# Patient Record
Sex: Male | Born: 1970 | Race: Black or African American | Hispanic: No | Marital: Married | Smoking: Former smoker
Health system: Southern US, Community
[De-identification: ages and names within clinical notes are randomized; demographics above are authoritative.]

## PROBLEM LIST (undated history)

## (undated) DIAGNOSIS — J383 Other diseases of vocal cords: Secondary | ICD-10-CM

## (undated) DIAGNOSIS — Z923 Personal history of irradiation: Secondary | ICD-10-CM

## (undated) DIAGNOSIS — R490 Dysphonia: Secondary | ICD-10-CM

## (undated) DIAGNOSIS — C61 Malignant neoplasm of prostate: Secondary | ICD-10-CM

## (undated) DIAGNOSIS — M1990 Unspecified osteoarthritis, unspecified site: Secondary | ICD-10-CM

## (undated) DIAGNOSIS — R7303 Prediabetes: Secondary | ICD-10-CM

## (undated) DIAGNOSIS — K219 Gastro-esophageal reflux disease without esophagitis: Secondary | ICD-10-CM

## (undated) HISTORY — DX: Gastro-esophageal reflux disease without esophagitis: K21.9

## (undated) HISTORY — PX: COLONOSCOPY: SHX174

## (undated) HISTORY — PX: PROSTATE BIOPSY: SHX241

## (undated) HISTORY — PX: ESOPHAGOGASTRODUODENOSCOPY ENDOSCOPY: SHX5814

## (undated) HISTORY — DX: Personal history of irradiation: Z92.3

---

## 2017-12-05 ENCOUNTER — Emergency Department (HOSPITAL_COMMUNITY): Payer: Self-pay

## 2017-12-05 ENCOUNTER — Emergency Department (HOSPITAL_COMMUNITY)
Admission: EM | Admit: 2017-12-05 | Discharge: 2017-12-05 | Disposition: A | Discharge status: Home / Self Care | Payer: Self-pay | Attending: Emergency Medicine | Admitting: Emergency Medicine

## 2017-12-05 ENCOUNTER — Encounter (HOSPITAL_COMMUNITY): Payer: Self-pay | Admitting: Emergency Medicine

## 2017-12-05 ENCOUNTER — Other Ambulatory Visit: Payer: Self-pay

## 2017-12-05 DIAGNOSIS — R0789 Other chest pain: Secondary | ICD-10-CM | POA: Insufficient documentation

## 2017-12-05 LAB — CBC
HEMATOCRIT: 41.5 % (ref 39.0–52.0)
Hemoglobin: 13.5 g/dL (ref 13.0–17.0)
MCH: 28.8 pg (ref 26.0–34.0)
MCHC: 32.5 g/dL (ref 30.0–36.0)
MCV: 88.7 fL (ref 78.0–100.0)
Platelets: 239 10*3/uL (ref 150–400)
RBC: 4.68 MIL/uL (ref 4.22–5.81)
RDW: 13.8 % (ref 11.5–15.5)
WBC: 4 10*3/uL (ref 4.0–10.5)

## 2017-12-05 LAB — URINALYSIS, ROUTINE W REFLEX MICROSCOPIC
Bilirubin Urine: NEGATIVE
GLUCOSE, UA: NEGATIVE mg/dL
HGB URINE DIPSTICK: NEGATIVE
KETONES UR: NEGATIVE mg/dL
Leukocytes, UA: NEGATIVE
Nitrite: NEGATIVE
PROTEIN: NEGATIVE mg/dL
Specific Gravity, Urine: 1.02 (ref 1.005–1.030)
pH: 9 — ABNORMAL HIGH (ref 5.0–8.0)

## 2017-12-05 LAB — BASIC METABOLIC PANEL
ANION GAP: 11 (ref 5–15)
BUN: 8 mg/dL (ref 6–20)
CALCIUM: 9.5 mg/dL (ref 8.9–10.3)
CO2: 25 mmol/L (ref 22–32)
Chloride: 103 mmol/L (ref 101–111)
Creatinine, Ser: 1.14 mg/dL (ref 0.61–1.24)
GFR calc Af Amer: >60 mL/min (ref >60)
Glucose, Bld: 83 mg/dL (ref 65–99)
POTASSIUM: 4.1 mmol/L (ref 3.5–5.1)
SODIUM: 139 mmol/L (ref 135–145)

## 2017-12-05 LAB — D-DIMER, QUANTITATIVE (NOT AT ARMC)

## 2017-12-05 LAB — I-STAT TROPONIN, ED: Troponin i, poc: 0 ng/mL (ref 0.00–0.08)

## 2017-12-05 MED ORDER — ACETAMINOPHEN 325 MG PO TABS
650.0000 mg | ORAL_TABLET | Freq: Once | ORAL | Status: AC
  Administered 2017-12-05: 650 mg via ORAL
  Filled 2017-12-05: qty 2

## 2017-12-05 NOTE — ED Triage Notes (Signed)
Pt c/o chest discomfort, fever and urinary frequency x 1 week. Denies shortness of breath/nausea/vomiting. No other urinary symptoms.

## 2017-12-05 NOTE — ED Notes (Signed)
Pt not placed on cardiac monitor due to pt placement in hall. Intermittent Sp02 checks only.

## 2017-12-05 NOTE — ED Notes (Signed)
Dr. Sabra Heck requested a bed even if its a hallway bed now.

## 2017-12-05 NOTE — ED Notes (Signed)
Pt verbalized understanding of d/c instructions and f/u care. Pt denied any questions but stated he felt as though he had a fever. Temperature assessed prior to pt leaving ED and found to be 100.7. EDP notified.

## 2017-12-05 NOTE — ED Provider Notes (Signed)
[facility] EMERGENCY DEPARTMENT Provider Note   CSN: [id number] Arrival date & time: 12/05/17  1853     History   Chief Complaint Chief Complaint  Patient presents with  . Chest Pain  . Urinary Frequency    HPI Sandro Burgo is a 47 y.o. male.  Patient is a 47 year old male with a significant medical history for cigarette smoking but otherwise no other medical problems who is presenting today with a spinning sensation in his chest.  He states that he has had it intermittently for the last 1 week but is usually there in the morning when he wakes up.  However it occurred today when he was at work where he felt sweaty and this sensation in his chest like his heart was beating fast.  He denies a cough, chest pain, shortness of breath, nausea, vomiting or abdominal pain.  He denies any headache or neck pain.  He does drink alcohol but states only drinks on the weekend he does not drink regularly.  He has not had any sick contacts that he is aware of.   The history is provided by the patient.    History reviewed. No pertinent past medical history.  There are no active problems to display for this patient.   History reviewed. No pertinent surgical history.     Home Medications    Prior to Admission medications   Not on File    Family History No family history on file.  Social History Social History   Tobacco Use  . Smoking status: Never Smoker  . Smokeless tobacco: Never Used  Substance Use Topics  . Alcohol use: Yes  . Drug use: No     Allergies   Patient has no known allergies.   Review of Systems Review of Systems  Genitourinary: Positive for difficulty urinating.       Difficulty initiating flow of urine but no dysuria or frequency  All other systems reviewed and are negative.    Physical Exam Updated Vital Signs BP 115/78 (BP Location: Right Arm)   Pulse 73   Temp 99.5 F (37.5 C) (Oral)   Resp 16   Ht 5' 7.72" (1.72 m)    SpO2 100%   Physical Exam  Constitutional: He is oriented to person, place, and time. He appears well-developed and well-nourished. No distress.  HENT:  Head: Normocephalic and atraumatic.  Mouth/Throat: Oropharynx is clear and moist.  Eyes: Conjunctivae and EOM are normal. Pupils are equal, round, and reactive to light.  Neck: Normal range of motion. Neck supple.  Cardiovascular: Normal rate, regular rhythm and intact distal pulses.  No murmur heard. Pulmonary/Chest: Effort normal and breath sounds normal. No respiratory distress. He has no wheezes. He has no rales.  Abdominal: Soft. He exhibits no distension. There is no tenderness. There is no rebound and no guarding.  Musculoskeletal: Normal range of motion. He exhibits no edema or tenderness.  Neurological: He is alert and oriented to person, place, and time.  Skin: Skin is warm and dry. No rash noted. No erythema.  Psychiatric: He has a normal mood and affect. His behavior is normal.  Nursing note and vitals reviewed.    ED Treatments / Results  Labs (all labs ordered are listed, but only abnormal results are displayed) Labs Reviewed  URINALYSIS, ROUTINE W REFLEX MICROSCOPIC - Abnormal; Notable for the following components:      Result Value   pH 9.0 (*)    All other components within normal limits  BASIC METABOLIC PANEL  CBC  D-DIMER, QUANTITATIVE (NOT AT [facility])  I-STAT TROPONIN, ED    EKG  EKG Interpretation  Date/Time:  Monday December 05 2017 19:14:23 EST Ventricular Rate:  77 PR Interval:  144 QRS Duration: 90 QT Interval:  346 QTC Calculation: 391 R Axis:   68 Text Interpretation:  Normal sinus rhythm Normal ECG No significant change since last tracing Confirmed by Blanchie Dessert [id number]) on 12/05/2017 7:30:38 PM       Radiology Dg Chest 2 View  Result Date: 12/05/2017 CLINICAL DATA:  Chest pain for 1 week.  Shortness of breath. EXAM: CHEST  2 VIEW COMPARISON:  None. FINDINGS: The heart size and  mediastinal contours are normal. The lungs are clear. There is no pleural effusion or pneumothorax. No acute osseous findings are evident. IMPRESSION: No active cardiopulmonary process. Electronically Signed   By: Richardean Sale M.D.   On: 12/05/2017 20:05    Procedures Procedures (including critical care time)  Medications Ordered in ED Medications - No data to display   Initial Impression / Assessment and Plan / ED Course  I have reviewed the triage vital signs and the nursing notes.  Pertinent labs & imaging results that were available during my care of the patient were reviewed by me and considered in my medical decision making (see chart for details).     47 year old healthy gentleman presenting today with what he describes as a spinning sensation in his chest.  It is been present for the last 1 week intermittently but occurred at work.  He states it never happens with exertion.  Does not seem to be cardiac in nature.  Patient's EKG shows some signs most likely of early repolarization but no prior EKG.  Patient is well-appearing has no other risk factors.  Blood pressure within normal limits.  Low-grade temperature of 99.5 but again he denies any cough or other infectious symptoms.  Symptoms do not seem to be related to eating. Secondly patient is complaining of urinary symptoms which seems more significant for prostatic hypertrophy.  He has difficulty starting his stream but no dysuria or frequency.  CBC, BMP, troponin, chest x-ray, d-dimer pending.  10:57 PM Labs are wnl.  Atypical chest pain.  Low suspicion for ACS.  Possible early viral pathology.  Recommended following up with cardiology if symptoms recur.  Final Clinical Impressions(s) / ED Diagnoses   Final diagnoses:  Atypical chest pain    ED Discharge Orders    None       Blanchie Dessert, MD 12/05/17 2257

## 2018-10-11 DIAGNOSIS — J383 Other diseases of vocal cords: Secondary | ICD-10-CM

## 2018-10-11 DIAGNOSIS — R490 Dysphonia: Secondary | ICD-10-CM

## 2018-10-11 HISTORY — DX: Other diseases of vocal cords: J38.3

## 2018-10-11 HISTORY — DX: Dysphonia: R49.0

## 2018-12-08 DIAGNOSIS — Z720 Tobacco use: Secondary | ICD-10-CM | POA: Insufficient documentation

## 2019-03-16 ENCOUNTER — Ambulatory Visit: Payer: Self-pay | Admitting: Otolaryngology

## 2019-03-16 NOTE — H&P (Signed)
PREOPERATIVE H&P  Chief Complaint: hoarseness for 4 months  HPI: Juan Lewis is a 48 y.o. male who presents for evaluation of hoarseness since February. He smokes half a pack a day for over 20 years. He denies any sore throat or trouble swallowing. He underwent fiberoptic laryngoscopy in the office which revealed a right anterior vocal cord lesion. He is taken to the operating room at this time for microlaryngoscopy and excisional biopsy.  No past medical history on file. No past surgical history on file. Social History   Socioeconomic History  . Marital status: Married    Spouse name: Not on file  . Number of children: Not on file  . Years of education: Not on file  . Highest education level: Not on file  Occupational History  . Not on file  Social Needs  . Financial resource strain: Not on file  . Food insecurity:    Worry: Not on file    Inability: Not on file  . Transportation needs:    Medical: Not on file    Non-medical: Not on file  Tobacco Use  . Smoking status: Never Smoker  . Smokeless tobacco: Never Used  Substance and Sexual Activity  . Alcohol use: Yes  . Drug use: No  . Sexual activity: Not on file  Lifestyle  . Physical activity:    Days per week: Not on file    Minutes per session: Not on file  . Stress: Not on file  Relationships  . Social connections:    Talks on phone: Not on file    Gets together: Not on file    Attends religious service: Not on file    Active member of club or organization: Not on file    Attends meetings of clubs or organizations: Not on file    Relationship status: Not on file  Other Topics Concern  . Not on file  Social History Narrative  . Not on file   No family history on file. No Known Allergies Prior to Admission medications   Not on File     Positive ROS: negative  All other systems have been reviewed and were otherwise negative with the exception of those mentioned in the HPI and as above.  Physical  Exam: There were no vitals filed for this visit.  General: Alert, no acute distress Oral: Normal oral mucosa and tonsils Nasal: Clear nasal passages FOL: patient has an anterior right vocal cord lesionwith normal vocal cord mobility otherwise. Questionable neoplasia versus papilloma. Neck: No palpable adenopathy or thyroid nodules Ear: Ear canal is clear with normal appearing TMs Cardiovascular: Regular rate and rhythm, no murmur.  Respiratory: Clear to auscultation Neurologic: Alert and oriented x 3   Assessment/Plan: hoarseness Plan for Procedure(s): MICROLARYNGOSCOPY WITH EXCISION OF VOCAL CORD LESION   Melony Overly, MD 03/16/2019 4:43 PM

## 2019-03-19 ENCOUNTER — Encounter (HOSPITAL_BASED_OUTPATIENT_CLINIC_OR_DEPARTMENT_OTHER): Payer: Self-pay | Admitting: *Deleted

## 2019-03-19 ENCOUNTER — Other Ambulatory Visit: Payer: Self-pay

## 2019-03-20 ENCOUNTER — Other Ambulatory Visit (HOSPITAL_COMMUNITY)
Admission: RE | Admit: 2019-03-20 | Discharge: 2019-03-20 | Disposition: A | Discharge status: Home / Self Care | Payer: HRSA Program | Source: Ambulatory Visit | Attending: Otolaryngology | Admitting: Otolaryngology

## 2019-03-20 DIAGNOSIS — Z1159 Encounter for screening for other viral diseases: Secondary | ICD-10-CM | POA: Insufficient documentation

## 2019-03-20 DIAGNOSIS — Z01812 Encounter for preprocedural laboratory examination: Secondary | ICD-10-CM | POA: Diagnosis present

## 2019-03-21 LAB — NOVEL CORONAVIRUS, NAA (HOSP ORDER, SEND-OUT TO REF LAB; TAT 18-24 HRS): SARS-CoV-2, NAA: NOT DETECTED

## 2019-03-23 ENCOUNTER — Ambulatory Visit (HOSPITAL_BASED_OUTPATIENT_CLINIC_OR_DEPARTMENT_OTHER): Payer: Self-pay | Admitting: Anesthesiology

## 2019-03-23 ENCOUNTER — Ambulatory Visit (HOSPITAL_BASED_OUTPATIENT_CLINIC_OR_DEPARTMENT_OTHER)
Admission: RE | Admit: 2019-03-23 | Discharge: 2019-03-23 | Disposition: A | Discharge status: Home / Self Care | Payer: Self-pay | Attending: Otolaryngology | Admitting: Otolaryngology

## 2019-03-23 ENCOUNTER — Encounter (HOSPITAL_BASED_OUTPATIENT_CLINIC_OR_DEPARTMENT_OTHER): Payer: Self-pay

## 2019-03-23 ENCOUNTER — Other Ambulatory Visit: Payer: Self-pay

## 2019-03-23 ENCOUNTER — Encounter (HOSPITAL_BASED_OUTPATIENT_CLINIC_OR_DEPARTMENT_OTHER)
Admission: RE | Disposition: A | Discharge status: Home / Self Care | Payer: Self-pay | Source: Home / Self Care | Attending: Otolaryngology

## 2019-03-23 DIAGNOSIS — C320 Malignant neoplasm of glottis: Secondary | ICD-10-CM | POA: Insufficient documentation

## 2019-03-23 DIAGNOSIS — F17210 Nicotine dependence, cigarettes, uncomplicated: Secondary | ICD-10-CM | POA: Insufficient documentation

## 2019-03-23 HISTORY — PX: MICROLARYNGOSCOPY: SHX5208

## 2019-03-23 HISTORY — DX: Other diseases of vocal cords: J38.3

## 2019-03-23 HISTORY — DX: Dysphonia: R49.0

## 2019-03-23 SURGERY — MICROLARYNGOSCOPY
Anesthesia: General | Site: Throat

## 2019-03-23 MED ORDER — MIDAZOLAM HCL 2 MG/2ML IJ SOLN
1.0000 mg | INTRAMUSCULAR | Status: DC | PRN
  Administered 2019-03-23: 09:00:00 2 mg via INTRAVENOUS

## 2019-03-23 MED ORDER — EPINEPHRINE PF 1 MG/ML IJ SOLN
INTRAMUSCULAR | Status: AC
  Filled 2019-03-23: qty 1

## 2019-03-23 MED ORDER — SCOPOLAMINE 1 MG/3DAYS TD PT72: 1.0000 | MEDICATED_PATCH | Freq: Once | TRANSDERMAL | Status: DC | PRN

## 2019-03-23 MED ORDER — ROCURONIUM BROMIDE 100 MG/10ML IV SOLN
INTRAVENOUS | Status: DC | PRN
  Administered 2019-03-23: 30 mg via INTRAVENOUS

## 2019-03-23 MED ORDER — SUGAMMADEX SODIUM 200 MG/2ML IV SOLN
INTRAVENOUS | Status: DC | PRN
  Administered 2019-03-23: 200 mg via INTRAVENOUS

## 2019-03-23 MED ORDER — CEFAZOLIN SODIUM-DEXTROSE 2-4 GM/100ML-% IV SOLN
2.0000 g | INTRAVENOUS | Status: AC
  Administered 2019-03-23: 2 g via INTRAVENOUS

## 2019-03-23 MED ORDER — SUCCINYLCHOLINE CHLORIDE 20 MG/ML IJ SOLN
INTRAMUSCULAR | Status: DC | PRN
  Administered 2019-03-23: 140 mg via INTRAVENOUS

## 2019-03-23 MED ORDER — PROMETHAZINE HCL 25 MG/ML IJ SOLN: 6.2500 mg | INTRAMUSCULAR | Status: DC | PRN

## 2019-03-23 MED ORDER — CHLORHEXIDINE GLUCONATE CLOTH 2 % EX PADS: 6.0000 | MEDICATED_PAD | Freq: Once | CUTANEOUS | Status: DC

## 2019-03-23 MED ORDER — EPINEPHRINE PF 1 MG/ML IJ SOLN
INTRAMUSCULAR | Status: DC | PRN
  Administered 2019-03-23: 1 mg

## 2019-03-23 MED ORDER — FENTANYL CITRATE (PF) 100 MCG/2ML IJ SOLN
50.0000 ug | INTRAMUSCULAR | Status: DC | PRN
  Administered 2019-03-23: 100 ug via INTRAVENOUS

## 2019-03-23 MED ORDER — FENTANYL CITRATE (PF) 100 MCG/2ML IJ SOLN: 25.0000 ug | INTRAMUSCULAR | Status: DC | PRN

## 2019-03-23 MED ORDER — MEPERIDINE HCL 25 MG/ML IJ SOLN: 6.2500 mg | INTRAMUSCULAR | Status: DC | PRN

## 2019-03-23 MED ORDER — METHYLENE BLUE 0.5 % INJ SOLN
INTRAVENOUS | Status: AC
  Filled 2019-03-23: qty 10

## 2019-03-23 MED ORDER — MIDAZOLAM HCL 2 MG/2ML IJ SOLN: 0.5000 mg | Freq: Once | INTRAMUSCULAR | Status: DC | PRN

## 2019-03-23 MED ORDER — SODIUM CHLORIDE (PF) 0.9 % IJ SOLN
INTRAMUSCULAR | Status: AC
  Filled 2019-03-23: qty 10

## 2019-03-23 MED ORDER — PROPOFOL 10 MG/ML IV BOLUS
INTRAVENOUS | Status: DC | PRN
  Administered 2019-03-23: 100 mg via INTRAVENOUS

## 2019-03-23 MED ORDER — FENTANYL CITRATE (PF) 100 MCG/2ML IJ SOLN
INTRAMUSCULAR | Status: AC
  Filled 2019-03-23: qty 2

## 2019-03-23 MED ORDER — MIDAZOLAM HCL 2 MG/2ML IJ SOLN
INTRAMUSCULAR | Status: AC
  Filled 2019-03-23: qty 2

## 2019-03-23 MED ORDER — OMEPRAZOLE 40 MG PO CPDR: 40.0000 mg | DELAYED_RELEASE_CAPSULE | Freq: Every day | ORAL | 1 refills | Status: DC

## 2019-03-23 MED ORDER — DEXAMETHASONE SODIUM PHOSPHATE 4 MG/ML IJ SOLN
INTRAMUSCULAR | Status: DC | PRN
  Administered 2019-03-23: 10 mg via INTRAVENOUS

## 2019-03-23 MED ORDER — LIDOCAINE HCL (CARDIAC) PF 100 MG/5ML IV SOSY
PREFILLED_SYRINGE | INTRAVENOUS | Status: DC | PRN
  Administered 2019-03-23: 50 mg via INTRAVENOUS

## 2019-03-23 MED ORDER — LACTATED RINGERS IV SOLN
INTRAVENOUS | Status: DC
  Administered 2019-03-23: 08:00:00 via INTRAVENOUS

## 2019-03-23 SURGICAL SUPPLY — 36 items
BNDG EYE OVAL (GAUZE/BANDAGES/DRESSINGS) ×8 IMPLANT
CANISTER SUCT 1200ML W/VALVE (MISCELLANEOUS) ×4 IMPLANT
COVER WAND RF STERILE (DRAPES) IMPLANT
DEPRESSOR TONGUE BLADE STERILE (MISCELLANEOUS) ×4 IMPLANT
FILTER 7/8 IN (FILTER) IMPLANT
GAUZE SPONGE 4X4 12PLY STRL LF (GAUZE/BANDAGES/DRESSINGS) ×8 IMPLANT
GLOVE BIO SURGEON STRL SZ 6.5 (GLOVE) ×3 IMPLANT
GLOVE BIO SURGEON STRL SZ7.5 (GLOVE) ×4 IMPLANT
GLOVE BIO SURGEONS STRL SZ 6.5 (GLOVE) ×1
GLOVE BIOGEL PI IND STRL 7.0 (GLOVE) ×2 IMPLANT
GLOVE BIOGEL PI IND STRL 7.5 (GLOVE) ×4 IMPLANT
GLOVE BIOGEL PI INDICATOR 7.0 (GLOVE) ×2
GLOVE BIOGEL PI INDICATOR 7.5 (GLOVE) ×4
GLOVE SS BIOGEL STRL SZ 7.5 (GLOVE) ×2 IMPLANT
GLOVE SUPERSENSE BIOGEL SZ 7.5 (GLOVE) ×2
GOWN STRL REUS W/ TWL LRG LVL3 (GOWN DISPOSABLE) IMPLANT
GOWN STRL REUS W/ TWL XL LVL3 (GOWN DISPOSABLE) ×4 IMPLANT
GOWN STRL REUS W/TWL LRG LVL3 (GOWN DISPOSABLE)
GOWN STRL REUS W/TWL XL LVL3 (GOWN DISPOSABLE) ×4
GUARD TEETH (MISCELLANEOUS) ×4 IMPLANT
MARKER SKIN DUAL TIP RULER LAB (MISCELLANEOUS) IMPLANT
NDL SAFETY ECLIPSE 18X1.5 (NEEDLE) ×2 IMPLANT
NEEDLE HYPO 18GX1.5 SHARP (NEEDLE) ×2
NEEDLE SPNL 22GX7 QUINCKE BK (NEEDLE) IMPLANT
NS IRRIG 1000ML POUR BTL (IV SOLUTION) ×4 IMPLANT
PACK BASIN DAY SURGERY FS (CUSTOM PROCEDURE TRAY) ×4 IMPLANT
PATTIES SURGICAL .5 X3 (DISPOSABLE) ×4 IMPLANT
REDUCTION FITTING 1/4 IN (FILTER) IMPLANT
SHEET MEDIUM DRAPE 40X70 STRL (DRAPES) ×4 IMPLANT
SLEEVE SCD COMPRESS KNEE MED (MISCELLANEOUS) ×4 IMPLANT
SOLUTION BUTLER CLEAR DIP (MISCELLANEOUS) ×4 IMPLANT
SYR 5ML LL (SYRINGE) ×4 IMPLANT
SYR CONTROL 10ML LL (SYRINGE) IMPLANT
TOWEL GREEN STERILE FF (TOWEL DISPOSABLE) ×4 IMPLANT
TUBE CONNECTING 20'X1/4 (TUBING) ×2
TUBE CONNECTING 20X1/4 (TUBING) ×6 IMPLANT

## 2019-03-23 NOTE — Transfer of Care (Signed)
Immediate Anesthesia Transfer of Care Note  Patient: Juan Lewis  Procedure(s) Performed: MICROLARYNGOSCOPY WITH EXCISION OF VOCAL CORD LESION (N/A Throat)  Patient Location: PACU  Anesthesia Type:General  Level of Consciousness: awake, alert  and oriented  Airway & Oxygen Therapy: Patient Spontanous Breathing and Patient connected to face mask oxygen  Post-op Assessment: Report given to RN and Post -op Vital signs reviewed and stable  Post vital signs: Reviewed and stable  Last Vitals:  Vitals Value Taken Time  BP    Temp    Pulse 68 03/23/19 1006  Resp 15 03/23/19 1006  SpO2 100 % 03/23/19 1006  Vitals shown include unvalidated device data.  Last Pain:  Vitals:   03/23/19 0733  TempSrc: Oral  PainSc: 0-No pain         Complications: No apparent anesthesia complications

## 2019-03-23 NOTE — Brief Op Note (Signed)
03/23/2019  9:54 AM  PATIENT:  Juan Lewis  48 y.o. male  PRE-OPERATIVE DIAGNOSIS:  hoarseness  POST-OPERATIVE DIAGNOSIS:  hoarseness  PROCEDURE:  Procedure(s): MICROLARYNGOSCOPY WITH EXCISION OF VOCAL CORD LESION (N/A)  SURGEON:  Surgeon(s) and Role:    Rozetta Nunnery, MD - Primary  PHYSICIAN ASSISTANT:   ASSISTANTS: none   ANESTHESIA:   general  EBL:  minimal   BLOOD ADMINISTERED:none  DRAINS: none   LOCAL MEDICATIONS USED:  NONE  SPECIMEN:  Source of Specimen:  right vocal cord lesion  DISPOSITION OF SPECIMEN:  PATHOLOGY  COUNTS:  YES  TOURNIQUET:  * No tourniquets in log *  DICTATION: .Other Dictation: Dictation Number [id number]  PLAN OF CARE: Discharge to home after PACU  PATIENT DISPOSITION:  PACU - hemodynamically stable.   Delay start of Pharmacological VTE agent (>24hrs) due to surgical blood loss or risk of bleeding: yes

## 2019-03-23 NOTE — Discharge Instructions (Addendum)
Rest voice. Tylenol or ibuprofen prn pain Prilosec or omeprazole 40 mg qd for the next 2 weeks Take your regular meds   Post Anesthesia Home Care Instructions  Activity: Get plenty of rest for the remainder of the day. A responsible individual must stay with you for 24 hours following the procedure.  For the next 24 hours, DO NOT: -Drive a car -Paediatric nurse -Drink alcoholic beverages -Take any medication unless instructed by your physician -Make any legal decisions or sign important papers.  Meals: Start with liquid foods such as gelatin or soup. Progress to regular foods as tolerated. Avoid greasy, spicy, heavy foods. If nausea and/or vomiting occur, drink only clear liquids until the nausea and/or vomiting subsides. Call your physician if vomiting continues.  Special Instructions/Symptoms: Your throat may feel dry or sore from the anesthesia or the breathing tube placed in your throat during surgery. If this causes discomfort, gargle with warm salt water. The discomfort should disappear within 24 hours.  If you had a scopolamine patch placed behind your ear for the management of post- operative nausea and/or vomiting:  1. The medication in the patch is effective for 72 hours, after which it should be removed.  Wrap patch in a tissue and discard in the trash. Wash hands thoroughly with soap and water. 2. You may remove the patch earlier than 72 hours if you experience unpleasant side effects which may include dry mouth, dizziness or visual disturbances. 3. Avoid touching the patch. Wash your hands with soap and water after contact with the patch.

## 2019-03-23 NOTE — Anesthesia Postprocedure Evaluation (Signed)
Anesthesia Post Note  Patient: Juan Lewis  Procedure(s) Performed: MICROLARYNGOSCOPY WITH EXCISION OF VOCAL CORD LESION (N/A Throat)     Patient location during evaluation: PACU Anesthesia Type: General Level of consciousness: awake and alert, patient cooperative and oriented Pain management: pain level controlled Vital Signs Assessment: post-procedure vital signs reviewed and stable Respiratory status: spontaneous breathing, nonlabored ventilation and respiratory function stable Cardiovascular status: blood pressure returned to baseline and stable Postop Assessment: no apparent nausea or vomiting Anesthetic complications: no    Last Vitals:  Vitals:   03/23/19 1037 03/23/19 1057  BP:  (!) 143/98  Pulse: 72 70  Resp: 17 18  Temp:  36.4 C  SpO2: 97% 98%    Last Pain:  Vitals:   03/23/19 1057  TempSrc:   PainSc: 0-No pain                 Cathryne Mancebo,E. Orbin Mayeux

## 2019-03-23 NOTE — Anesthesia Procedure Notes (Signed)
Procedure Name: Intubation Date/Time: 03/23/2019 9:05 AM Performed by: Willa Frater, CRNA Pre-anesthesia Checklist: Patient identified, Emergency Drugs available, Suction available and Patient being monitored Patient Re-evaluated:Patient Re-evaluated prior to induction Oxygen Delivery Method: Circle system utilized Preoxygenation: Pre-oxygenation with 100% oxygen Induction Type: IV induction Ventilation: Mask ventilation without difficulty Laryngoscope Size: Mac and 3 Grade View: Grade I Tube type: Oral Tube size: 6.0 mm Number of attempts: 1 Airway Equipment and Method: Stylet and Oral airway Placement Confirmation: ETT inserted through vocal cords under direct vision,  positive ETCO2 and breath sounds checked- equal and bilateral Secured at: 24 cm Tube secured with: Tape Dental Injury: Teeth and Oropharynx as per pre-operative assessment

## 2019-03-23 NOTE — Anesthesia Preprocedure Evaluation (Addendum)
Anesthesia Evaluation  Patient identified by MRN, date of birth, ID band Patient awake    Reviewed: Allergy & Precautions, NPO status , Patient's Chart, lab work & pertinent test results  History of Anesthesia Complications Negative for: history of anesthetic complications  Airway Mallampati: I  TM Distance: >3 FB Neck ROM: Full    Dental  (+) Dental Advisory Given   Pulmonary Current Smoker,  Hoarseness with vocal cord lesion 03/20/2019 novel coronavirus NEG   breath sounds clear to auscultation       Cardiovascular negative cardio ROS   Rhythm:Regular Rate:Normal     Neuro/Psych negative neurological ROS     GI/Hepatic negative GI ROS, Neg liver ROS,   Endo/Other  negative endocrine ROS  Renal/GU negative Renal ROS     Musculoskeletal   Abdominal   Peds  Hematology negative hematology ROS (+)   Anesthesia Other Findings   Reproductive/Obstetrics                            Anesthesia Physical Anesthesia Plan  ASA: II  Anesthesia Plan: General   Post-op Pain Management:    Induction: Intravenous  PONV Risk Score and Plan: 1 and Ondansetron and Dexamethasone  Airway Management Planned: Oral ETT  Additional Equipment:   Intra-op Plan:   Post-operative Plan: Extubation in OR  Informed Consent: I have reviewed the patients History and Physical, chart, labs and discussed the procedure including the risks, benefits and alternatives for the proposed anesthesia with the patient or authorized representative who has indicated his/her understanding and acceptance.     Dental advisory given  Plan Discussed with: CRNA and Surgeon  Anesthesia Plan Comments: (Plan routine monitors, GETA)       Anesthesia Quick Evaluation

## 2019-03-23 NOTE — Op Note (Signed)
Juan Lewis, THAIN MEDICAL RECORD YN:[id number] ACCOUNT [id number] DATE OF BIRTH:1971/02/10 FACILITY: [facility] LOCATION: [facility] PHYSICIAN:CHRISTOPHER Lincoln Maxin, MD  OPERATIVE REPORT  DATE OF PROCEDURE:  03/23/2019  PREOPERATIVE DIAGNOSIS:  Hoarseness with right vocal cord lesion.  POSTOPERATIVE DIAGNOSIS:  Hoarseness with right vocal cord lesion.  OPERATION PERFORMED:  Microlaryngoscopy with biopsy of right vocal cord lesion.  SURGEON:  Melony Overly, MD  ANESTHESIA:  General endotracheal.  COMPLICATIONS:  None.  BRIEF CLINICAL NOTE:  The patient is a 48 year old gentleman who has been hoarse now for a little over 3 months.  On exam in the office, he has a right anterior vocal cord lesion with otherwise normal vocal cord mobility.  The lesion in the office  laryngoscopy appears exophytic or papillomatous but may represent a cancer as the patient is a smoker.  He was taken to the operating room at this time for a direct laryngoscopy and biopsy.  DESCRIPTION OF PROCEDURE:  After adequate endotracheal anesthesia, microlaryngoscopy was performed.  The base of tongue, vallecula and epiglottis were normal.  AE folds were clear.  Both piriform sinuses were clear.  Evaluation of the endolarynx:  Left  vocal cord was clear all the way up to the anterior commissure.  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  It extended all the way up to the anterior commissure but  did not really appear to cross the anterior commissure.  This was fairly bulky and extended back into the ventricle region.  Photos were obtained and a generous biopsy was obtained excising the exophytic portion of the tumor and sent to pathology.   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  Topical adrenaline was used for hemostasis.  Subglottic area was clear.  This completed the procedure.  The patient was  subsequently awoken from anesthesia and transferred to recovery  room and postop doing well.  DISPOSITION:  The patient will be discharged home later this morning on omeprazole 40 mg daily for 3 weeks.  He will be presented at tumor board and will likely require radiation therapy to treat this.  He will follow up in my office in 9-10 days for  recheck.  LN/NUANCE  D:03/23/2019 T:03/23/2019 JOB:[id number]

## 2019-03-23 NOTE — Interval H&P Note (Signed)
History and Physical Interval Note:  03/23/2019 8:42 AM  Juan Lewis  has presented today for surgery, with the diagnosis of hoarseness.  The various methods of treatment have been discussed with the patient and family. After consideration of risks, benefits and other options for treatment, the patient has consented to  Procedure(s): MICROLARYNGOSCOPY WITH EXCISION OF VOCAL CORD LESION (N/A) as a surgical intervention.  The patient's history has been reviewed, patient examined, no change in status, stable for surgery.  I have reviewed the patient's chart and labs.  Questions were answered to the patient's satisfaction.     Melony Overly

## 2019-03-26 ENCOUNTER — Encounter (HOSPITAL_BASED_OUTPATIENT_CLINIC_OR_DEPARTMENT_OTHER): Payer: Self-pay | Admitting: Otolaryngology

## 2019-03-28 ENCOUNTER — Encounter: Payer: Self-pay | Admitting: Radiation Oncology

## 2019-03-28 DIAGNOSIS — C320 Malignant neoplasm of glottis: Secondary | ICD-10-CM | POA: Insufficient documentation

## 2019-04-02 ENCOUNTER — Other Ambulatory Visit: Payer: Self-pay | Admitting: Otolaryngology

## 2019-04-02 DIAGNOSIS — C320 Malignant neoplasm of glottis: Secondary | ICD-10-CM

## 2019-04-03 ENCOUNTER — Other Ambulatory Visit: Payer: Self-pay

## 2019-04-03 ENCOUNTER — Telehealth: Payer: Self-pay | Admitting: *Deleted

## 2019-04-03 ENCOUNTER — Ambulatory Visit
Admission: RE | Admit: 2019-04-03 | Discharge: 2019-04-03 | Disposition: A | Discharge status: Home / Self Care | Payer: Self-pay | Source: Ambulatory Visit | Attending: Otolaryngology | Admitting: Otolaryngology

## 2019-04-03 DIAGNOSIS — C320 Malignant neoplasm of glottis: Secondary | ICD-10-CM

## 2019-04-03 MED ORDER — IOPAMIDOL (ISOVUE-300) INJECTION 61%
75.0000 mL | Freq: Once | INTRAVENOUS | Status: AC | PRN
  Administered 2019-04-03: 75 mL via INTRAVENOUS

## 2019-04-03 NOTE — Telephone Encounter (Signed)
Oncology Nurse Navigator Documentation  Placed introductory call to new referral patient Juan Lewis.  Unable to reach him at numbers listed, sent him secure e-mail asking him to call me.  Gayleen Orem, RN, BSN Head & Neck Oncology Nurse [place] at [place] [phone]

## 2019-04-06 ENCOUNTER — Telehealth: Payer: Self-pay | Admitting: *Deleted

## 2019-04-06 NOTE — Telephone Encounter (Signed)
Oncology Nurse Navigator Documentation  Spoke with patient wife re new referral, inability to contact husband at listed phone number.  She indicated she wd call me after 3:00 this afternoon when she returns home from work.  Addendum 3543:  No return call.  LVMM with wife indicating husband has 6/30 appt with Dr. Isidore Moos, [place] arrival to [facility]. Requested return call confirming message receipt.  Gayleen Orem, RN, BSN Head & Neck Oncology Nurse [place] at [place] [phone]

## 2019-04-09 ENCOUNTER — Telehealth: Payer: Self-pay | Admitting: *Deleted

## 2019-04-09 NOTE — Telephone Encounter (Signed)
Oncology Nurse Navigator Documentation  Placed introductory call to new referral patient Juan Lewis.  Introduced myself as the H&N oncology nurse navigator that works with Dr. Isidore Moos to whom he has been referred by Dr. Lucia Gaskins.  He confirmed understanding of referral.  Briefly explained my role as his navigator, provided my contact information.   Confirmed understanding of tomorrow's WebEx consult with Dr. Isidore Moos.   He verbalized understanding of information provided.  Gayleen Orem, RN, BSN Head & Neck Oncology Nurse [place] at [place] [phone]

## 2019-04-10 ENCOUNTER — Encounter: Payer: Self-pay | Admitting: Radiation Oncology

## 2019-04-10 ENCOUNTER — Ambulatory Visit
Admission: RE | Admit: 2019-04-10 | Discharge: 2019-04-10 | Disposition: A | Discharge status: Home / Self Care | Payer: Self-pay | Source: Ambulatory Visit | Attending: Radiation Oncology | Admitting: Radiation Oncology

## 2019-04-10 ENCOUNTER — Encounter: Payer: Self-pay | Admitting: *Deleted

## 2019-04-10 DIAGNOSIS — C320 Malignant neoplasm of glottis: Secondary | ICD-10-CM

## 2019-04-10 NOTE — Progress Notes (Signed)
Radiation Oncology         [phone] ________________________________  Initial Consultation - WEB EX  Name: Juan Lewis MRN: [id number]  Date: 04/10/2019  DOB: May 20, 1971  IO:[id number], No Pcp Per  Alphonsa Overall, MD   REFERRING PHYSICIAN: Alphonsa Overall, MD  DIAGNOSIS:    ICD-10-CM   1. 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 male who presented with hoarseness for 4 months.  Subsequently, the patient saw Dr. Lucia Gaskins who performed fiberoptic laryngoscopy which revealed a right anterior vocal cord lesion. The patient then underwent microlaryngoscopy with excision of the right vocal cord lesion. "Left vocal cord was clear all the way up to the anterior commissure.  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  It extended all the way up to the anterior commissure but did not really appear to cross the anterior commissure.  This was fairly bulky and extended back into the ventricle region." See photo below in P.E. section.  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 p16 immunohistochemistry is NEGATIVE (weak, patchy staining).  Pertinent imaging thus far includes CT Neck performed on 04/03/19 revealing the right vocal cord lesion, measuring approximately 8 x 4 mm, consistent with known squamous cell carcinoma. No evidence of metastatic disease in the neck.   CT Chest performed same day did not show any pulmonary nodules or mediastinal  adenopathy. No evidence of thoracic metastasis. I have review his images.  Pain status: none  Other symptoms: denies  Tobacco history, if any: He has smoked 1/2 pack daily for over 20 years but quit after biopsy.  ETOH abuse, if any: He drinks alcohol socially.  Prior cancers, if any: denies  PREVIOUS RADIATION THERAPY: No  PAST MEDICAL HISTORY:  has a past medical history of Hoarseness and Lesion of vocal cord.    PAST SURGICAL HISTORY: Past Surgical History:  Procedure Laterality Date  . MICROLARYNGOSCOPY N/A 03/23/2019   Procedure: MICROLARYNGOSCOPY WITH EXCISION OF VOCAL CORD LESION;  Surgeon: Rozetta Nunnery, MD;  Location: [facility];  Service: ENT;  Laterality: N/A;    FAMILY HISTORY: family history is not on file.  SOCIAL HISTORY:  reports that he has been smoking. He has been smoking about 0.50 packs per day. He has never used smokeless tobacco. He reports current alcohol use. He reports that he does not use drugs.  He has quit smoking.  ALLERGIES: Patient has no known allergies.  MEDICATIONS:  Current Outpatient Medications  Medication Sig Dispense Refill  . loratadine (CLARITIN) 10 MG tablet TK 1 T PO QD IN THE MORNING    . naproxen (NAPROSYN) 500 MG tablet TK 1 T PO BID PRN    . omeprazole (PRILOSEC) 40 MG capsule Take 1 capsule (40 mg total) by mouth daily for 14 days. 14 capsule 1   No current facility-administered medications for this encounter.     REVIEW OF SYSTEMS:  Notable for that above.  PHYSICAL EXAM:  vitals were not taken for this visit.   General: Alert and oriented, in no acute distress Psychiatric: Judgment and insight are intact. Affect is appropriate. Photo below from Dr Lucia Gaskins    ECOG = 0  0 - Asymptomatic (Fully active, able to carry on all predisease activities without restriction)  1 - Symptomatic but completely ambulatory (Restricted in physically strenuous activity but ambulatory and able to carry out work of  a light or sedentary nature. For example, light housework, office work)  2 - Symptomatic, <50% in bed during the day (Ambulatory and capable of all self care but unable to carry out any work activities. Up and about more than 50% of waking hours)  3 - Symptomatic, >50% in bed, but not bedbound (Capable of only limited self-care, confined to bed or chair 50% or more of waking hours)  4 - Bedbound (Completely disabled. Cannot carry on any self-care. Totally confined to bed or chair)  5 - Death   Eustace Pen MM, Creech RH, Tormey DC, et al. [phone]). "Toxicity and response criteria of the [facility]". [place] Oncol. 5 (6): 649-55   LABORATORY DATA:  Lab Results  Component Value Date   WBC 4.0 12/05/2017   HGB 13.5 12/05/2017   HCT 41.5 12/05/2017   MCV 88.7 12/05/2017   PLT 239 12/05/2017   CMP     Component Value Date/Time   NA 139 12/05/2017 1910   K 4.1 12/05/2017 1910   CL 103 12/05/2017 1910   CO2 25 12/05/2017 1910   GLUCOSE 83 12/05/2017 1910   BUN 8 12/05/2017 1910   CREATININE 1.14 12/05/2017 1910   CALCIUM 9.5 12/05/2017 1910   GFRNONAA >60 12/05/2017 1910   GFRAA >60 12/05/2017 1910      No results found for: TSH   RADIOGRAPHY: Ct Soft Tissue Neck W Contrast  Result Date: 04/03/2019 CLINICAL DATA:  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 EXAM: CT NECK WITH CONTRAST TECHNIQUE: Multidetector CT imaging of the neck was performed using the standard protocol following the bolus administration of intravenous contrast. CONTRAST:  35mL ISOVUE-300 IOPAMIDOL (ISOVUE-300) INJECTION 61% COMPARISON:  None. FINDINGS: Pharynx and larynx: Symmetric pharyngeal soft tissues. Approximately 8 x 4 mm enhancing lesion involving the right true vocal cord anteriorly with extension to the anterior commissure but without evidence of extension across the midline. Patent airway. Salivary glands: No inflammation, mass, or stone. Thyroid: Unremarkable. Lymph nodes: No  enlarged or suspicious lymph nodes in the neck. Vascular: Major vascular structures of the neck are patent. Limited intracranial: Unremarkable. Visualized orbits: Unremarkable. Mastoids and visualized paranasal sinuses: Mild right greater than left maxillary sinus mucosal thickening, polypoid on the right. Clear mastoid air cells. Skeleton: No suspicious osseous lesion. Upper chest: Clear lung apices. Other: None. IMPRESSION: 1. Right vocal cord lesion consistent with known squamous cell carcinoma. 2. No evidence of metastatic disease in the neck. Electronically Signed   By: Logan Bores M.D.   On: 04/03/2019 14:35   Ct Chest W Contrast  Result Date: 04/03/2019 CLINICAL DATA:  Squamous cell carcinoma RIGHT vocal cord EXAM: CT CHEST WITH CONTRAST TECHNIQUE: Multidetector CT imaging of the chest was performed during intravenous contrast administration. CONTRAST:  34mL ISOVUE-300 IOPAMIDOL (ISOVUE-300) INJECTION 61% COMPARISON:  None. FINDINGS: Cardiovascular: No significant vascular findings. Normal heart size. No pericardial effusion. Mediastinum/Nodes: No axillary or supraclavicular adenopathy. No mediastinal hilar adenopathy. Esophagus normal. Small amount of residual thymic tissue in the anterior mediastinum Lungs/Pleura: No suspicious  pulmonary nodules. Band of atelectasis in the RIGHT middle lobe. Ground-glass densities at the lung bases also favored atelectasis. Upper Abdomen: Limited view of the liver, kidneys, pancreas are unremarkable. Normal adrenal glands. Musculoskeletal: No aggressive osseous lesion. IMPRESSION: No pulmonary nodules or mediastinal adenopathy. No evidence of thoracic metastasis. Small amount residual thymus in the anterior mediastinum Electronically Signed   By: Suzy Bouchard M.D.   On: 04/03/2019 16:59      IMPRESSION/PLAN:  This is a delightful patient with head and neck cancer. I recommend radiotherapy for this patient. He was discussed at tumor board with consensus for a 6  week course of RT.  We discussed the potential risks, benefits, and side effects of radiotherapy. We talked in detail about acute and late effects. We discussed that some of the most bothersome acute effects may be mucositis, salivary changes, skin irritation, hair loss, dehydration, weight loss. We talked about late effects which include but are not necessarily limited to dysphagia, hypothyroidism, permanent injury to larynx or other soft tissue. No guarantees of treatment were given.  The patient is enthusiastic about proceeding with treatment. I look forward to participating in the patient's care.    Simulation (treatment planning) will take place in the next week  We also discussed that the treatment of head and neck cancer is a multidisciplinary process to maximize treatment outcomes and quality of life. For this reasons the following referrals have been or will be made:    Nutritionist for nutrition support during and after treatment.   Speech language pathology for swallowing and/or speech therapy.   Social work for social support.    Baseline labs including TSH.  The patient used to smoke. The patient was counseled to stop using tobacco for life to reduce cancer relapse or second cancers or intolerance to treatment.  Advised to quit ETOH or at least limit consumption.  This encounter was provided by telemedicine platform Webex. Due to pandemic risks  The patient has given verbal consent for this type of encounter and has been advised to only accept a meeting of this type in a secure network environment. The time spent during this encounter was 25 minutes. The attendants for this meeting include Eppie Gibson  and [facility].  During the encounter, Eppie Gibson was located at [facility] Radiation Oncology Department.  Juan Lewis was located at home.   __________________________________________   Eppie Gibson, MD  This document serves as a record of  services personally performed by Eppie Gibson, MD. It was created on her behalf by Rae Lips, a trained medical scribe. The creation of this record is based on the scribe's personal observations and the provider's statements to them. This document has been checked and approved by the attending provider.

## 2019-04-10 NOTE — Progress Notes (Signed)
Oncology Nurse Navigator Documentation  Met with patient during WebEx initial consult with Dr. Isidore Moos.  He was accompanied by his son Juan Lewis.   . Further introduced myself as his Navigator, explained my role as a member of the Care Team.   . Provided introductory explanation of radiation treatment including SIM planning and purpose of Aquaplast head and shoulder mask, showed them example.   [name] He reported:  Moved to [place] about 2 1/2 years ago from [place].  Transportation for appts not a problem, has own car.  Currently not working.  20 yr smoker, quit after recent bx.  He voiced understanding he will be contacted with appt for CT SIM following this morning's consult. . I encouraged them to contact me with questions/concerns as treatments/procedures begin.  They verbalized understanding of information provided.    Addendum 1318:  Called pt to inform of CT SIM tomorrow, 0800.  He confirmed understanding of [place] location, I explained arrival/registration procedures.    Gayleen Orem, RN, BSN Head & Neck Oncology Nurse [place] at [place] [phone]

## 2019-04-11 ENCOUNTER — Ambulatory Visit
Admission: RE | Admit: 2019-04-11 | Discharge: 2019-04-11 | Disposition: A | Discharge status: Home / Self Care | Payer: Self-pay | Source: Ambulatory Visit | Attending: Radiation Oncology | Admitting: Radiation Oncology

## 2019-04-11 ENCOUNTER — Other Ambulatory Visit: Payer: Self-pay

## 2019-04-11 ENCOUNTER — Encounter: Payer: Self-pay | Admitting: *Deleted

## 2019-04-11 DIAGNOSIS — C320 Malignant neoplasm of glottis: Secondary | ICD-10-CM | POA: Insufficient documentation

## 2019-04-11 NOTE — Progress Notes (Signed)
  Radiation Oncology         [phone] ________________________________  Name: Juan Lewis MRN: [id number]  Date: 04/11/2019  DOB: [birth year]  SIMULATION AND TREATMENT PLANNING NOTE  Outpatient    ICD-10-CM   1. Malignant neoplasm of glottis ([place])  C32.0     NARRATIVE:  The patient was brought to the [place].  Identity was confirmed.  All relevant records and images related to the planned course of therapy were reviewed.  The patient freely provided informed written consent to proceed with treatment after reviewing the details related to the planned course of therapy. The consent form was witnessed and verified by the simulation staff.  Oral cavity/upper throat were clear, neck without masses on exam today.  Then, the patient was set-up in a stable reproducible supine position for radiation therapy.  Aquaplast head and should mask was custom fabricated for immobilization.  CT images were obtained without contrast.  Surface markings were placed.  The CT images were loaded into the planning software.    TREATMENT PLANNING NOTE: Treatment planning then occurred.  The radiation prescription was entered and confirmed.    A total of 3 medically necessary complex treatment devices were fabricated and supervised by me (2 wedges for the opposed fields and the Aquaplast head and shoulder mask). I have requested : 3D Simulation  I have requested a DVH of the following structures: target volume, esophagus, spinal cord.  I have ordered:Nutrition Consult  The patient will receive 65.25 Gy in 29 fractions to the glottic larynx.  -----------------------------------  Eppie Gibson, MD

## 2019-04-12 ENCOUNTER — Telehealth: Payer: Self-pay | Admitting: *Deleted

## 2019-04-12 NOTE — Telephone Encounter (Signed)
[place] Work  Clinical Social Work was referred by Pension scheme manager for assessment of psychosocial needs.  Clinical Social Worker contacted patient by phone  to offer support and assess for needs.  CSW unable to reach patient, left VM requesting return call.    Gwinda Maine, LCSW  Clinical Social Worker [facility]

## 2019-04-16 NOTE — Progress Notes (Signed)
Oncology Nurse Navigator Documentation  To provide support, encouragement and care continuity, met with Mr. Gratz priot to and after CT SIM.    He tolerated procedure without difficulty, denied questions/concerns.   I toured him to [facility] 4 treatment area, explained procedures for lobby registration, arrival to Radiation Waiting, arrival to tmt area and preparation for tmt.  I encouraged him to call me with questions/concerns prior to 7/13 [place].  He voiced understanding.  Gayleen Orem, RN, BSN Head & Neck Oncology Nurse [place] at [place] [phone]

## 2019-04-23 ENCOUNTER — Ambulatory Visit
Admission: RE | Admit: 2019-04-23 | Discharge: 2019-04-23 | Disposition: A | Discharge status: Home / Self Care | Payer: Self-pay | Source: Ambulatory Visit | Attending: Radiation Oncology | Admitting: Radiation Oncology

## 2019-04-23 ENCOUNTER — Other Ambulatory Visit: Payer: Self-pay

## 2019-04-23 ENCOUNTER — Telehealth: Payer: Self-pay | Admitting: Radiation Oncology

## 2019-04-23 ENCOUNTER — Encounter: Payer: Self-pay | Admitting: *Deleted

## 2019-04-23 VITALS — BP 120/87 | HR 70 | Temp 98.7°F | Resp 20

## 2019-04-23 DIAGNOSIS — C320 Malignant neoplasm of glottis: Secondary | ICD-10-CM

## 2019-04-23 MED ORDER — SONAFINE EX EMUL
1.0000 "application " | Freq: Once | CUTANEOUS | Status: AC
  Administered 2019-04-23: 1 via TOPICAL

## 2019-04-23 NOTE — Progress Notes (Signed)

## 2019-04-24 ENCOUNTER — Other Ambulatory Visit: Payer: Self-pay

## 2019-04-24 ENCOUNTER — Inpatient Hospital Stay: Payer: Self-pay | Admitting: Nutrition

## 2019-04-24 ENCOUNTER — Ambulatory Visit
Admission: RE | Admit: 2019-04-24 | Discharge: 2019-04-24 | Disposition: A | Discharge status: Home / Self Care | Payer: Self-pay | Source: Ambulatory Visit | Attending: Radiation Oncology | Admitting: Radiation Oncology

## 2019-04-24 ENCOUNTER — Encounter: Payer: Self-pay | Admitting: *Deleted

## 2019-04-24 ENCOUNTER — Ambulatory Visit: Payer: Self-pay | Attending: Radiation Oncology

## 2019-04-24 ENCOUNTER — Inpatient Hospital Stay: Payer: Self-pay | Attending: Radiation Oncology | Admitting: Nutrition

## 2019-04-24 DIAGNOSIS — R1310 Dysphagia, unspecified: Secondary | ICD-10-CM | POA: Insufficient documentation

## 2019-04-24 NOTE — Therapy (Signed)
[facility] [address] [place], [place], [address] Phone: [phone]   Fax:  [phone]  Speech Language Pathology Evaluation  Patient Details  Name: Juan Lewis MRN: [id number] Date of Birth: 02-25-1971 Referring Provider (SLP): Eppie Gibson, MD   Encounter Date: 04/24/2019  End of Session - 04/24/19 1250    Visit Number  1    Number of Visits  7    Date for SLP Re-Evaluation  10/26/19    Authorization Type  self - pay    SLP Start Time  1105    SLP Stop Time   1145    SLP Time Calculation (min)  40 min    Activity Tolerance  Patient tolerated treatment well       Past Medical History:  Diagnosis Date  . Hoarseness   . Lesion of vocal cord     Past Surgical History:  Procedure Laterality Date  . MICROLARYNGOSCOPY N/A 03/23/2019   Procedure: MICROLARYNGOSCOPY WITH EXCISION OF VOCAL CORD LESION;  Surgeon: Rozetta Nunnery, MD;  Location: [facility] [place];  Service: ENT;  Laterality: N/A;    There were no vitals filed for this visit.  Subjective Assessment - 04/24/19 1243    Subjective  Pt denies modifying his diet in the last 6 months.    Patient is accompained by:  Family member   son Denyse Amass   Currently in Pain?  No/denies         SLP Evaluation [facility] - 04/24/19 1243      SLP Visit Information   SLP Received On  04/24/19    Referring Provider (SLP)  Eppie Gibson, MD    Onset Date  early 2020    Medical Diagnosis  glottic CA      Subjective   Patient/Family Stated Goal  maintain WNL swallowing      General Information   HPI  Pt presented to Dr. Lucia Gaskins in early 2020 with hoarseness x4 months. 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 Pt began rad yesterday 04-23-19.       Balance Screen   Has the patient fallen in the past 6 months  No      Prior Functional Status   Cognitive/Linguistic Baseline  Within functional limits      Oral Motor/Sensory Function   Overall Oral Motor/Sensory  Function  Other (comment)   not tested due to telephone visit      Interpretation of this visit was completed by pt's son Denyse Amass at the request of pt.   Pt currently tolerates regular diet; Does not report change of diet at all in the last 6 months.  Because data states the risk for dysphagia during and after radiation treatment is high due to undergoing radiation tx, SLP taught pt about the possibility of reduced/limited ability for PO intake during rad tx.   SLP educated pt re: changes to swallowing musculature after rad tx, and why adherence to dysphagia HEP provided today and PO consumption was necessary to inhibit muscular disuse atrophy and to reduce muscle fibrosis following rad tx. Pt demonstrated understanding of these things to SLP.    SLP then developed a HEP for pt and pt was instructed how to perform exercises involving lingual, vocal, and pharyngeal strengthening. SLP performed each exercise and pt return demonstrated each exercise. SLP ensured pt performance was correct prior to moving on to next exercise. Pt was instructed to complete this program 2 times a day, until 6 months after his last rad  tx, then x2 a week after that.                SLP Education - 04/24/19 1250    Education Details  HEP procedure, late effects head/neck radiation on swallow function    Person(s) Educated  Patient    Methods  Explanation    Comprehension  Verbalized understanding       SLP Short Term Goals - 04/24/19 1254      SLP SHORT TERM GOAL #1   Title  pt will tell SLP why he is completing HEP with rare min A over two sessions    Time  2    Period  --   sessions, for all STGs   Status  New      SLP SHORT TERM GOAL #2   Title  pt will demo understanding of HEP procedure over two sessions    Time  2    Status  New      SLP SHORT TERM GOAL #3   Title  pt will tell SLP of 3 overt s/sx aspiration PNA with modified independence    Time  2    Status  New       SLP Long  Term Goals - 04/24/19 1257      SLP LONG TERM GOAL #1   Title  pt will exhibit understanding of correct HEP procedure wiht rare min A over 4 visits    Time  4    Period  --   sessions, for all LTGs   Status  New      SLP LONG TERM GOAL #2   Title  pt will demo understanding of when he can decr frequency of HEP to 2-3 times/week, over two sessions    Time  5    Period  --   (sesssion #6)   Status  New       Plan - 04/24/19 1251    Clinical Impression Statement  This visit was completed via telephone at the direction of medical director Dr. Eppie Gibson due to pt safety precautions necessary from the COVID-19 pandemic. Pt has described oropharyngeal swallowing (through his son Denyse Amass) as WNL. He denies overt s/s aspiration PNA today. Pt has HEP for completion during and after treatment. See above for further details. The probability of swallowing difficulty increases dramatically with the initiation of chemo and radiation therapy. Pt will need to be followed by SLP for regular assessment of accurate HEP completion as well as for safety with POs both during and following treatment/s. Visits will be conducted via telephone or telehealth, remotely, until otherwise directed by medical director Dr. Eppie Gibson.    Speech Therapy Frequency  --   approx once every 4 weeks   Duration  --   7 total sessions   Treatment/Interventions  Aspiration precaution training;Pharyngeal strengthening exercises;Diet toleration management by SLP;Internal/external aids;Patient/family education;Compensatory strategies;SLP instruction and feedback;Cueing hierarchy;Trials of upgraded texture/liquids    Potential to Achieve Goals  Good    SLP Home Exercise Plan  provided today    Consulted and Agree with Plan of Care  Patient       Patient will benefit from skilled therapeutic intervention in order to improve the following deficits and impairments:   1. Dysphagia, unspecified type       Problem List Patient  Active Problem List   Diagnosis Date Noted  . Malignant neoplasm of glottis ([place]) 03/28/2019    [place] ,[place], [place]  04/24/2019, 12:59 PM  [facility] [address] [place], [place], [address] Phone: [phone]   Fax:  [phone]  Name: Juan Lewis MRN: [id number] Date of Birth: Jan 06, 1971

## 2019-04-24 NOTE — Patient Instructions (Signed)
SWALLOWING EXERCISES Do these until October 26, 2019, then 2 times per week afterwards  1. Effortful Swallows - Press your tongue against the roof of your mouth for 3 seconds, then squeeze the muscles in your neck while you swallow your saliva or a sip of water - Repeat 10-15 times, 2 times a day, and use whenever you eat or drink  2. Pitch Raise - Repeat "he", once per second in as high of a pitch as you can - Repeat 20 times, 2 times a day        3.   Siren exercise  - start "ah" at as low of a pitch as you can and go up to as high of a pitch as you can  - go back down as low as you can  - repeat 20 times, twice a day       4.  Super swallow  - hold your breath  - bear down   - swallow and cough immediately  - repeat 10 times, twice a day

## 2019-04-24 NOTE — Progress Notes (Signed)
48 year old male diagnosed with [place] cancer to receive 6 weeks of radiation therapy.  Past medical history includes tobacco usage.  Medications include Prilosec,  Labs were reviewed.  Height: 5 feet 7 inches. Weight: 153.8 pounds on June 12. BMI: 23.59.  Nutrition diagnosis:  Food and nutrition related knowledge deficit related to larynx cancer and associated treatments as evidenced by no prior need for nutrition related information.  Intervention: Patient educated to consume small frequent meals and snacks with adequate calories and protein. Stressed importance of weight maintenance. Questions were answered.  Teach back method used.    Monitoring, evaluation, goals: Patient will tolerate adequate calories and protein to minimize weight loss.  Next visit: Thursday, July 30.

## 2019-04-24 NOTE — Progress Notes (Signed)
Oncology Nurse Navigator Documentation  To provide support, encouragement and care continuity, met with Mr. Apfel prior to and after his initial  RT.    He completed treatment without difficulty, denied questions/concerns.  I reviewed the registration/arrival procedure for subsequent treatments.  I reviewed call times for SLP and Nutrition for tomorrow morning's Tele-H&N M[facility], provided him handouts for his reference during calls.  I encouraged him to have his son available to help with explanations.  He voiced understanding.  Gayleen Orem, RN, BSN Head & Neck Oncology Nurse [place] at [place] [phone]

## 2019-04-25 ENCOUNTER — Other Ambulatory Visit: Payer: Self-pay

## 2019-04-25 ENCOUNTER — Ambulatory Visit
Admission: RE | Admit: 2019-04-25 | Discharge: 2019-04-25 | Disposition: A | Discharge status: Home / Self Care | Payer: Self-pay | Source: Ambulatory Visit | Attending: Radiation Oncology | Admitting: Radiation Oncology

## 2019-04-26 ENCOUNTER — Ambulatory Visit
Admission: RE | Admit: 2019-04-26 | Discharge: 2019-04-26 | Disposition: A | Discharge status: Home / Self Care | Payer: Self-pay | Source: Ambulatory Visit | Attending: Radiation Oncology | Admitting: Radiation Oncology

## 2019-04-26 ENCOUNTER — Other Ambulatory Visit: Payer: Self-pay

## 2019-04-26 NOTE — Progress Notes (Signed)
Oncology Nurse Navigator Documentation  In support of COVID-19 mitigation practices, Mr. Juan Lewis participated in this morning's H&N [place] via telephone and received scheduled call(s) from Nutrition and SLP.  He will be referred to PT  post-RT for lymphedema evaluation/treatment as needed.  Gayleen Orem, RN, BSN Head & Neck Oncology Nurse [place] at [place] [phone]

## 2019-04-27 ENCOUNTER — Ambulatory Visit
Admission: RE | Admit: 2019-04-27 | Discharge: 2019-04-27 | Disposition: A | Discharge status: Home / Self Care | Payer: Self-pay | Source: Ambulatory Visit | Attending: Radiation Oncology | Admitting: Radiation Oncology

## 2019-04-27 ENCOUNTER — Other Ambulatory Visit: Payer: Self-pay

## 2019-04-30 ENCOUNTER — Other Ambulatory Visit: Payer: Self-pay | Admitting: Radiation Oncology

## 2019-04-30 ENCOUNTER — Other Ambulatory Visit: Payer: Self-pay

## 2019-04-30 ENCOUNTER — Ambulatory Visit
Admission: RE | Admit: 2019-04-30 | Discharge: 2019-04-30 | Disposition: A | Discharge status: Home / Self Care | Payer: Self-pay | Source: Ambulatory Visit | Attending: Radiation Oncology | Admitting: Radiation Oncology

## 2019-04-30 DIAGNOSIS — C320 Malignant neoplasm of glottis: Secondary | ICD-10-CM

## 2019-04-30 MED ORDER — LIDOCAINE VISCOUS HCL 2 % MT SOLN: OROMUCOSAL | 5 refills | Status: DC

## 2019-05-01 ENCOUNTER — Other Ambulatory Visit: Payer: Self-pay

## 2019-05-01 ENCOUNTER — Ambulatory Visit
Admission: RE | Admit: 2019-05-01 | Discharge: 2019-05-01 | Disposition: A | Discharge status: Home / Self Care | Payer: Self-pay | Source: Ambulatory Visit | Attending: Radiation Oncology | Admitting: Radiation Oncology

## 2019-05-02 ENCOUNTER — Ambulatory Visit
Admission: RE | Admit: 2019-05-02 | Discharge: 2019-05-02 | Disposition: A | Discharge status: Home / Self Care | Payer: Self-pay | Source: Ambulatory Visit | Attending: Radiation Oncology | Admitting: Radiation Oncology

## 2019-05-02 ENCOUNTER — Other Ambulatory Visit: Payer: Self-pay

## 2019-05-03 ENCOUNTER — Other Ambulatory Visit: Payer: Self-pay

## 2019-05-03 ENCOUNTER — Ambulatory Visit
Admission: RE | Admit: 2019-05-03 | Discharge: 2019-05-03 | Disposition: A | Discharge status: Home / Self Care | Payer: Self-pay | Source: Ambulatory Visit | Attending: Radiation Oncology | Admitting: Radiation Oncology

## 2019-05-04 ENCOUNTER — Ambulatory Visit
Admission: RE | Admit: 2019-05-04 | Discharge: 2019-05-04 | Disposition: A | Discharge status: Home / Self Care | Payer: Self-pay | Source: Ambulatory Visit | Attending: Radiation Oncology | Admitting: Radiation Oncology

## 2019-05-04 ENCOUNTER — Other Ambulatory Visit: Payer: Self-pay

## 2019-05-07 ENCOUNTER — Ambulatory Visit
Admission: RE | Admit: 2019-05-07 | Discharge: 2019-05-07 | Disposition: A | Discharge status: Home / Self Care | Payer: Self-pay | Source: Ambulatory Visit | Attending: Radiation Oncology | Admitting: Radiation Oncology

## 2019-05-07 ENCOUNTER — Other Ambulatory Visit: Payer: Self-pay

## 2019-05-08 ENCOUNTER — Ambulatory Visit
Admission: RE | Admit: 2019-05-08 | Discharge: 2019-05-08 | Disposition: A | Discharge status: Home / Self Care | Payer: Self-pay | Source: Ambulatory Visit | Attending: Radiation Oncology | Admitting: Radiation Oncology

## 2019-05-08 ENCOUNTER — Other Ambulatory Visit: Payer: Self-pay

## 2019-05-09 ENCOUNTER — Telehealth: Payer: Self-pay | Admitting: Radiation Oncology

## 2019-05-09 ENCOUNTER — Encounter: Payer: Self-pay | Admitting: *Deleted

## 2019-05-09 ENCOUNTER — Ambulatory Visit
Admission: RE | Admit: 2019-05-09 | Discharge: 2019-05-09 | Disposition: A | Discharge status: Home / Self Care | Payer: Self-pay | Source: Ambulatory Visit | Attending: Radiation Oncology | Admitting: Radiation Oncology

## 2019-05-09 ENCOUNTER — Other Ambulatory Visit: Payer: Self-pay | Admitting: Radiation Oncology

## 2019-05-09 ENCOUNTER — Other Ambulatory Visit: Payer: Self-pay

## 2019-05-09 DIAGNOSIS — C320 Malignant neoplasm of glottis: Secondary | ICD-10-CM

## 2019-05-09 MED ORDER — HYDROCODONE-ACETAMINOPHEN 7.5-325 MG/15ML PO SOLN: 10.0000 mL | ORAL | 0 refills | Status: DC | PRN

## 2019-05-09 MED ORDER — DOCUSATE SODIUM 100 MG PO CAPS: 200.0000 mg | ORAL_CAPSULE | Freq: Every day | ORAL | 1 refills | Status: DC

## 2019-05-09 NOTE — Progress Notes (Signed)
Oncology Nurse Navigator Documentation  Per patient request, prepared letter signed by Dr. Isidore Moos providing chronology of diagnostic appts/procedures, consults, radiotherapy plan.  Letter taken to [facility] 4 for his receipt tomorrow.  Gayleen Orem, RN, BSN Head & Neck Oncology Nurse [place] at [place] [phone]

## 2019-05-10 ENCOUNTER — Inpatient Hospital Stay: Payer: Self-pay | Admitting: Nutrition

## 2019-05-10 ENCOUNTER — Ambulatory Visit
Admission: RE | Admit: 2019-05-10 | Discharge: 2019-05-10 | Disposition: A | Discharge status: Home / Self Care | Payer: Self-pay | Source: Ambulatory Visit | Attending: Radiation Oncology | Admitting: Radiation Oncology

## 2019-05-10 ENCOUNTER — Other Ambulatory Visit: Payer: Self-pay

## 2019-05-10 NOTE — Progress Notes (Signed)
Nutrition follow-up completed with patient receiving radiation therapy for laryngeal cancer. Patient reports he lost 5 pounds. There is no recent weight documented in epic. Patient verbalizes he has a poor appetite which prohibits him from eating more. He denies other nutrition impact symptoms.  Nutrition diagnosis: Food and nutrition related knowledge deficit continues.  Intervention: Patient was educated to try to consume smaller amounts of food more often increasing calories and protein. Encouraged weight maintenance. Recommended patient try to add oral nutrition supplements twice daily. Provided multiple different samples along with coupons and contact information. Also provided patient with additional facts sheets on poor appetite and increasing calories and protein.  Monitoring, evaluation, goals: Patient will tolerate increased calories and protein to minimize further weight loss.  Next visit: Thursday, August 6 by telephone.  **Disclaimer: This note was dictated with voice recognition software. Similar sounding words can inadvertently be transcribed and this note may contain transcription errors which may not have been corrected upon publication of note.**

## 2019-05-11 ENCOUNTER — Ambulatory Visit
Admission: RE | Admit: 2019-05-11 | Discharge: 2019-05-11 | Disposition: A | Discharge status: Home / Self Care | Payer: Self-pay | Source: Ambulatory Visit | Attending: Radiation Oncology | Admitting: Radiation Oncology

## 2019-05-11 ENCOUNTER — Other Ambulatory Visit: Payer: Self-pay

## 2019-05-14 ENCOUNTER — Ambulatory Visit
Admission: RE | Admit: 2019-05-14 | Discharge: 2019-05-14 | Disposition: A | Discharge status: Home / Self Care | Payer: Self-pay | Source: Ambulatory Visit | Attending: Radiation Oncology | Admitting: Radiation Oncology

## 2019-05-14 ENCOUNTER — Other Ambulatory Visit: Payer: Self-pay

## 2019-05-14 DIAGNOSIS — C320 Malignant neoplasm of glottis: Secondary | ICD-10-CM | POA: Insufficient documentation

## 2019-05-14 DIAGNOSIS — Z510 Encounter for antineoplastic radiation therapy: Secondary | ICD-10-CM | POA: Insufficient documentation

## 2019-05-15 ENCOUNTER — Other Ambulatory Visit: Payer: Self-pay

## 2019-05-15 ENCOUNTER — Ambulatory Visit
Admission: RE | Admit: 2019-05-15 | Discharge: 2019-05-15 | Disposition: A | Discharge status: Home / Self Care | Payer: Self-pay | Source: Ambulatory Visit | Attending: Radiation Oncology | Admitting: Radiation Oncology

## 2019-05-16 ENCOUNTER — Ambulatory Visit
Admission: RE | Admit: 2019-05-16 | Discharge: 2019-05-16 | Disposition: A | Discharge status: Home / Self Care | Payer: Self-pay | Source: Ambulatory Visit | Attending: Radiation Oncology | Admitting: Radiation Oncology

## 2019-05-16 ENCOUNTER — Other Ambulatory Visit: Payer: Self-pay

## 2019-05-17 ENCOUNTER — Ambulatory Visit
Admission: RE | Admit: 2019-05-17 | Discharge: 2019-05-17 | Disposition: A | Discharge status: Home / Self Care | Payer: Self-pay | Source: Ambulatory Visit | Attending: Radiation Oncology | Admitting: Radiation Oncology

## 2019-05-17 ENCOUNTER — Other Ambulatory Visit: Payer: Self-pay

## 2019-05-17 ENCOUNTER — Inpatient Hospital Stay: Payer: Self-pay | Attending: Radiation Oncology | Admitting: Nutrition

## 2019-05-17 NOTE — Progress Notes (Signed)
I contacted patient by telephone.  He was not available.  I left my name and phone number and asked patient to return my call.

## 2019-05-18 ENCOUNTER — Ambulatory Visit
Admission: RE | Admit: 2019-05-18 | Discharge: 2019-05-18 | Disposition: A | Discharge status: Home / Self Care | Payer: Self-pay | Source: Ambulatory Visit | Attending: Radiation Oncology | Admitting: Radiation Oncology

## 2019-05-18 ENCOUNTER — Other Ambulatory Visit: Payer: Self-pay

## 2019-05-21 ENCOUNTER — Ambulatory Visit
Admission: RE | Admit: 2019-05-21 | Discharge: 2019-05-21 | Disposition: A | Discharge status: Home / Self Care | Payer: Self-pay | Source: Ambulatory Visit | Attending: Radiation Oncology | Admitting: Radiation Oncology

## 2019-05-21 ENCOUNTER — Other Ambulatory Visit: Payer: Self-pay

## 2019-05-21 ENCOUNTER — Other Ambulatory Visit: Payer: Self-pay | Admitting: Radiation Oncology

## 2019-05-21 DIAGNOSIS — C320 Malignant neoplasm of glottis: Secondary | ICD-10-CM

## 2019-05-21 MED ORDER — HYDROCODONE-ACETAMINOPHEN 5-325 MG PO TABS: 1.0000 | ORAL_TABLET | ORAL | 0 refills | Status: DC | PRN

## 2019-05-21 MED ORDER — LIDOCAINE VISCOUS HCL 2 % MT SOLN: OROMUCOSAL | 5 refills | Status: DC

## 2019-05-22 ENCOUNTER — Ambulatory Visit
Admission: RE | Admit: 2019-05-22 | Discharge: 2019-05-22 | Disposition: A | Discharge status: Home / Self Care | Payer: Self-pay | Source: Ambulatory Visit | Attending: Radiation Oncology | Admitting: Radiation Oncology

## 2019-05-22 ENCOUNTER — Other Ambulatory Visit: Payer: Self-pay

## 2019-05-23 ENCOUNTER — Other Ambulatory Visit: Payer: Self-pay

## 2019-05-23 ENCOUNTER — Ambulatory Visit
Admission: RE | Admit: 2019-05-23 | Discharge: 2019-05-23 | Disposition: A | Discharge status: Home / Self Care | Payer: Self-pay | Source: Ambulatory Visit | Attending: Radiation Oncology | Admitting: Radiation Oncology

## 2019-05-24 ENCOUNTER — Inpatient Hospital Stay: Payer: Self-pay | Admitting: Nutrition

## 2019-05-24 ENCOUNTER — Ambulatory Visit
Admission: RE | Admit: 2019-05-24 | Discharge: 2019-05-24 | Disposition: A | Discharge status: Home / Self Care | Payer: Self-pay | Source: Ambulatory Visit | Attending: Radiation Oncology | Admitting: Radiation Oncology

## 2019-05-24 ENCOUNTER — Other Ambulatory Visit: Payer: Self-pay

## 2019-05-24 NOTE — Progress Notes (Signed)
Patient contacted by telephone for nutrition follow up. Patient receiving radiation therapy for laryngeal cancer. Patient reports he weighed 152 pounds this week. (indicates a 1 pound weight loss over one week.) He denies nutrition impact symptoms. He enjoyed the oral nutrition supplements provided to him last week.  Nutrition Diagnosis: Food and Nutrition related knowledge deficit continues.  Intervention: Educated patient to continue strategies for adequate calorie and protein intake. We will provide 1 complementary case of Ensure Enlive for patient to pick up in radiation tomorrow.  Encourage patient to drink 2 daily. Questions answered.  Teach back method used.  Monitoring, evaluation, goals: Patient will tolerate adequate calories and protein to minimize weight loss.  Next visit: Thursday, August 20 by telephone.  **Disclaimer: This note was dictated with voice recognition software. Similar sounding words can inadvertently be transcribed and this note may contain transcription errors which may not have been corrected upon publication of note.**

## 2019-05-25 ENCOUNTER — Other Ambulatory Visit: Payer: Self-pay

## 2019-05-25 ENCOUNTER — Ambulatory Visit
Admission: RE | Admit: 2019-05-25 | Discharge: 2019-05-25 | Disposition: A | Discharge status: Home / Self Care | Payer: Self-pay | Source: Ambulatory Visit | Attending: Radiation Oncology | Admitting: Radiation Oncology

## 2019-05-28 ENCOUNTER — Other Ambulatory Visit: Payer: Self-pay | Admitting: Radiation Oncology

## 2019-05-28 ENCOUNTER — Other Ambulatory Visit: Payer: Self-pay

## 2019-05-28 ENCOUNTER — Ambulatory Visit: Payer: Self-pay | Attending: Radiation Oncology

## 2019-05-28 ENCOUNTER — Ambulatory Visit
Admission: RE | Admit: 2019-05-28 | Discharge: 2019-05-28 | Disposition: A | Discharge status: Home / Self Care | Payer: Self-pay | Source: Ambulatory Visit | Attending: Radiation Oncology | Admitting: Radiation Oncology

## 2019-05-28 DIAGNOSIS — C320 Malignant neoplasm of glottis: Secondary | ICD-10-CM

## 2019-05-28 DIAGNOSIS — R1310 Dysphagia, unspecified: Secondary | ICD-10-CM | POA: Insufficient documentation

## 2019-05-28 MED ORDER — SONAFINE EX EMUL
1.0000 "application " | Freq: Once | CUTANEOUS | Status: AC
  Administered 2019-05-28: 1 via TOPICAL

## 2019-05-28 MED ORDER — HYDROCODONE-ACETAMINOPHEN 5-325 MG PO TABS: 1.0000 | ORAL_TABLET | ORAL | 0 refills | Status: DC | PRN

## 2019-05-28 NOTE — Therapy (Signed)
[facility] [address] [place], [place], [address] Phone: [phone]   Fax:  [phone]  Speech Language Pathology Treatment  Patient Details  Name: Juan Lewis MRN: [id number] Date of Birth: 12/08/1970 Referring Provider (SLP): Eppie Gibson, MD   Encounter Date: 05/28/2019  End of Session - 05/28/19 1342    Visit Number  2    Number of Visits  7    Date for SLP Re-Evaluation  10/26/19    Authorization Type  self - pay    SLP Start Time  1104    SLP Stop Time   1140    SLP Time Calculation (min)  36 min    Activity Tolerance  Patient tolerated treatment well       Past Medical History:  Diagnosis Date  . Hoarseness   . Lesion of vocal cord     Past Surgical History:  Procedure Laterality Date  . MICROLARYNGOSCOPY N/A 03/23/2019   Procedure: MICROLARYNGOSCOPY WITH EXCISION OF VOCAL CORD LESION;  Surgeon: Rozetta Nunnery, MD;  Location: [facility];  Service: ENT;  Laterality: N/A;    There were no vitals filed for this visit.  Subjective Assessment - 05/28/19 1337    Subjective  Pt eating "soft foods".    Patient is accompained by:  Family member   son, interpreting PRN   Currently in Pain?  Yes    Pain Score  3     Pain Location  Throat    Pain Orientation  Mid    Pain Descriptors / Indicators  Sore    Pain Type  Acute pain    Pain Onset  1 to 4 weeks ago    Aggravating Factors   swallowing            ADULT SLP TREATMENT - 05/28/19 1338      General Information   Behavior/Cognition  Alert;Cooperative;Pleasant mood      Treatment Provided   Treatment provided  Dysphagia      Dysphagia Treatment   Temperature Spikes Noted  No    Respiratory Status  Room air    Treatment Methods  Skilled observation;Therapeutic exercise;Patient/caregiver education    Patient observed directly with PO's  Yes    Type of PO's observed  Dysphagia 3 (soft);Thin liquids    Liquids  provided via  --   bottle   Oral Phase Signs & Symptoms  --   none   Pharyngeal Phase Signs & Symptoms  --   none   Other treatment/comments  Pt without overt s/s aspiration or oral stage difficulties today. SLP req'd to provide mod cues usually (3/4 exercises) for proper completion of HEP. By session's end, pt performed all exercises correctly. Pt ate banana and drank H2O without any overt s/s oral or pharyngeal stage problems; pt did not report pharyngeal clearance problems when asked by SLP. He told SLP rationale for HEP with independence.       Assessment / Recommendations / Plan   Plan  Continue with current plan of care      Progression Toward Goals   Progression toward goals  Progressing toward goals       SLP Education - 05/28/19 1342    Education Details  HEP procedure    Person(s) Educated  Patient    Methods  Explanation;Demonstration    Comprehension  Verbalized understanding;Returned demonstration;Verbal cues required       SLP Short Term Goals - 05/28/19 1126  SLP SHORT TERM GOAL #1   Title  pt will tell SLP why he is completing HEP with rare min A over two sessions    Baseline  05-28-19    Time  1    Period  --   sessions, for all STGs   Status  On-going      SLP SHORT TERM GOAL #2   Title  pt will demo understanding of HEP procedure over two sessions    Time  1    Status  On-going      SLP SHORT TERM GOAL #3   Title  pt will tell SLP of 3 overt s/sx aspiration PNA with modified independence    Time  1    Status  On-going       SLP Long Term Goals - 05/28/19 1344      SLP LONG TERM GOAL #1   Title  pt will exhibit understanding of correct HEP procedure wiht rare min A over 4 visits    Time  3    Period  --   sessions, for all LTGs   Status  On-going      SLP LONG TERM GOAL #2   Title  pt will demo understanding of when he can decr frequency of HEP to 2-3 times/week, over two sessions    Time  4    Period  --   (sesssion #6)   Status   On-going       Plan - 05/28/19 1342    Clinical Impression Statement  This visit was completed via Webex at the direction of medical director Dr. Eppie Gibson due to pt safety precautions necessary from the COVID-19 pandemic. Pt has demonstrated oropharyngeal swallowing today with banana and water as WNL. He denies overt s/s aspiration PNA today. Pt has HEP for completion during and after treatment; pt req'd mod A usually for proper completion; he told SLP rationale for HEP. The probability of swallowing difficulty increases dramatically with the initiation of chemo and radiation therapy. Pt will need to be followed by SLP for regular assessment of accurate HEP completion as well as for safety with POs both during and following treatment/s. Visits will be conducted via telephone or telehealth, remotely, until otherwise directed by medical director Dr. Eppie Gibson.    Speech Therapy Frequency  --   approx once every 4 weeks   Duration  --   7 total sessions   Treatment/Interventions  Aspiration precaution training;Pharyngeal strengthening exercises;Diet toleration management by SLP;Internal/external aids;Patient/family education;Compensatory strategies;SLP instruction and feedback;Cueing hierarchy;Trials of upgraded texture/liquids    Potential to Achieve Goals  Good    SLP Home Exercise Plan  provided today    Consulted and Agree with Plan of Care  Patient       Patient will benefit from skilled therapeutic intervention in order to improve the following deficits and impairments:   1. Dysphagia, unspecified type       Problem List Patient Active Problem List   Diagnosis Date Noted  . 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

## 2019-05-29 ENCOUNTER — Other Ambulatory Visit: Payer: Self-pay

## 2019-05-29 ENCOUNTER — Ambulatory Visit
Admission: RE | Admit: 2019-05-29 | Discharge: 2019-05-29 | Disposition: A | Discharge status: Home / Self Care | Payer: Self-pay | Source: Ambulatory Visit | Attending: Radiation Oncology | Admitting: Radiation Oncology

## 2019-05-30 ENCOUNTER — Ambulatory Visit
Admission: RE | Admit: 2019-05-30 | Discharge: 2019-05-30 | Disposition: A | Discharge status: Home / Self Care | Payer: Self-pay | Source: Ambulatory Visit | Attending: Radiation Oncology | Admitting: Radiation Oncology

## 2019-05-30 ENCOUNTER — Telehealth: Payer: Self-pay | Admitting: Radiation Oncology

## 2019-05-30 ENCOUNTER — Other Ambulatory Visit: Payer: Self-pay

## 2019-05-31 ENCOUNTER — Other Ambulatory Visit: Payer: Self-pay

## 2019-05-31 ENCOUNTER — Inpatient Hospital Stay: Payer: Self-pay | Admitting: Nutrition

## 2019-05-31 ENCOUNTER — Ambulatory Visit
Admission: RE | Admit: 2019-05-31 | Discharge: 2019-05-31 | Disposition: A | Discharge status: Home / Self Care | Payer: Self-pay | Source: Ambulatory Visit | Attending: Radiation Oncology | Admitting: Radiation Oncology

## 2019-05-31 ENCOUNTER — Encounter: Payer: Self-pay | Admitting: Radiation Oncology

## 2019-05-31 NOTE — Progress Notes (Signed)
Nutrition follow-up completed with patient over the phone.   Today was patient's last radiation therapy for laryngeal cancer. Patient reports he weighed 153 pounds this week indicating weight stability. He denies nutrition impact symptoms. He reports he has been been drinking Ensure Nelson but has run out.  Nutrition diagnosis: Food and nutrition related knowledge deficit resolved.  I offered 1 complementary case of Ensure Enlive.  Patient agrees to pick up tomorrow morning around 10 AM from [facility] services. Encourage patient to contact me with any further questions or concerns.  **Disclaimer: This note was dictated with voice recognition software. Similar sounding words can inadvertently be transcribed and this note may contain transcription errors which may not have been corrected upon publication of note.**

## 2019-06-01 ENCOUNTER — Encounter: Payer: Self-pay | Admitting: *Deleted

## 2019-06-01 NOTE — Progress Notes (Signed)
Oncology Nurse Navigator Documentation  Met with Juan Lewis during final RT to offer support and to celebrate end of radiation treatment.   Provided verbal/written post-RT guidance:  Importance of keeping all follow-up appts, especially those with Nutrition and SLP.  Provided appt date/time for Follow-up with Dr. Isidore Moos.  Importance of protecting treatment area from sun.  Continuation of Sonafine application 2-3 times daily until supply exhausted after which transition to OTC lotion with vitamin E. Explained my role as navigator will continue for several more months and that I will be calling and/or joining him during follow-up visits.   I encouraged him to call me with needs/concerns.   He verbalized understanding of information provided.  Gayleen Orem, RN, BSN Head & Neck Oncology [place] at [place] [phone]

## 2019-06-12 NOTE — Progress Notes (Signed)
Juan Lewis presents for follow up of radiation completed 06/01/19 to his head and neck.   Pain issues, if any: he reports occasional pain with swallowing. He says it is slightly better today. He says it was worse this past Tuesday.  Using a feeding tube?: No Weight changes, if any:  Wt Readings from Last 3 Encounters:  06/15/19 152 lb 9.6 oz (69.2 kg)  03/23/19 153 lb 14.1 oz (69.8 kg)   Swallowing issues, if any: He is only eating softer foods. He does report a chronic loss of appetite. He does drink water while eating due to a dry mouth.  Smoking or chewing tobacco? He quit smoking at the start of radiation.  Using fluoride trays daily?  Last ENT visit was on: N/A Other notable issues, if any:  His skin has healed well.  07/02/19 ST appointment with Alba Destine.   BP 103/76   Pulse 78   Temp 99.4 F (37.4 C) (Tympanic)   Wt 152 lb 9.6 oz (69.2 kg)   SpO2 98% Comment: room air  BMI 23.40 kg/m

## 2019-06-15 ENCOUNTER — Other Ambulatory Visit: Payer: Self-pay

## 2019-06-15 ENCOUNTER — Ambulatory Visit
Admission: RE | Admit: 2019-06-15 | Discharge: 2019-06-15 | Disposition: A | Discharge status: Home / Self Care | Payer: Self-pay | Source: Ambulatory Visit | Attending: Radiation Oncology | Admitting: Radiation Oncology

## 2019-06-15 ENCOUNTER — Encounter: Payer: Self-pay | Admitting: Radiation Oncology

## 2019-06-15 DIAGNOSIS — Z923 Personal history of irradiation: Secondary | ICD-10-CM | POA: Insufficient documentation

## 2019-06-15 DIAGNOSIS — R630 Anorexia: Secondary | ICD-10-CM | POA: Insufficient documentation

## 2019-06-15 DIAGNOSIS — C320 Malignant neoplasm of glottis: Secondary | ICD-10-CM | POA: Insufficient documentation

## 2019-06-15 DIAGNOSIS — Z87891 Personal history of nicotine dependence: Secondary | ICD-10-CM | POA: Insufficient documentation

## 2019-06-15 DIAGNOSIS — Z79899 Other long term (current) drug therapy: Secondary | ICD-10-CM | POA: Insufficient documentation

## 2019-06-15 DIAGNOSIS — R682 Dry mouth, unspecified: Secondary | ICD-10-CM | POA: Insufficient documentation

## 2019-06-15 NOTE — Progress Notes (Signed)
  Patient Name: Juan Lewis MRN: [id number] DOB: 03/14/1971 Referring Physician: Lucia Gaskins DAVID (Profile Not Attached) Date of Service: 05/31/2019 [facility], [place]                                                        End Of Treatment Note  Diagnoses: C32.0-Malignant neoplasm of glottis  Cancer Staging: Stage II (cT2, cN0, cM0)  Intent: Curative  Radiation Treatment Dates: 04/23/2019 through 05/31/2019 Site Technique Total Dose (Gy) Dose per Fx Completed Fx Beam Energies  Head & neck: HN_larynx 3D 65.25 /65.25 2.25 29/29 6X   Narrative: The patient tolerated radiation therapy relatively well. He began to develop side effects about half way through treatment. He reported pain to his throat and difficulty swallowing. He also reported fatigue, thick saliva, and mouth sores. He experienced voice hoarseness and dry desquamation to his neck.  Plan: The patient will follow-up with radiation oncology in 2 weeks.  ________________________________________________   Eppie Gibson, MD  This document serves as a record of services personally performed by Eppie Gibson, MD. It was created on her behalf by Wilburn Mylar, a trained medical scribe. The creation of this record is based on the scribe's personal observations and the provider's statements to them. This document has been checked and approved by the attending provider.

## 2019-06-15 NOTE — Progress Notes (Signed)
Radiation Oncology         [phone] ________________________________  Name: Juan Lewis MRN: [id number]  Date: 06/15/2019  DOB: Oct 09, 1971  Follow-Up Visit Note, in person, outpatient   CC: Patient, No Pcp Per  Alphonsa Overall, MD  Diagnosis and Prior Radiotherapy:       ICD-10-CM   1. Malignant neoplasm of glottis ([place])  C32.0     Radiation Treatment Dates: 04/23/2019 through 05/31/2019 Site Technique Total Dose Dose per Fx Completed Fx Beam Energies  Head & neck: HN_larynx 3D 65.25/65.25 2.25 29/29 6X    CHIEF COMPLAINT:  Here for follow-up and surveillance of glottic cancer  Narrative:  The patient returns today for routine follow-up. He last saw nutrition on his final day of treatment, and he is scheduled to follow up with SLP on 07/02/2019.  Pain issues, if any: he reports occasional pain with swallowing. He says it is slightly better today. He says it was worse this past Tuesday.  Using a feeding tube?: No Weight changes, if any:  Wt Readings from Last 3 Encounters:  06/15/19 152 lb 9.6 oz (69.2 kg)  03/23/19 153 lb 14.1 oz (69.8 kg)   Swallowing issues, if any: He is only eating softer foods. He does report a chronic loss of appetite. He does drink water while eating due to a dry mouth.  Smoking or chewing tobacco? He quit smoking at the start of radiation.  Using fluoride trays daily?  Last ENT visit was on: N/A Other notable issues, if any:  His skin has healed well.   ALLERGIES:  has No Known Allergies.  Meds: Current Outpatient Medications  Medication Sig Dispense Refill  . docusate sodium (COLACE) 100 MG capsule Take 2 capsules (200 mg total) by mouth daily. This will help prevent constipation while you are taking Hycet pain medicaiton. 60 capsule 1  . lidocaine (XYLOCAINE) 2 % solution Patient: Mix 1part 2% viscous lidocaine, 1part H20. Swallow 7mL of diluted mixture, up to QID prn sore throat; may use 20 min before meals. 100 mL 5  . loratadine (CLARITIN)  10 MG tablet TK 1 T PO QD IN THE MORNING    . naproxen (NAPROSYN) 500 MG tablet TK 1 T PO BID PRN    . HYDROcodone-acetaminophen (NORCO/VICODIN) 5-325 MG tablet Take 1-2 tablets by mouth every 4 (four) hours as needed for moderate pain. (Patient not taking: Reported on 06/15/2019) 56 tablet 0  . omeprazole (PRILOSEC) 40 MG capsule Take 1 capsule (40 mg total) by mouth daily for 14 days. 14 capsule 1   No current facility-administered medications for this encounter.     Physical Findings: The patient is in no acute distress. Patient is alert and oriented. Wt Readings from Last 3 Encounters:  06/15/19 152 lb 9.6 oz (69.2 kg)  03/23/19 153 lb 14.1 oz (69.8 kg)    weight is 152 lb 9.6 oz (69.2 kg). His tympanic temperature is 99.4 F (37.4 C). His blood pressure is 103/76 and his pulse is 78. His oxygen saturation is 98%. .  General: Alert and oriented, in no acute distress HEENT: Head is normocephalic. Oropharynx is notable for no lesions Neck: Neck is notable for healing skin Skin: Skin in treatment fields shows satisfactory healing - still dry with normalizing pigment changes Psychiatric: Judgment and insight are intact. Affect is appropriate.   Lab Findings: Lab Results  Component Value Date   WBC 4.0 12/05/2017   HGB 13.5 12/05/2017   HCT 41.5 12/05/2017   MCV 88.7  12/05/2017   PLT 239 12/05/2017    No results found for: TSH  Radiographic Findings: No results found.  Impression/Plan:    1) Head and Neck Cancer Status: healing well from RT  2) Nutritional Status: stable weight PEG tube: n/a  3) Risk Factors: The patient has been educated about risk factors including alcohol and tobacco abuse; they understand that avoidance of alcohol and tobacco is important to prevent recurrences as well as other cancers - not smoking.  4) Swallowing: SLP f/u this month  5)  Thyroid function: No results found for: TSH Will check annually.  6) Other: I asked Gayleen Orem, RN, our Head  and Neck Oncology Navigator to set up f/u with Dr. Lucia Gaskins in 2 mo.  Since we are not performing laryngoscopies at the [facility] due to PPE shortage, Dr Lucia Gaskins will need to see him q 69mo for surveillance if feasible. I'll see him in a year,  7) The patient was encouraged to call with any issues or questions before then.  I spent 10 minutes face to face with the patient and more than 50% of that time was spent in counseling and/or coordination of care. _____________________________________   Eppie Gibson, MD  This document serves as a record of services personally performed by Eppie Gibson, MD. It was created on her behalf by Wilburn Mylar, a trained medical scribe. The creation of this record is based on the scribe's personal observations and the provider's statements to them. This document has been checked and approved by the attending provider.

## 2019-06-22 ENCOUNTER — Telehealth: Payer: Self-pay | Admitting: *Deleted

## 2019-06-23 NOTE — Telephone Encounter (Signed)
Oncology Nurse Navigator Documentation  Per patient's 9/4 post-treatment follow-up with Dr. Isidore Moos, called ENT Dr. Pollie Friar office to coordinate appointment.  Spoke with Gay Filler, requested mid-November f/u with Lucia Gaskins, indicated pt will need q46mo laryngoscopy as [facility] not currently conducting laryngoscopies, Dr. Isidore Moos plans to see pt in 12 months.  She verbalized understanding.  Gayleen Orem, RN, BSN Head & Neck Oncology Nurse [place] [facility] [place] [phone]

## 2019-07-02 ENCOUNTER — Other Ambulatory Visit: Payer: Self-pay

## 2019-07-02 ENCOUNTER — Ambulatory Visit: Payer: Self-pay | Attending: Radiation Oncology

## 2019-07-02 DIAGNOSIS — R1310 Dysphagia, unspecified: Secondary | ICD-10-CM | POA: Insufficient documentation

## 2019-07-02 NOTE — Therapy (Signed)
[facility] [address] [place], [place], [address] Phone: [phone]   Fax:  [phone]  Patient Details  Name: Juan Lewis MRN: [id number] Date of Birth: 02-28-1971 Referring Provider:  Eppie Gibson, MD  Encounter Date: 07/02/2019  ST - Arrive/Cancel  Pt was driving when rehab tech initiated [facility] appointment. Pt had stopped his vehicle when talking with rehab tech but had begun driving again when talking with SLP. SLP asked pt to park his car, and attempted to conduct Webex visit with pt but SLP had to repeat each utterance and rephrase at times due to language barrier. Pt alone in the car. SLP told pt SLP would call pt when he reached home (approx 5-10 minutes) and would talk with him voice to voice to set up another appointment time when son can be present.  SLP called pt at approx 11:30 and set up 07-16-19 at 11:45 AM for Webex with son, Evelena Peat. Evelena Peat was present and confirmed this time would work with him.  [facility] ,[place], [place]  07/02/2019, 12:57 PM  [facility] [address] [place], [place], [address] Phone: [phone]   Fax:  [phone]

## 2019-07-16 ENCOUNTER — Ambulatory Visit: Payer: Self-pay | Attending: Radiation Oncology

## 2019-08-11 ENCOUNTER — Encounter (INDEPENDENT_AMBULATORY_CARE_PROVIDER_SITE_OTHER): Payer: Self-pay

## 2019-08-16 ENCOUNTER — Ambulatory Visit: Payer: Self-pay | Attending: Radiation Oncology

## 2019-08-20 ENCOUNTER — Ambulatory Visit (INDEPENDENT_AMBULATORY_CARE_PROVIDER_SITE_OTHER): Payer: Self-pay | Admitting: Otolaryngology

## 2019-08-21 ENCOUNTER — Ambulatory Visit (INDEPENDENT_AMBULATORY_CARE_PROVIDER_SITE_OTHER): Payer: Self-pay | Admitting: Otolaryngology

## 2019-08-23 ENCOUNTER — Other Ambulatory Visit: Payer: Self-pay

## 2019-08-23 ENCOUNTER — Encounter (INDEPENDENT_AMBULATORY_CARE_PROVIDER_SITE_OTHER): Payer: Self-pay | Admitting: Otolaryngology

## 2019-08-23 ENCOUNTER — Ambulatory Visit (INDEPENDENT_AMBULATORY_CARE_PROVIDER_SITE_OTHER): Payer: Self-pay | Admitting: Otolaryngology

## 2019-08-23 VITALS — Temp 97.3°F

## 2019-08-23 DIAGNOSIS — Z8521 Personal history of malignant neoplasm of larynx: Secondary | ICD-10-CM

## 2019-08-23 NOTE — Progress Notes (Signed)
HPI: Juan Lewis is a 48 y.o. 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  He completed radiation therapy in August.  His voice has gradually gotten better.  He does complain of a little bit of a dry throat but no trouble swallowing. He has stopped smoking.  He inquires about drinking wine which should be okay.  Past Medical History:  Diagnosis Date  . Hoarseness   . Lesion of vocal cord    Past Surgical History:  Procedure Laterality Date  . MICROLARYNGOSCOPY N/A 03/23/2019   Procedure: MICROLARYNGOSCOPY WITH EXCISION OF VOCAL CORD LESION;  Surgeon: Rozetta Nunnery, MD;  Location: [facility];  Service: ENT;  Laterality: N/A;   Social History   Socioeconomic History  . Marital status: Married    Spouse name: Not on file  . Number of children: Not on file  . Years of education: Not on file  . Highest education level: Not on file  Occupational History  . Not on file  Social Needs  . Financial resource strain: Not on file  . Food insecurity    Worry: Not on file    Inability: Not on file  . Transportation needs    Medical: No    Non-medical: No  Tobacco Use  . Smoking status: Former Smoker    Packs/day: 0.50    Years: 30.00    Pack years: 15.00    Start date: 37    Quit date: 04/20/2019    Years since quitting: 0.3  . Smokeless tobacco: Never Used  . Tobacco comment: he quit when he started radiation. 06/15/19  Substance and Sexual Activity  . Alcohol use: Not Currently    Comment: social  . Drug use: No  . Sexual activity: Not on file  Lifestyle  . Physical activity    Days per week: Not on file    Minutes per session: Not on file  . Stress: Not on file  Relationships  . Social Herbalist on phone: Not on file    Gets together: Not on file    Attends religious service: Not on file    Active member of club or organization: Not on file    Attends meetings of clubs or  organizations: Not on file    Relationship status: Not on file  Other Topics Concern  . Not on file  Social History Narrative  . Not on file   No family history on file. No Known Allergies Prior to Admission medications   Medication Sig Start Date End Date Taking? Authorizing Provider  docusate sodium (COLACE) 100 MG capsule Take 2 capsules (200 mg total) by mouth daily. This will help prevent constipation while you are taking Hycet pain medicaiton. 05/09/19   Eppie Gibson, MD  HYDROcodone-acetaminophen (NORCO/VICODIN) 5-325 MG tablet Take 1-2 tablets by mouth every 4 (four) hours as needed for moderate pain. Patient not taking: Reported on 06/15/2019 05/28/19   Eppie Gibson, MD  lidocaine (XYLOCAINE) 2 % solution Patient: Mix 1part 2% viscous lidocaine, 1part H20. Swallow 11mL of diluted mixture, up to QID prn sore throat; may use 20 min before meals. 05/21/19   Eppie Gibson, MD  loratadine (CLARITIN) 10 MG tablet TK 1 T PO QD IN THE MORNING 02/06/19   [provider]  naproxen (NAPROSYN) 500 MG tablet TK 1 T PO BID PRN 12/08/18   [provider]  omeprazole (PRILOSEC) 40 MG capsule Take  1 capsule (40 mg total) by mouth daily for 14 days. 03/23/19 04/06/19  Rozetta Nunnery, MD     Positive ROS: Is otherwise negative.  All other systems have been reviewed and were otherwise negative with the exception of those mentioned in the HPI and as above.  Physical Exam: General: Alert, no acute distress Ears: Ear canals are clear bilaterally with intact, clear TMs Nasal: Clear nasal passages Oral: Clear oropharynx Neck: No palpable adenopathy or masses Fiberoptic laryngoscopy was performed in the office today through the left nostril.  He had moderate supraglottic edema.  Vocal cords were clear bilaterally especially the anterior vocal cords and anterior commissure.  Vocal cords had symmetric mobility.  Laryngoscopy  Date/Time: 08/23/2019 1:51 PM Performed by: Rozetta Nunnery, MD Authorized by: Rozetta Nunnery, MD   Consent:    Consent obtained:  Verbal   Consent given by:  Patient Procedure details:    Indications: oncologic surveillance follow-up     Medication:  Afrin   Instrument: flexible fiberoptic laryngoscope     Scope location: left nare   Mouth:    Oropharynx: normal     Vallecula: normal     Base of tongue: normal     Epiglottis: normal   Throat:    Right hypopharynx: normal     Left hypopharynx: normal     Pyriform sinus: normal     inflammation     True vocal cords: normal   Post-procedure details:    Patient tolerance of procedure:  Tolerated well, no immediate complications Comments:     No signs of recurrence.  Moderate supraglottic edema.    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 No evidence of recurrence  Plan: He will follow-up in 6 months for recheck   Radene Journey, MD

## 2019-09-28 DIAGNOSIS — R972 Elevated prostate specific antigen [PSA]: Secondary | ICD-10-CM | POA: Insufficient documentation

## 2019-10-12 DIAGNOSIS — C61 Malignant neoplasm of prostate: Secondary | ICD-10-CM

## 2019-10-12 HISTORY — DX: Malignant neoplasm of prostate: C61

## 2019-11-08 ENCOUNTER — Other Ambulatory Visit (HOSPITAL_COMMUNITY): Payer: Self-pay | Admitting: Urology

## 2019-11-08 ENCOUNTER — Other Ambulatory Visit: Payer: Self-pay | Admitting: Urology

## 2019-11-08 DIAGNOSIS — C61 Malignant neoplasm of prostate: Secondary | ICD-10-CM

## 2019-11-16 ENCOUNTER — Encounter (HOSPITAL_COMMUNITY)
Admission: RE | Admit: 2019-11-16 | Discharge: 2019-11-16 | Disposition: A | Discharge status: Home / Self Care | Payer: Self-pay | Source: Ambulatory Visit | Attending: Urology | Admitting: Urology

## 2019-11-16 ENCOUNTER — Other Ambulatory Visit: Payer: Self-pay

## 2019-11-16 ENCOUNTER — Ambulatory Visit (HOSPITAL_COMMUNITY)
Admission: RE | Admit: 2019-11-16 | Discharge: 2019-11-16 | Disposition: A | Discharge status: Home / Self Care | Payer: Self-pay | Source: Ambulatory Visit | Attending: Urology | Admitting: Urology

## 2019-11-16 DIAGNOSIS — C61 Malignant neoplasm of prostate: Secondary | ICD-10-CM | POA: Insufficient documentation

## 2019-11-16 LAB — POCT I-STAT CREATININE: Creatinine, Ser: 1 mg/dL (ref 0.61–1.24)

## 2019-11-16 MED ORDER — TECHNETIUM TC 99M MEDRONATE IV KIT
20.0000 | PACK | Freq: Once | INTRAVENOUS | Status: AC | PRN
  Administered 2019-11-16: 09:00:00 20 via INTRAVENOUS

## 2019-11-16 MED ORDER — IOHEXOL 300 MG/ML  SOLN
100.0000 mL | Freq: Once | INTRAMUSCULAR | Status: AC | PRN
  Administered 2019-11-16: 100 mL via INTRAVENOUS

## 2019-11-16 MED ORDER — SODIUM CHLORIDE (PF) 0.9 % IJ SOLN
INTRAMUSCULAR | Status: AC
  Filled 2019-11-16: qty 50

## 2019-11-16 MED ORDER — FLUDEOXYGLUCOSE F - 18 (FDG) INJECTION: 20.0000 | Freq: Once | INTRAVENOUS | Status: DC | PRN

## 2019-11-22 ENCOUNTER — Encounter: Payer: Self-pay | Admitting: *Deleted

## 2019-12-04 ENCOUNTER — Encounter: Payer: Self-pay | Admitting: Radiation Oncology

## 2019-12-04 ENCOUNTER — Other Ambulatory Visit: Payer: Self-pay

## 2019-12-04 ENCOUNTER — Ambulatory Visit
Admission: RE | Admit: 2019-12-04 | Discharge: 2019-12-04 | Disposition: A | Discharge status: Home / Self Care | Payer: Self-pay | Source: Ambulatory Visit | Attending: Radiation Oncology | Admitting: Radiation Oncology

## 2019-12-04 VITALS — Ht 68.0 in | Wt 164.0 lb

## 2019-12-04 DIAGNOSIS — C61 Malignant neoplasm of prostate: Secondary | ICD-10-CM | POA: Insufficient documentation

## 2019-12-04 DIAGNOSIS — C320 Malignant neoplasm of glottis: Secondary | ICD-10-CM

## 2019-12-04 HISTORY — DX: Malignant neoplasm of prostate: C61

## 2019-12-04 NOTE — Progress Notes (Signed)
Radiation Oncology         [phone] ________________________________  Initial outpatient Consultation - Conducted via Telephone due to current COVID-19 concerns for limiting patient exposure  Name: Juan Lewis MRN: [id number]  Date: 12/04/2019  DOB: [birth year]  EX:[id number], No Pcp Per  Lucas Mallow, MD   REFERRING PHYSICIAN: Marton Redwood III, MD  DIAGNOSIS: 49 y.o. gentleman with Stage T1c adenocarcinoma of the prostate with Gleason score of 4+3, and PSA of 16.8.    ICD-10-CM   1. Malignant neoplasm of prostate ([place])  C61   2. 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     HISTORY OF PRESENT ILLNESS: Juan Lewis is a 49 y.o. 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 He was previously treated with a six week course of daily radiation under the care and direction of Dr. Isidore Moos, completed in 05/2019 and has continued in surveillance with Dr. Lucia Gaskins with no evidence of recurrent disease.  He was recently noted to have an elevated PSA of 17.4 by his primary care physician and on repeat, it remained elevated at 16.8.  Accordingly, he was referred for evaluation in urology by Dr. Claudia Desanctis on 10/15/2019,  digital rectal examination was performed at that time revealing no nodules.  The patient proceeded to transrectal ultrasound with 12 biopsies of the prostate on 10/29/2019.  The prostate volume measured 38.67 cc.  Out of 12 core biopsies, 8 were positive.  The maximum Gleason score was 4+3, and this was seen in the right mid, left base (small focus), and left mid. Additionally, Gleason 3+4 was seen in the right mid lateral and Gleason 3+3 was seen in the right apex lateral, right apex, left apex lateral (two small foci), and right base (small focus).  He underwent CT abdomen/pelvis on 11/16/2019 for disease staging and this was without any evidence of visceral or osseous metastatic disease. A Bone scan performed the  same day showed likely degenerative changes within the cervical and thoracic spine without evidence of metastatic disease.  He met with Dr. Gloriann Loan on 11/21/2019 to discuss prostatectomy. His biggest concern with pursuing surgery is cost and he is therefore leaning towards proceeding with radiation for treatment of his prostate cancer.  The patient reviewed the biopsy results with his urologist and he has kindly been referred today for discussion of potential radiation treatment options.  PREVIOUS RADIATION THERAPY: Yes  04/23/2019 - 05/31/2019: Larynx / 65.25 Gy in 29 fractions (Dr. Isidore Moos)  PAST MEDICAL HISTORY:  Past Medical History:  Diagnosis Date  . Hoarseness   . Lesion of vocal cord   . Prostate cancer ([place])       PAST SURGICAL HISTORY: Past Surgical History:  Procedure Laterality Date  . MICROLARYNGOSCOPY N/A 03/23/2019   Procedure: MICROLARYNGOSCOPY WITH EXCISION OF VOCAL CORD LESION;  Surgeon: Rozetta Nunnery, MD;  Location: [place];  Service: ENT;  Laterality: N/A;  . PROSTATE BIOPSY      FAMILY HISTORY:  Family History  Problem Relation Age of Onset  . Prostate cancer Neg Hx   . Breast cancer Neg Hx   . Colon cancer Neg Hx   . Pancreatic cancer Neg Hx     SOCIAL HISTORY:  Social History   Socioeconomic History  . Marital status: Married    Spouse name: Juan Lewis   . Number of children: 4  . Years of education: Not on file  .  Highest education level: Not on file  Occupational History  . Not on file  Tobacco Use  . Smoking status: Former Smoker    Packs/day: 0.50    Years: 30.00    Pack years: 15.00    Start date: 8    Quit date: 04/20/2019    Years since quitting: 0.6  . Smokeless tobacco: Never Used  . Tobacco comment: he quit when he started radiation. 06/15/19  Substance and Sexual Activity  . Alcohol use: Not Currently    Comment: social  . Drug use: No  . Sexual activity: Not Currently  Other Topics Concern  . Not on file   Social History Narrative  . Not on file   Social Determinants of Health   Financial Resource Strain:   . Difficulty of Paying Living Expenses: Not on file  Food Insecurity:   . Worried About Charity fundraiser in the Last Year: Not on file  . Ran Out of Food in the Last Year: Not on file  Transportation Needs: No Transportation Needs  . Lack of Transportation (Medical): No  . Lack of Transportation (Non-Medical): No  Physical Activity:   . Days of Exercise per Week: Not on file  . Minutes of Exercise per Session: Not on file  Stress:   . Feeling of Stress : Not on file  Social Connections:   . Frequency of Communication with Friends and Family: Not on file  . Frequency of Social Gatherings with Friends and Family: Not on file  . Attends Religious Services: Not on file  . Active Member of Clubs or Organizations: Not on file  . Attends Archivist Meetings: Not on file  . Marital Status: Not on file  Intimate Partner Violence: Not At Risk  . Fear of Current or Ex-Partner: No  . Emotionally Abused: No  . Physically Abused: No  . Sexually Abused: No    ALLERGIES: Patient has no known allergies.  MEDICATIONS:  Current Outpatient Medications  Medication Sig Dispense Refill  . tamsulosin (FLOMAX) 0.4 MG CAPS capsule Take 0.4 mg by mouth daily.     No current facility-administered medications for this encounter.    REVIEW OF SYSTEMS:  On review of systems, the patient reports that he is doing well overall. He denies any chest pain, shortness of breath, cough, fevers, chills, night sweats, unintended weight changes. He denies any bowel disturbances, and denies abdominal pain, nausea or vomiting. He denies any new musculoskeletal or joint aches or pains. His IPSS was 7, indicating mild to moderate urinary symptoms. His SHIM was 16, indicating he has moderate erectile dysfunction. A complete review of systems is obtained and is otherwise negative.    PHYSICAL EXAM:  Wt  Readings from Last 3 Encounters:  12/04/19 164 lb (74.4 kg)  06/15/19 152 lb 9.6 oz (69.2 kg)  03/23/19 153 lb 14.1 oz (69.8 kg)   Temp Readings from Last 3 Encounters:  08/23/19 (!) 97.3 F (36.3 C) (Tympanic)  06/15/19 99.4 F (37.4 C) (Tympanic)  04/23/19 98.7 F (37.1 C) (Oral)   BP Readings from Last 3 Encounters:  06/15/19 103/76  04/23/19 120/87  03/23/19 (!) 143/98   Pulse Readings from Last 3 Encounters:  06/15/19 78  04/23/19 70  03/23/19 70   Pain Assessment Pain Score: 0-No pain/10  Physical exam not performed in light of telephone consult visit format.   KPS = 90  100 - Normal; no complaints; no evidence of disease. 90   - Able  to carry on normal activity; minor signs or symptoms of disease. 80   - Normal activity with effort; some signs or symptoms of disease. 42   - Cares for self; unable to carry on normal activity or to do active work. 60   - Requires occasional assistance, but is able to care for most of his personal needs. 50   - Requires considerable assistance and frequent medical care. 42   - Disabled; requires special care and assistance. 41   - Severely disabled; hospital admission is indicated although death not imminent. 33   - Very sick; hospital admission necessary; active supportive treatment necessary. 10   - Moribund; fatal processes progressing rapidly. 0     - Dead  Karnofsky DA, Abelmann Gates, Craver LS and Burchenal [facility] [phone]) The use of the nitrogen mustards in the palliative treatment of carcinoma: with particular reference to bronchogenic carcinoma Cancer 1 634-56  LABORATORY DATA:  Lab Results  Component Value Date   WBC 4.0 12/05/2017   HGB 13.5 12/05/2017   HCT 41.5 12/05/2017   MCV 88.7 12/05/2017   PLT 239 12/05/2017   Lab Results  Component Value Date   NA 139 12/05/2017   K 4.1 12/05/2017   CL 103 12/05/2017   CO2 25 12/05/2017   No results found for: ALT, AST, GGT, ALKPHOS, BILITOT   RADIOGRAPHY: CT ABDOMEN  PELVIS W WO CONTRAST  Result Date: 11/16/2019 CLINICAL DATA:  Prostate cancer, no history of treatment. EXAM: CT ABDOMEN AND PELVIS WITHOUT AND WITH CONTRAST TECHNIQUE: Multidetector CT imaging of the abdomen and pelvis was performed following the standard protocol before and following the bolus administration of intravenous contrast. CONTRAST:  125m OMNIPAQUE IOHEXOL 300 MG/ML  SOLN COMPARISON:  None FINDINGS: Lower chest: Similar appearance of bandlike opacity in the right middle lobe. No signs of dense consolidation or evidence of pleural effusion. Mild interstitial thickening and patchy ground-glass at the lung bases is similar to the prior study. Hepatobiliary: Cyst in the left hemi liver anteriorly. Small cyst in the right inferior [place] lobe. No signs of pericholecystic stranding. No signs of biliary ductal dilation. Pancreas: Pancreas is normal without focal lesion or ductal dilation. Spleen: Spleen is normal. Adrenals/Urinary Tract: Adrenal glands are normal size without focal lesion. The symmetric renal enhancement. No signs of hydronephrosis. No signs of nephrolithiasis. Stomach/Bowel: Stomach and small bowel with normal appearance, no signs of bowel obstruction. Normal appendix. The colon is largely stool filled. Vascular/Lymphatic: Vascular structures in the abdomen are patent. No signs of adenopathy in the retroperitoneum or upper abdomen. No signs of pelvic lymphadenopathy. Reproductive: Marked heterogeneity of the prostate which is moderately enlarged measuring 5.6 x 4.2 cm in greatest axial dimension. Other: Small fat containing umbilical hernia. No signs of bowel involvement. No signs of free air or ascites. Musculoskeletal: No acute bone finding or evidence of destructive bone process. IMPRESSION: 1. No evidence of metastatic disease in the abdomen or pelvis. 2. Marked heterogeneity of the prostate which is moderately enlarged. Right middle lobe scarring and areas of air trapping in the lung  bases with septal thickening and ground-glass, consider the possibility of previous infection and scarring or early interstitial lung disease. Consider follow-up high-resolution chest CT based on clinical symptoms. Electronically Signed   By: GZetta BillsM.D.   On: 11/16/2019 09:01   NM Bone Scan Whole Body  Result Date: 11/16/2019 CLINICAL DATA:  Prostate cancer, PSA equals 17.4 EXAM: NUCLEAR MEDICINE WHOLE BODY BONE SCAN TECHNIQUE: Whole body anterior  and posterior images were obtained approximately 3 hours after intravenous injection of radiopharmaceutical. RADIOPHARMACEUTICALS:  20 mCi Technetium-44mMDP IV COMPARISON:  04/03/2019, 11/16/2019 FINDINGS: Anterior and posterior whole body planar images are obtained after radiotracer administration. Physiologic excretion of radiotracer within the kidneys and bladder. Focal radiotracer activity is seen within the posterior elements on the left in the mid cervical spine and at the costovertebral junction at approximately T11, likely degenerative. No abnormal radiotracer deposition to suggest an acute or destructive process. No evidence of metastatic disease. IMPRESSION: 1. Likely degenerative changes within the cervical and thoracic spine as above. 2. No evidence of metastatic disease. Electronically Signed   By: MRanda NgoM.D.   On: 11/16/2019 17:02      IMPRESSION/PLAN: This visit was conducted via Telephone to spare the patient unnecessary potential exposure in the healthcare setting during the current COVID-19 pandemic. 1. 49y.o. gentleman with Stage T1c adenocarcinoma of the prostate with Gleason Score of 4+3, and PSA of 16.8. We discussed the patient's workup and outlined the nature of prostate cancer in this setting. The patient's T stage, Gleason's score, and PSA put him into the unfavorable intermediate risk group. Accordingly, he is eligible for a variety of potential treatment options including brachytherapy, 5.5 weeks of external radiation  or prostatectomy. We discussed the available radiation techniques, and focused on the details and logistics of delivery. He expressed that he has a 519year old son that he takes care of, making brachytherapy a much less appealing option due to concerns for exposure. We discussed and outlined the risks, benefits, short and long-term effects associated with radiotherapy and compared and contrasted these with prostatectomy. We discussed the role of SpaceOAR in reducing the rectal toxicity associated with radiotherapy. He was encouraged to ask questions that were answered to his stated satisfaction.  At the end of the conversation, the patient is interested in moving forward with 5.5 weeks of external beam therapy. We will share our discussion with Dr. PClaudia Desanctisand help coordinate for fiducial markers and SpaceOAR gel placement, first available,  prior to simulation, to reduce rectal toxicity from radiotherapy. He is comformtable and in agreement with the stated plan so we will move forward with treatment planning in anticipation of beginning IMRT in the near future.  Given current concerns for patient exposure during the COVID-19 pandemic, this encounter was conducted via telephone. The patient was notified in advance and was offered a MyChart meeting to allow for face to face communication but unfortunately reported that he did not have the appropriate resources/technology to support such a visit and instead preferred to proceed with telephone consult. The patient has given verbal consent for this type of encounter. The time spent during this encounter was 45 minutes. The attendants for this meeting include MTyler PitaMD, Ashlyn Bruning PA-C, [place] patient, ACallum Wolfand his wife. During the encounter, MTyler PitaMD, Ashlyn Bruning PA-C, and scribe, KWilburn Mylarwere located at [facility]  Patient, AJamale Spanglerand his wife  were located at home.    ANicholos Johns PA-C    MTyler Pita MD  [facility]Oncology Direct Dial: [phone] Fax: [phone]  Skype  LinkedIn  This document serves as a record of services personally performed by MTyler Pita MD and AFreeman Caldron PA-C. It was created on their behalf by KWilburn Mylar a trained medical scribe. The creation of this record is based on the scribe's personal observations  and the provider's statements to them. This document has been checked and approved by the attending provider.

## 2019-12-04 NOTE — Progress Notes (Signed)
GU Location of Tumor / Histology: prostatic adenocarcinoma  If Prostate Cancer, Gleason Score is (4 + 3) and PSA is (17.4) on 08/2019. Prostate volume: 38.67 grams.  Kais Freundlich with history of glottic cancer treated with xrt. Patient completed xrt under the care of Dr. Isidore Moos in 05/2019/ Patient continues to be followed by Dr. Isidore Moos and Dr. Lucia Gaskins.   During routine physical an elevated PSA of 17 noted thus patient was referred to Dr. Arnette Schaumann. After confirming prostate cancer Dr. Claudia Desanctis referred patient to Dr. Gloriann Loan to discuss surgery then Dr. Tammi Klippel to discuss radiation.  Biopsies of prostate (if applicable) revealed:    Past/Anticipated interventions by urology, if any: prostate biopsy, CT abd/pelvis (negative), bone scan (negative), referral to Dr. Gloriann Loan to discuss prostatectomy, referral to Dr. Tammi Klippel to discuss radiotherapy options  Past/Anticipated interventions by medical oncology, if any: none  Weight changes, if any: denies  Bowel/Bladder complaints, if any: IPSS 7. SHIM 16. Denies dysuria, hematuria, urinary leakage or incontinence. Denies any bowel complaints.   Nausea/Vomiting, if any: denies  Pain issues, if any:  denies  SAFETY ISSUES:  Prior radiation? Yes to throat  Pacemaker/ICD? no  Possible current pregnancy? no, male patient  Is the patient on methotrexate? no  Current Complaints / other details:  49 year old male. Married. Social drinker.

## 2019-12-11 ENCOUNTER — Encounter: Payer: Self-pay | Admitting: Medical Oncology

## 2019-12-11 ENCOUNTER — Telehealth: Payer: Self-pay | Admitting: *Deleted

## 2019-12-11 NOTE — Telephone Encounter (Signed)
Called patient to inform that I called [facility] and am waiting for them to call me with an appt. date and time for fid. Markers and space oar gel placement, lvm for a return call

## 2019-12-25 ENCOUNTER — Telehealth: Payer: Self-pay | Admitting: Radiation Oncology

## 2019-12-25 ENCOUNTER — Telehealth: Payer: Self-pay | Admitting: *Deleted

## 2019-12-25 ENCOUNTER — Telehealth: Payer: Self-pay | Admitting: Medical Oncology

## 2019-12-25 NOTE — Telephone Encounter (Signed)
Returned call to patient. He is concerned because he has not been given an appointment to get his gold markers and SpaceOar. He is scheduled 4/22 @ 1:30 pm at [facility]. He wrote this appointment down and voiced back to me. I told him he should received a call or letter with instructions for this procedure. I informed him, Enid Derry will call him with appointment for CT simulation. I asked him to call me with questions or concerns. He voiced understanding.

## 2019-12-25 NOTE — Telephone Encounter (Signed)
Received voicemail message from patient requesting a return call from Dr. Tammi Klippel. Phoned patient back promptly to inquire. No answer. Left voicemail with my direct number. Awaiting return call.

## 2019-12-25 NOTE — Telephone Encounter (Signed)
Called patient to inform of fid, marker and space oar placement on 01-31-20 @ [facility] and his sim on April 30- arrival time- 9:45 am @ Dr. Johny Shears Office, spoke with patient and he is aware of these appts.

## 2019-12-25 NOTE — Progress Notes (Signed)
Spoke with patient to introduce myself as the prostate nurse navigator and discuss my role. I was unable to meet him 2/23, when he consulted with Dr. Tammi Klippel. There is a bit of a language barrier and I had to repeatedly ask him questions.He has chosen 5/5 weeks of radiation and is aware Enid Derry will contact him with appointments.  I gave him my contact information and asked him to call me with questions or concerns.

## 2019-12-26 ENCOUNTER — Telehealth: Payer: Self-pay | Admitting: *Deleted

## 2019-12-26 NOTE — Telephone Encounter (Signed)
Called patient to inform of fid. marker and space oar placement on 01-31-20 @ [facility] and his sim on April 30- arrival time- 9:45 am @ [facility], spoke with patient and he is aware of these appts.

## 2019-12-27 ENCOUNTER — Other Ambulatory Visit: Payer: Self-pay | Admitting: Urology

## 2019-12-27 DIAGNOSIS — C61 Malignant neoplasm of prostate: Secondary | ICD-10-CM

## 2020-01-24 ENCOUNTER — Ambulatory Visit (INDEPENDENT_AMBULATORY_CARE_PROVIDER_SITE_OTHER): Payer: Self-pay | Admitting: Otolaryngology

## 2020-01-24 ENCOUNTER — Other Ambulatory Visit: Payer: Self-pay

## 2020-01-24 VITALS — Temp 97.3°F

## 2020-01-24 DIAGNOSIS — Z8521 Personal history of malignant neoplasm of larynx: Secondary | ICD-10-CM

## 2020-01-24 NOTE — Progress Notes (Signed)
HPI: Juan Lewis is a 49 y.o. 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  He completed his radiation therapy in August of last year.  He has a follow-up appointment with Dr. Isidore Moos in September of this year.  His voice is doing well. He does not smoke.  He had previously smoked about a half a pack a day..  Past Medical History:  Diagnosis Date  . Hoarseness   . Lesion of vocal cord   . Prostate cancer [facility])    Past Surgical History:  Procedure Laterality Date  . MICROLARYNGOSCOPY N/A 03/23/2019   Procedure: MICROLARYNGOSCOPY WITH EXCISION OF VOCAL CORD LESION;  Surgeon: Rozetta Nunnery, MD;  Location: [place];  Service: ENT;  Laterality: N/A;  . PROSTATE BIOPSY     Social History   Socioeconomic History  . Marital status: Married    Spouse name: [name]   . Number of children: 4  . Years of education: Not on file  . Highest education level: Not on file  Occupational History  . Not on file  Tobacco Use  . Smoking status: Former Smoker    Packs/day: 0.50    Years: 30.00    Pack years: 15.00    Start date: 34    Quit date: 04/20/2019    Years since quitting: 0.7  . Smokeless tobacco: Never Used  . Tobacco comment: he quit when he started radiation. 06/15/19  Substance and Sexual Activity  . Alcohol use: Not Currently    Comment: social  . Drug use: No  . Sexual activity: Not Currently  Other Topics Concern  . Not on file  Social History Narrative  . Not on file   Social Determinants of Health   Financial Resource Strain:   . Difficulty of Paying Living Expenses:   Food Insecurity:   . Worried About Charity fundraiser in the Last Year:   . Arboriculturist in the Last Year:   Transportation Needs: No Transportation Needs  . Lack of Transportation (Medical): No  . Lack of Transportation (Non-Medical): No  Physical Activity:   . Days of Exercise per Week:   .  Minutes of Exercise per Session:   Stress:   . Feeling of Stress :   Social Connections:   . Frequency of Communication with Friends and Family:   . Frequency of Social Gatherings with Friends and Family:   . Attends Religious Services:   . Active Member of Clubs or Organizations:   . Attends Archivist Meetings:   [name] Marital Status:    Family History  Problem Relation Age of Onset  . Prostate cancer Neg Hx   . Breast cancer Neg Hx   . Colon cancer Neg Hx   . Pancreatic cancer Neg Hx    No Known Allergies Prior to Admission medications   Medication Sig Start Date End Date Taking? Authorizing Provider  tamsulosin (FLOMAX) 0.4 MG CAPS capsule Take 0.4 mg by mouth daily. 09/22/19   [provider]     Positive ROS: Otherwise negative  All other systems have been reviewed and were otherwise negative with the exception of those mentioned in the HPI and as above.  Physical Exam: Constitutional: Alert, well-appearing, no acute distress.  Voice is good. Ears: External ears without lesions or tenderness. Ear canals are clear bilaterally with intact, clear TMs.  Nasal: External nose without lesions. Septum midline with  mild rhinitis.. Clear nasal passages Fiberoptic laryngoscopy through the left nostril revealed a clear nasopharynx.  Base of tongue vallecula epiglottis were normal.  Vocal cords were clear bilaterally.  Anterior commissure was clear.  Both cords had normal mobility. Oral: Lips and gums without lesions. Tongue and palate mucosa without lesions. Posterior oropharynx clear. Neck: No palpable adenopathy or masses.  No palpable adenopathy on either side of the neck. Respiratory: Breathing comfortably  Skin: No facial/neck lesions or rash noted.  Laryngoscopy  Date/Time: 01/24/2020 2:48 PM Performed by: Rozetta Nunnery, MD Authorized by: Rozetta Nunnery, MD   Consent:    Consent obtained:  Verbal   Consent given by:  Patient   Risks  discussed:  Pain Procedure details:    Indications: oncologic surveillance follow-up     Medication:  Afrin   Scope location: left nare   Sinus:    Left nasopharynx: normal   Mouth:    Vallecula: normal     Base of tongue: normal     Epiglottis: normal   Throat:    True vocal cords: normal   Comments:     On fiberoptic laryngoscopy vocal cords were clear bilaterally with normal vocal cord movement.  No evidence of neoplasia or abnormality.    Assessment: History of T1N0 right true vocal cord cancer.  Plan: Reassured him of normal laryngeal examination in the office today. He is scheduled to see Dr. Isidore Moos in September. He will follow up here in 7 to 8 months.   Radene Journey, MD

## 2020-02-07 ENCOUNTER — Telehealth: Payer: Self-pay | Admitting: *Deleted

## 2020-02-07 NOTE — Telephone Encounter (Signed)
Called patient to remind of sim and MRI appt. for 02-08-20, lvm for a return call

## 2020-02-08 ENCOUNTER — Other Ambulatory Visit: Payer: Self-pay

## 2020-02-08 ENCOUNTER — Ambulatory Visit (HOSPITAL_COMMUNITY)
Admission: RE | Admit: 2020-02-08 | Discharge: 2020-02-08 | Disposition: A | Discharge status: Home / Self Care | Payer: BC Managed Care – PPO | Source: Ambulatory Visit | Attending: Urology | Admitting: Urology

## 2020-02-08 ENCOUNTER — Encounter: Payer: Self-pay | Admitting: Medical Oncology

## 2020-02-08 ENCOUNTER — Ambulatory Visit
Admission: RE | Admit: 2020-02-08 | Discharge: 2020-02-08 | Disposition: A | Discharge status: Home / Self Care | Payer: BC Managed Care – PPO | Source: Ambulatory Visit | Attending: Radiation Oncology | Admitting: Radiation Oncology

## 2020-02-08 DIAGNOSIS — C61 Malignant neoplasm of prostate: Secondary | ICD-10-CM | POA: Diagnosis not present

## 2020-02-08 NOTE — Progress Notes (Signed)
  Radiation Oncology         [phone] ________________________________  Name: Juan Lewis MRN: [id number]  Date: 02/08/2020  DOB: 09-22-1971  SIMULATION AND TREATMENT PLANNING NOTE    ICD-10-CM   1. Malignant neoplasm of prostate ([place])  C61     DIAGNOSIS:  49 y.o. gentleman with Stage T1c adenocarcinoma of the prostate with Gleason score of 4+3, and PSA of 16.8.  NARRATIVE:  The patient was brought to the [facility].  Identity was confirmed.  All relevant records and images related to the planned course of therapy were reviewed.  The patient freely provided informed written consent to proceed with treatment after reviewing the details related to the planned course of therapy. The consent form was witnessed and verified by the simulation staff.  Then, the patient was set-up in a stable reproducible supine position for radiation therapy.  A vacuum lock pillow device was custom fabricated to position his legs in a reproducible immobilized position.  Then, I performed a urethrogram under sterile conditions to identify the prostatic apex.  CT images were obtained.  Surface markings were placed.  The CT images were loaded into the planning software.  Then the prostate target and avoidance structures including the rectum, bladder, bowel and hips were contoured.  Treatment planning then occurred.  The radiation prescription was entered and confirmed.  A total of one complex treatment devices was fabricated. I have requested : Intensity Modulated Radiotherapy (IMRT) is medically necessary for this case for the following reason:  Rectal sparing.[name]  PLAN:  The patient will receive 70 Gy in 28 fractions.  ________________________________  Sheral Apley Tammi Klippel, M.D.

## 2020-02-08 NOTE — Progress Notes (Signed)
Juan Lewis, here for CT simulation. He states he completed radiation last year for head and neck cancer so he familiar with [facility]. He will begin radiation 5/11. He needs FMLA forms completed for his wife. Fax number provided and asked him to fax for completion.

## 2020-02-13 DIAGNOSIS — C61 Malignant neoplasm of prostate: Secondary | ICD-10-CM | POA: Diagnosis present

## 2020-02-19 ENCOUNTER — Encounter: Payer: Self-pay | Admitting: Medical Oncology

## 2020-02-19 ENCOUNTER — Other Ambulatory Visit: Payer: Self-pay

## 2020-02-19 ENCOUNTER — Ambulatory Visit
Admission: RE | Admit: 2020-02-19 | Discharge: 2020-02-19 | Disposition: A | Discharge status: Home / Self Care | Payer: BC Managed Care – PPO | Source: Ambulatory Visit | Attending: Radiation Oncology | Admitting: Radiation Oncology

## 2020-02-19 ENCOUNTER — Encounter: Payer: Self-pay | Admitting: Radiation Oncology

## 2020-02-19 DIAGNOSIS — C61 Malignant neoplasm of prostate: Secondary | ICD-10-CM | POA: Diagnosis not present

## 2020-02-19 NOTE — Progress Notes (Signed)
Received FMLA paperwork in need of completion for patent's wife, Rosida Apollus. Patient understands the turn around time on such paperwork is 7-10 days.

## 2020-02-20 ENCOUNTER — Other Ambulatory Visit: Payer: Self-pay

## 2020-02-20 ENCOUNTER — Ambulatory Visit
Admission: RE | Admit: 2020-02-20 | Discharge: 2020-02-20 | Disposition: A | Discharge status: Home / Self Care | Payer: BC Managed Care – PPO | Source: Ambulatory Visit | Attending: Radiation Oncology | Admitting: Radiation Oncology

## 2020-02-20 DIAGNOSIS — C61 Malignant neoplasm of prostate: Secondary | ICD-10-CM | POA: Diagnosis not present

## 2020-02-21 ENCOUNTER — Ambulatory Visit
Admission: RE | Admit: 2020-02-21 | Discharge: 2020-02-21 | Disposition: A | Discharge status: Home / Self Care | Payer: BC Managed Care – PPO | Source: Ambulatory Visit | Attending: Radiation Oncology | Admitting: Radiation Oncology

## 2020-02-21 ENCOUNTER — Other Ambulatory Visit: Payer: Self-pay

## 2020-02-21 DIAGNOSIS — C61 Malignant neoplasm of prostate: Secondary | ICD-10-CM | POA: Diagnosis not present

## 2020-02-22 ENCOUNTER — Ambulatory Visit
Admission: RE | Admit: 2020-02-22 | Discharge: 2020-02-22 | Disposition: A | Discharge status: Home / Self Care | Payer: BC Managed Care – PPO | Source: Ambulatory Visit | Attending: Radiation Oncology | Admitting: Radiation Oncology

## 2020-02-22 ENCOUNTER — Other Ambulatory Visit: Payer: Self-pay

## 2020-02-22 DIAGNOSIS — C61 Malignant neoplasm of prostate: Secondary | ICD-10-CM | POA: Diagnosis not present

## 2020-02-25 ENCOUNTER — Ambulatory Visit
Admission: RE | Admit: 2020-02-25 | Discharge: 2020-02-25 | Disposition: A | Discharge status: Home / Self Care | Payer: BC Managed Care – PPO | Source: Ambulatory Visit | Attending: Radiation Oncology | Admitting: Radiation Oncology

## 2020-02-25 ENCOUNTER — Other Ambulatory Visit: Payer: Self-pay

## 2020-02-25 DIAGNOSIS — C61 Malignant neoplasm of prostate: Secondary | ICD-10-CM | POA: Diagnosis not present

## 2020-02-26 ENCOUNTER — Other Ambulatory Visit: Payer: Self-pay

## 2020-02-26 ENCOUNTER — Ambulatory Visit
Admission: RE | Admit: 2020-02-26 | Discharge: 2020-02-26 | Disposition: A | Discharge status: Home / Self Care | Payer: BC Managed Care – PPO | Source: Ambulatory Visit | Attending: Radiation Oncology | Admitting: Radiation Oncology

## 2020-02-26 DIAGNOSIS — C61 Malignant neoplasm of prostate: Secondary | ICD-10-CM | POA: Diagnosis not present

## 2020-02-27 ENCOUNTER — Ambulatory Visit
Admission: RE | Admit: 2020-02-27 | Discharge: 2020-02-27 | Disposition: A | Discharge status: Home / Self Care | Payer: BC Managed Care – PPO | Source: Ambulatory Visit | Attending: Radiation Oncology | Admitting: Radiation Oncology

## 2020-02-27 ENCOUNTER — Other Ambulatory Visit: Payer: Self-pay

## 2020-02-27 DIAGNOSIS — C61 Malignant neoplasm of prostate: Secondary | ICD-10-CM | POA: Diagnosis not present

## 2020-02-28 ENCOUNTER — Other Ambulatory Visit: Payer: Self-pay

## 2020-02-28 ENCOUNTER — Ambulatory Visit
Admission: RE | Admit: 2020-02-28 | Discharge: 2020-02-28 | Disposition: A | Discharge status: Home / Self Care | Payer: BC Managed Care – PPO | Source: Ambulatory Visit | Attending: Radiation Oncology | Admitting: Radiation Oncology

## 2020-02-28 DIAGNOSIS — C61 Malignant neoplasm of prostate: Secondary | ICD-10-CM | POA: Diagnosis not present

## 2020-02-28 NOTE — Progress Notes (Signed)
FMLA paperwork signed by Dr. Tammi Klippel on 02/28/2020. Two copies made; one left with Sam Presnell-RN and Melanie Rodgers-AD. Original copy placed in labeled envelope and given to therapists on L1 to return to patient tomorrow after his radiation treatment.

## 2020-02-29 ENCOUNTER — Ambulatory Visit
Admission: RE | Admit: 2020-02-29 | Discharge: 2020-02-29 | Disposition: A | Discharge status: Home / Self Care | Payer: BC Managed Care – PPO | Source: Ambulatory Visit | Attending: Radiation Oncology | Admitting: Radiation Oncology

## 2020-02-29 ENCOUNTER — Other Ambulatory Visit: Payer: Self-pay

## 2020-02-29 DIAGNOSIS — C61 Malignant neoplasm of prostate: Secondary | ICD-10-CM | POA: Diagnosis not present

## 2020-03-03 ENCOUNTER — Ambulatory Visit
Admission: RE | Admit: 2020-03-03 | Discharge: 2020-03-03 | Disposition: A | Discharge status: Home / Self Care | Payer: BC Managed Care – PPO | Source: Ambulatory Visit | Attending: Radiation Oncology | Admitting: Radiation Oncology

## 2020-03-03 ENCOUNTER — Other Ambulatory Visit: Payer: Self-pay

## 2020-03-03 DIAGNOSIS — C61 Malignant neoplasm of prostate: Secondary | ICD-10-CM | POA: Diagnosis not present

## 2020-03-04 ENCOUNTER — Ambulatory Visit
Admission: RE | Admit: 2020-03-04 | Discharge: 2020-03-04 | Disposition: A | Discharge status: Home / Self Care | Payer: BC Managed Care – PPO | Source: Ambulatory Visit | Attending: Radiation Oncology | Admitting: Radiation Oncology

## 2020-03-04 ENCOUNTER — Other Ambulatory Visit: Payer: Self-pay

## 2020-03-04 DIAGNOSIS — C61 Malignant neoplasm of prostate: Secondary | ICD-10-CM | POA: Diagnosis not present

## 2020-03-05 ENCOUNTER — Ambulatory Visit
Admission: RE | Admit: 2020-03-05 | Discharge: 2020-03-05 | Disposition: A | Discharge status: Home / Self Care | Payer: BC Managed Care – PPO | Source: Ambulatory Visit | Attending: Radiation Oncology | Admitting: Radiation Oncology

## 2020-03-05 ENCOUNTER — Other Ambulatory Visit: Payer: Self-pay

## 2020-03-05 DIAGNOSIS — C61 Malignant neoplasm of prostate: Secondary | ICD-10-CM | POA: Diagnosis not present

## 2020-03-06 ENCOUNTER — Ambulatory Visit
Admission: RE | Admit: 2020-03-06 | Discharge: 2020-03-06 | Disposition: A | Discharge status: Home / Self Care | Payer: BC Managed Care – PPO | Source: Ambulatory Visit | Attending: Radiation Oncology | Admitting: Radiation Oncology

## 2020-03-06 ENCOUNTER — Other Ambulatory Visit: Payer: Self-pay

## 2020-03-06 DIAGNOSIS — C61 Malignant neoplasm of prostate: Secondary | ICD-10-CM | POA: Diagnosis not present

## 2020-03-07 ENCOUNTER — Ambulatory Visit
Admission: RE | Admit: 2020-03-07 | Discharge: 2020-03-07 | Disposition: A | Discharge status: Home / Self Care | Payer: BC Managed Care – PPO | Source: Ambulatory Visit | Attending: Radiation Oncology | Admitting: Radiation Oncology

## 2020-03-07 ENCOUNTER — Other Ambulatory Visit: Payer: Self-pay

## 2020-03-07 DIAGNOSIS — C61 Malignant neoplasm of prostate: Secondary | ICD-10-CM | POA: Diagnosis not present

## 2020-03-11 ENCOUNTER — Other Ambulatory Visit: Payer: Self-pay

## 2020-03-11 ENCOUNTER — Ambulatory Visit
Admission: RE | Admit: 2020-03-11 | Discharge: 2020-03-11 | Disposition: A | Discharge status: Home / Self Care | Payer: BC Managed Care – PPO | Source: Ambulatory Visit | Attending: Radiation Oncology | Admitting: Radiation Oncology

## 2020-03-11 DIAGNOSIS — C61 Malignant neoplasm of prostate: Secondary | ICD-10-CM | POA: Insufficient documentation

## 2020-03-12 ENCOUNTER — Ambulatory Visit
Admission: RE | Admit: 2020-03-12 | Discharge: 2020-03-12 | Disposition: A | Discharge status: Home / Self Care | Payer: BC Managed Care – PPO | Source: Ambulatory Visit | Attending: Radiation Oncology | Admitting: Radiation Oncology

## 2020-03-12 ENCOUNTER — Other Ambulatory Visit: Payer: Self-pay

## 2020-03-12 DIAGNOSIS — C61 Malignant neoplasm of prostate: Secondary | ICD-10-CM | POA: Diagnosis not present

## 2020-03-13 ENCOUNTER — Ambulatory Visit
Admission: RE | Admit: 2020-03-13 | Discharge: 2020-03-13 | Disposition: A | Discharge status: Home / Self Care | Payer: BC Managed Care – PPO | Source: Ambulatory Visit | Attending: Radiation Oncology | Admitting: Radiation Oncology

## 2020-03-13 ENCOUNTER — Other Ambulatory Visit: Payer: Self-pay

## 2020-03-13 DIAGNOSIS — C61 Malignant neoplasm of prostate: Secondary | ICD-10-CM | POA: Diagnosis not present

## 2020-03-14 ENCOUNTER — Ambulatory Visit
Admission: RE | Admit: 2020-03-14 | Discharge: 2020-03-14 | Disposition: A | Discharge status: Home / Self Care | Payer: BC Managed Care – PPO | Source: Ambulatory Visit | Attending: Radiation Oncology | Admitting: Radiation Oncology

## 2020-03-14 ENCOUNTER — Other Ambulatory Visit: Payer: Self-pay

## 2020-03-14 ENCOUNTER — Other Ambulatory Visit: Payer: Self-pay | Admitting: Radiation Oncology

## 2020-03-14 DIAGNOSIS — C61 Malignant neoplasm of prostate: Secondary | ICD-10-CM | POA: Diagnosis not present

## 2020-03-17 ENCOUNTER — Other Ambulatory Visit: Payer: Self-pay

## 2020-03-17 ENCOUNTER — Ambulatory Visit
Admission: RE | Admit: 2020-03-17 | Discharge: 2020-03-17 | Disposition: A | Discharge status: Home / Self Care | Payer: BC Managed Care – PPO | Source: Ambulatory Visit | Attending: Radiation Oncology | Admitting: Radiation Oncology

## 2020-03-17 DIAGNOSIS — C61 Malignant neoplasm of prostate: Secondary | ICD-10-CM | POA: Diagnosis not present

## 2020-03-18 ENCOUNTER — Ambulatory Visit
Admission: RE | Admit: 2020-03-18 | Discharge: 2020-03-18 | Disposition: A | Discharge status: Home / Self Care | Payer: BC Managed Care – PPO | Source: Ambulatory Visit | Attending: Radiation Oncology | Admitting: Radiation Oncology

## 2020-03-18 ENCOUNTER — Other Ambulatory Visit: Payer: Self-pay

## 2020-03-18 DIAGNOSIS — C61 Malignant neoplasm of prostate: Secondary | ICD-10-CM | POA: Diagnosis not present

## 2020-03-19 ENCOUNTER — Other Ambulatory Visit: Payer: Self-pay

## 2020-03-19 ENCOUNTER — Ambulatory Visit
Admission: RE | Admit: 2020-03-19 | Discharge: 2020-03-19 | Disposition: A | Discharge status: Home / Self Care | Payer: BC Managed Care – PPO | Source: Ambulatory Visit | Attending: Radiation Oncology | Admitting: Radiation Oncology

## 2020-03-19 DIAGNOSIS — C61 Malignant neoplasm of prostate: Secondary | ICD-10-CM | POA: Diagnosis not present

## 2020-03-20 ENCOUNTER — Other Ambulatory Visit: Payer: Self-pay

## 2020-03-20 ENCOUNTER — Ambulatory Visit
Admission: RE | Admit: 2020-03-20 | Discharge: 2020-03-20 | Disposition: A | Discharge status: Home / Self Care | Payer: BC Managed Care – PPO | Source: Ambulatory Visit | Attending: Radiation Oncology | Admitting: Radiation Oncology

## 2020-03-20 DIAGNOSIS — C61 Malignant neoplasm of prostate: Secondary | ICD-10-CM | POA: Diagnosis not present

## 2020-03-21 ENCOUNTER — Other Ambulatory Visit: Payer: Self-pay | Admitting: Radiation Oncology

## 2020-03-21 ENCOUNTER — Other Ambulatory Visit: Payer: Self-pay

## 2020-03-21 ENCOUNTER — Ambulatory Visit
Admission: RE | Admit: 2020-03-21 | Discharge: 2020-03-21 | Disposition: A | Discharge status: Home / Self Care | Payer: BC Managed Care – PPO | Source: Ambulatory Visit | Attending: Radiation Oncology | Admitting: Radiation Oncology

## 2020-03-21 DIAGNOSIS — C61 Malignant neoplasm of prostate: Secondary | ICD-10-CM | POA: Diagnosis not present

## 2020-03-21 MED ORDER — ALFUZOSIN HCL ER 10 MG PO TB24
10.0000 mg | ORAL_TABLET | Freq: Every day | ORAL | 5 refills | Status: DC
Start: 2020-03-21 — End: 2020-07-08

## 2020-03-24 ENCOUNTER — Ambulatory Visit
Admission: RE | Admit: 2020-03-24 | Discharge: 2020-03-24 | Disposition: A | Discharge status: Home / Self Care | Payer: BC Managed Care – PPO | Source: Ambulatory Visit | Attending: Radiation Oncology | Admitting: Radiation Oncology

## 2020-03-24 ENCOUNTER — Other Ambulatory Visit: Payer: Self-pay

## 2020-03-24 DIAGNOSIS — C61 Malignant neoplasm of prostate: Secondary | ICD-10-CM | POA: Diagnosis not present

## 2020-03-25 ENCOUNTER — Ambulatory Visit
Admission: RE | Admit: 2020-03-25 | Discharge: 2020-03-25 | Disposition: A | Discharge status: Home / Self Care | Payer: BC Managed Care – PPO | Source: Ambulatory Visit | Attending: Radiation Oncology | Admitting: Radiation Oncology

## 2020-03-25 ENCOUNTER — Other Ambulatory Visit: Payer: Self-pay

## 2020-03-25 DIAGNOSIS — C61 Malignant neoplasm of prostate: Secondary | ICD-10-CM | POA: Diagnosis not present

## 2020-03-26 ENCOUNTER — Ambulatory Visit
Admission: RE | Admit: 2020-03-26 | Discharge: 2020-03-26 | Disposition: A | Discharge status: Home / Self Care | Payer: BC Managed Care – PPO | Source: Ambulatory Visit | Attending: Radiation Oncology | Admitting: Radiation Oncology

## 2020-03-26 ENCOUNTER — Other Ambulatory Visit: Payer: Self-pay

## 2020-03-26 DIAGNOSIS — C61 Malignant neoplasm of prostate: Secondary | ICD-10-CM | POA: Diagnosis not present

## 2020-03-27 ENCOUNTER — Other Ambulatory Visit: Payer: Self-pay

## 2020-03-27 ENCOUNTER — Ambulatory Visit
Admission: RE | Admit: 2020-03-27 | Discharge: 2020-03-27 | Disposition: A | Discharge status: Home / Self Care | Payer: BC Managed Care – PPO | Source: Ambulatory Visit | Attending: Radiation Oncology | Admitting: Radiation Oncology

## 2020-03-27 DIAGNOSIS — C61 Malignant neoplasm of prostate: Secondary | ICD-10-CM | POA: Diagnosis not present

## 2020-03-28 ENCOUNTER — Other Ambulatory Visit: Payer: Self-pay

## 2020-03-28 ENCOUNTER — Ambulatory Visit
Admission: RE | Admit: 2020-03-28 | Discharge: 2020-03-28 | Disposition: A | Discharge status: Home / Self Care | Payer: BC Managed Care – PPO | Source: Ambulatory Visit | Attending: Radiation Oncology | Admitting: Radiation Oncology

## 2020-03-28 ENCOUNTER — Encounter: Payer: Self-pay | Admitting: Radiation Oncology

## 2020-03-28 DIAGNOSIS — C61 Malignant neoplasm of prostate: Secondary | ICD-10-CM | POA: Diagnosis not present

## 2020-05-01 ENCOUNTER — Ambulatory Visit
Admission: RE | Admit: 2020-05-01 | Discharge: 2020-05-01 | Disposition: A | Discharge status: Home / Self Care | Payer: BC Managed Care – PPO | Source: Ambulatory Visit | Attending: Urology | Admitting: Urology

## 2020-05-01 ENCOUNTER — Other Ambulatory Visit: Payer: Self-pay

## 2020-05-01 DIAGNOSIS — C61 Malignant neoplasm of prostate: Secondary | ICD-10-CM

## 2020-05-01 NOTE — Progress Notes (Signed)
Radiation Oncology         [phone] ________________________________  Name: Juan Lewis MRN: [id number]  Date: 05/01/2020  DOB: [birth year]  Post Treatment Note  CC: Default, Provider, MD  Juan Mallow, MD  Diagnosis:   49 y.o. gentleman with Stage T1c adenocarcinoma of the prostate with Gleason score of 4+3, and PSA of 16.8.  Interval Since Last Radiation:  5 weeks  02/19/20 - 03/28/20: The prostate was treated 70 Gy in 28 fractions of 2.5 Gy each.  Narrative:  I spoke with the patient to conduct his routine scheduled 1 month follow up visit via telephone to spare the patient unnecessary potential exposure in the healthcare setting during the current COVID-19 pandemic.  The patient was notified in advance and gave permission to proceed with this visit format. He tolerated his radiation treatments relatively well with some increased frequency, urgency, weak stream, straining to void, hesitancy, intermittency and feelings of incomplete bladder emptying.  He was prescribed Uroxatrol 10 mg daily to help manage his LUTS.  He denied any diarrhea or fatigue.                            On review of systems, the patient states that he is doing very well in general. He reports gradual improvement in his LUTS and feels that he is almost back to his baseline at this point. He continues to take the Uroxatrol daily as prescribed. He denies gross hematuria, dysuria, incomplete bladder emptying or incontinence. He reports a healthy appetite and is maintaining his weight. He denies abdominal pain, nausea, vomiting, diarrhea or constipation. He denies any significant fatigue and has continued to remain active. Overall, he is quite pleased with his progress to date.  ALLERGIES:  has No Known Allergies.  Meds: Current Outpatient Medications  Medication Sig Dispense Refill   alfuzosin (UROXATRAL) 10 MG 24 hr tablet Take 1 tablet (10 mg total) by mouth daily with breakfast. 30 tablet 5   No current  facility-administered medications for this encounter.    Physical Findings:  vitals were not taken for this visit.   /Unable to assess due to telephone follow-up visit format.  Lab Findings: Lab Results  Component Value Date   WBC 4.0 12/05/2017   HGB 13.5 12/05/2017   HCT 41.5 12/05/2017   MCV 88.7 12/05/2017   PLT 239 12/05/2017     Radiographic Findings: No results found.  Impression/Plan: 1. 49 y.o. gentleman with Stage T1c adenocarcinoma of the prostate with Gleason score of 4+3, and PSA of 16.8. He will continue to follow up with urology for ongoing PSA determinations and has an appointment scheduled with Dr. Claudia Lewis on 05/08/2020. He had lab work drawn this morning. He understands what to expect with regards to PSA monitoring going forward. I will look forward to following his response to treatment via correspondence with urology, and would be happy to continue to participate in his care if clinically indicated. I talked to the patient about what to expect in the future, including his risk for erectile dysfunction and rectal bleeding. I encouraged him to call or return to the office if he has any questions regarding his previous radiation or possible radiation side effects. He was comfortable with this plan and will follow up as needed.  Today, a comprehensive survivorship care plan and treatment summary was reviewed with the patient today detailing his prostate cancer diagnosis, treatment course, potential late/long-term effects of treatment, appropriate  follow-up care with recommendations for the future, and patient education resources.  A copy of this summary, along with a letter will be sent to the patients Urologist, Dr. Claudia Lewis, via [facility] message, after todays visit.  2. Cancer screening:  Due to 49 history and his age, he should receive screening for skin cancers, colon cancer, and lung cancer.  The information and recommendations are listed on the patient's  comprehensive care plan/treatment summary and were reviewed in detail with the patient.     3. Health maintenance and wellness promotion: Juan Lewis was encouraged to consume 5-7 servings of fruits and vegetables per day. He was provided a copy of the "Nutrition Rainbow" handout, as well as the handout "Take Control of Your Health and [place]" from the [facility].  He was also encouraged to engage in moderate to vigorous exercise for 30 minutes per day most days of the week. Information was provided regarding the [facility] fitness program, which is designed for cancer survivors to help them become more physically fit after cancer treatments. We discussed that a healthy BMI is 18.5-24.9 and that maintaining a healthy weight reduces risk of cancer recurrences.  He was instructed to limit his alcohol consumption and continue to abstain from tobacco use.  Lastly, he was encouraged to use sunscreen and wear protective clothing when in the sun.     4. Support services/counseling: It is not uncommon for this period of the patient's cancer care trajectory to be one of many emotions and stressors.  Juan Lewis was encouraged to take advantage of our many support services programs, support groups, and/or counseling in coping with his new life as a cancer survivor after completing anti-cancer treatment.  He was offered support today through active listening and expressive supportive counseling.  He was given information regarding our available services and encouraged to contact me with any questions or for help enrolling in any of our support group/programs.       Juan Johns, PA-C

## 2020-05-06 ENCOUNTER — Encounter: Payer: Self-pay | Admitting: Medical Oncology

## 2020-05-13 ENCOUNTER — Telehealth: Payer: Self-pay | Admitting: Radiation Oncology

## 2020-05-13 NOTE — Telephone Encounter (Signed)
Completed PRESCRIBER RESPONSE FORM for case number [id number] and faxed back to [phone]. This RN explained this patient is no longer under the care of Dr. Tammi Klippel and correspondence should be deferred to patient's urologist, Dr. Link Snuffer. Fax confirmation of delivery obtained.

## 2020-05-19 ENCOUNTER — Ambulatory Visit (INDEPENDENT_AMBULATORY_CARE_PROVIDER_SITE_OTHER): Payer: BC Managed Care – PPO | Admitting: Otolaryngology

## 2020-06-17 ENCOUNTER — Telehealth: Payer: Self-pay | Admitting: *Deleted

## 2020-06-17 NOTE — Telephone Encounter (Signed)
CALLED PATIENT TO ASK ABOUT COMING TOMORROW FOR FU, PATIENT AGREED TO COME @ 11:40 AM

## 2020-06-18 ENCOUNTER — Telehealth: Payer: Self-pay | Admitting: Radiation Oncology

## 2020-06-18 ENCOUNTER — Other Ambulatory Visit: Payer: Self-pay

## 2020-06-18 ENCOUNTER — Ambulatory Visit
Admission: RE | Admit: 2020-06-18 | Discharge: 2020-06-18 | Disposition: A | Discharge status: Home / Self Care | Payer: PRIVATE HEALTH INSURANCE | Source: Ambulatory Visit | Attending: Radiation Oncology | Admitting: Radiation Oncology

## 2020-06-18 VITALS — BP 121/86 | HR 59 | Temp 98.4°F | Resp 20 | Ht 68.0 in | Wt 162.0 lb

## 2020-06-18 DIAGNOSIS — R635 Abnormal weight gain: Secondary | ICD-10-CM | POA: Insufficient documentation

## 2020-06-18 DIAGNOSIS — C320 Malignant neoplasm of glottis: Secondary | ICD-10-CM | POA: Diagnosis not present

## 2020-06-18 DIAGNOSIS — C61 Malignant neoplasm of prostate: Secondary | ICD-10-CM | POA: Diagnosis present

## 2020-06-18 LAB — TSH: TSH: 2.206 u[IU]/mL (ref 0.320–4.118)

## 2020-06-18 MED ORDER — LARYNGOSCOPY SOLUTION RAD-ONC
15.0000 mL | Freq: Once | TOPICAL | Status: AC
  Administered 2020-06-18: 15 mL via TOPICAL
  Filled 2020-06-18: qty 15

## 2020-06-18 NOTE — Telephone Encounter (Signed)
Phoned patient explained his TSH is normal/"good." Explained he does not need any new medication since his results were normal. Patient verbalized understanding of all reviewed and appreciation for the call.

## 2020-06-18 NOTE — Progress Notes (Signed)
Mr. Flegel presents for follow up of radiation completed 06/01/2019 to his head and neck  Pain issues, if any: Patient denies Using a feeding tube?: N/A Weight changes, if any:  Wt Readings from Last 3 Encounters:  06/18/20 162 lb (73.5 kg)  12/04/19 164 lb (74.4 kg)  06/15/19 152 lb 9.6 oz (69.2 kg)   Swallowing issues, if any: Patient denies. He reports he and eat solids and drink thin liquids without difficulty Smoking or chewing tobacco? None Using fluoride trays daily? N/A Last ENT visit was on: 01/24/2020 Saw Dr. Melony Overly: "Fiberoptic laryngoscopy through the left nostril revealed a clear nasopharynx.  Base of tongue vallecula epiglottis were normal.  Vocal cords were clear bilaterally.  Anterior commissure was clear.  Both cords had normal mobility. Neck: No palpable adenopathy or masses.  No palpable adenopathy on either side of the neck. No evidence of neoplasia or abnormality. Plan: Reassured him of normal laryngeal examination in the office today. He is scheduled to see Dr. Isidore Moos in September. He will follow up here in 7 to 8 months."  Other notable issues, if any:  Treated for prostate cancer (02/19/2020-03/28/2020) by Dr. Tyler Pita: 70Gy in 29fractions of 2.5 Gy each  Vitals:   06/18/20 1143  BP: 121/86  Pulse: (!) 59  Resp: 20  Temp: 98.4 F (36.9 C)  SpO2: 98%

## 2020-06-20 ENCOUNTER — Ambulatory Visit: Payer: Self-pay | Admitting: Radiation Oncology

## 2020-06-20 ENCOUNTER — Encounter: Payer: Self-pay | Admitting: Radiation Oncology

## 2020-06-20 NOTE — Progress Notes (Signed)
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 Weight gain  R63.5 TSH    Radiation Treatment Dates: 04/23/2019 through 05/31/2019 Site Technique Total Dose Dose per Fx Completed Fx Beam Energies  Head & neck: HN_larynx 3D 65.25/65.25 2.25 29/29 6X    CHIEF COMPLAINT:  Here for follow-up and surveillance of glottic cancer  Narrative:    Mr. Korff presents for follow up of radiation completed 06/01/2019 to his head and neck  Pain issues, if any: Patient denies Using a feeding tube?: N/A Weight changes, if any:  Wt Readings from Last 3 Encounters:  06/18/20 162 lb (73.5 kg)  12/04/19 164 lb (74.4 kg)  06/15/19 152 lb 9.6 oz (69.2 kg)   Swallowing issues, if any: Patient denies. He reports he and eat solids and drink thin liquids without difficulty Smoking or chewing tobacco? None Using fluoride trays daily? N/A Last ENT visit was on: 01/24/2020 Saw Dr. Melony Overly: "Fiberoptic laryngoscopy through the left nostril revealed a clear nasopharynx.  Base of tongue vallecula epiglottis were normal.  Vocal cords were clear bilaterally.  Anterior commissure was clear.  Both cords had normal mobility. Neck: No palpable adenopathy or masses.  No palpable adenopathy on either side of the neck. No evidence of neoplasia or abnormality. Plan: Reassured him of normal laryngeal examination in the office today. He is scheduled to see Dr. Isidore Moos in September. He will follow up here in 7 to 8 months."  Other notable issues, if any:  Treated for prostate cancer (02/19/2020-03/28/2020) by Dr. Tyler Pita: 70Gy in 34fractions of 2.5  Gy each    ALLERGIES:  has No Known Allergies.  Meds: Current Outpatient Medications  Medication Sig Dispense Refill  . alfuzosin (UROXATRAL) 10 MG 24 hr tablet Take 1 tablet (10 mg total) by mouth daily with breakfast. (Patient not taking: Reported on 06/18/2020) 30 tablet 5  . diazepam (VALIUM) 10 MG tablet Take 20 mg by mouth as directed. (Patient not taking: Reported on 06/18/2020)    . sildenafil (VIAGRA) 100 MG tablet Take 100 mg by mouth daily. (Patient not taking: Reported on 06/18/2020)    . tamsulosin (FLOMAX) 0.4 MG CAPS capsule Take 0.4 mg by mouth daily. (Patient not taking: Reported on 06/18/2020)     No current facility-administered medications for this encounter.    Physical Findings: The patient is in no acute distress. Patient is alert and oriented. Wt Readings from Last 3 Encounters:  06/18/20 162 lb (73.5 kg)  12/04/19 164 lb (74.4 kg)  06/15/19 152 lb 9.6 oz (69.2 kg)    height is 5\' 8"  (1.727 m) and weight is 162 lb (73.5 kg). His temperature is 98.4 F (36.9 C). His blood pressure is 121/86 and his pulse is 59 (abnormal). His respiration is 20 and oxygen saturation is 98%. .  General: Alert and oriented, in no acute distress HEENT: Head is normocephalic.  Oral cavity and oropharynx  notable for no lesions Neck: No palpable adenopathy Chest clear to auscultation bilaterally Heart regular in rate and rhythm Skin: Satisfactory healing of skin of neck  Psychiatric: Judgment and  insight are intact. Affect is appropriate.  PROCEDURE NOTE: After obtaining consent and anesthetizing the nasal cavity with topical lidocaine and phenylephrine, the flexible endoscope was introduced and passed through the nasal cavity.  The pharynx is clear.  Supraglottis is clear.  Glottic larynx is notable for a superficial lesion that spans the anterior commissure.  Photo below.  Patient tolerated the procedure well      Lab Findings: Lab Results  Component Value Date   WBC 4.0  12/05/2017   HGB 13.5 12/05/2017   HCT 41.5 12/05/2017   MCV 88.7 12/05/2017   PLT 239 12/05/2017    Lab Results  Component Value Date   TSH 2.206 06/18/2020    Radiographic Findings: No results found.  Impression/Plan:    1) Head and Neck Cancer Status: I contacted Dr. Lucia Gaskins and showed him the photo from the patient's laryngoscopy.  Out of caution, Dr. Lucia Gaskins would like to see the patient within the next month to inspect this further.  His staff will contact the patient to arrange and I will ask our navigator to ensure that this appointment is moved up.  2) Nutritional Status: No issues  3) Risk Factors: The patient has been educated about risk factors including alcohol and tobacco abuse; they understand that avoidance of alcohol and tobacco is important to prevent recurrences as well as other cancers - not smoking.  4) Swallowing: No issues  5)  Thyroid function:  Lab Results  Component Value Date   TSH 2.206 06/18/2020  Within normal limits  6) patient will follow up with Dr. Lucia Gaskins as above, in the next month.  I will see him back in April.  On date of service, in total, I spent 30 minutes on this encounter. Patient was seen in person.  _____________________________________   Eppie Gibson, MD

## 2020-07-04 ENCOUNTER — Encounter (INDEPENDENT_AMBULATORY_CARE_PROVIDER_SITE_OTHER): Payer: Self-pay

## 2020-07-04 ENCOUNTER — Ambulatory Visit (INDEPENDENT_AMBULATORY_CARE_PROVIDER_SITE_OTHER): Payer: PRIVATE HEALTH INSURANCE | Admitting: Otolaryngology

## 2020-07-04 ENCOUNTER — Other Ambulatory Visit: Payer: Self-pay

## 2020-07-04 VITALS — Temp 97.7°F

## 2020-07-04 DIAGNOSIS — Z8521 Personal history of malignant neoplasm of larynx: Secondary | ICD-10-CM

## 2020-07-04 DIAGNOSIS — J383 Other diseases of vocal cords: Secondary | ICD-10-CM

## 2020-07-04 NOTE — Progress Notes (Signed)
HPI: Juan Lewis is a 49 y.o. male who returns today for evaluation of vocal cords.  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  He completed his radiation treatment 03/28/2020.  He recently had endoscopy by Dr. 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  He has had no significant change in his voice.  Past Medical History:  Diagnosis Date  . Hoarseness   . Lesion of vocal cord   . Prostate cancer [facility])    Past Surgical History:  Procedure Laterality Date  . MICROLARYNGOSCOPY N/A 03/23/2019   Procedure: MICROLARYNGOSCOPY WITH EXCISION OF VOCAL CORD LESION;  Surgeon: Rozetta Nunnery, MD;  Location: [facility];  Service: ENT;  Laterality: N/A;  . PROSTATE BIOPSY     Social History   Socioeconomic History  . Marital status: Married    Spouse name: [name]   . Number of children: 4  . Years of education: Not on file  . Highest education level: Not on file  Occupational History  . Not on file  Tobacco Use  . Smoking status: Former Smoker    Packs/day: 0.50    Years: 30.00    Pack years: 15.00    Start date: 9    Quit date: 04/20/2019    Years since quitting: 1.2  . Smokeless tobacco: Never Used  . Tobacco comment: he quit when he started radiation. 06/15/19  Vaping Use  . Vaping Use: Never used  Substance and Sexual Activity  . Alcohol use: Not Currently    Comment: social  . Drug use: No  . Sexual activity: Not Currently  Other Topics Concern  . Not on file  Social History Narrative  . Not on file   Social Determinants of Health   Financial Resource Strain:   . Difficulty of Paying Living Expenses: Not on file  Food Insecurity:   . Worried About Charity fundraiser in the Last Year: Not on file  . Ran Out of Food in the Last Year: Not on file   Transportation Needs:   . Lack of Transportation (Medical): Not on file  . Lack of Transportation (Non-Medical): Not on file  Physical Activity:   . Days of Exercise per Week: Not on file  . Minutes of Exercise per Session: Not on file  Stress:   . Feeling of Stress : Not on file  Social Connections:   . Frequency of Communication with Friends and Family: Not on file  . Frequency of Social Gatherings with Friends and Family: Not on file  . Attends Religious Services: Not on file  . Active Member of Clubs or Organizations: Not on file  . Attends Archivist Meetings: Not on file  . Marital Status: Not on file   Family History  Problem Relation Age of Onset  . Prostate cancer Neg Hx   . Breast cancer Neg Hx   . Colon cancer Neg Hx   . Pancreatic cancer Neg Hx    No Known Allergies Prior to Admission medications   Medication Sig Start Date End Date Taking? Authorizing Provider  alfuzosin (UROXATRAL) 10 MG 24 hr tablet Take 1 tablet (10 mg total) by mouth daily with breakfast. 03/21/20  Yes Tyler Pita, MD  diazepam (VALIUM) 10 MG tablet Take 20 mg by mouth as directed.  01/24/20  Yes [provider]  sildenafil (VIAGRA) 100 MG tablet Take 100 mg by mouth daily.  05/08/20  Yes [provider]  tamsulosin (FLOMAX) 0.4 MG CAPS capsule Take 0.4 mg by mouth daily.  06/03/20  Yes [provider]     Positive ROS: Otherwise negative  All other systems have been reviewed and were otherwise negative with the exception of those mentioned in the HPI and as above.  Physical Exam: Constitutional: Alert, well-appearing, no acute distress Ears: External ears without lesions or tenderness. Ear canals are clear bilaterally with intact, clear TMs.  Nasal: External nose without lesions. Septum midline with moderate rhinitis..  Edematous nasal mucosa with clear mucus discharge. Oral: Lips and gums without lesions. Tongue and palate mucosa without lesions.  Posterior oropharynx clear. Fiberoptic laryngoscopy was performed through the right nostril.  The nasopharynx was clear.  The base of tongue vallecula and epiglottis were normal.  On evaluation of vocal cords she has some mild edema anterior commissure appeared reasonably clear there was no obvious bulky lesion although he had some slight scar tissue anteriorly on the right.  Both cords had symmetric mobility bilaterally. Neck: No palpable adenopathy or masses Respiratory: Breathing comfortably  Skin: No facial/neck lesions or rash noted.  Laryngoscopy  Date/Time: 07/04/2020 1:44 PM Performed by: Rozetta Nunnery, MD Authorized by: Rozetta Nunnery, MD   Consent:    Consent obtained:  Verbal   Consent given by:  Patient Procedure details:    Indications: oncologic surveillance follow-up     Medication:  Afrin   Instrument: flexible fiberoptic laryngoscope     Scope location: right nare   Sinus:    Right nasopharynx: normal   Mouth:    Oropharynx: normal     Vallecula: normal     Base of tongue: normal     Epiglottis: normal   Throat:    True vocal cords: normal   Comments:     Patient with perhaps mild leukoplakia or scar tissue the anterior right true vocal cord following completion of radiation therapy 1 year ago for right true vocal cord T2N0 cancer.    Assessment: On fiberoptic laryngoscopy in the office today vocal cords had normal mobility bilaterally and appeared reasonably clear although he has some leukoplakia or scar tissue anteriorly on the right true vocal cord.  Plan: Because of the photos sent to me by Dr. Isidore Moos and concern over possible persistent cancer I will schedule him for repeat microlaryngoscopy and biopsy in the next few weeks.  I discussed this with the patient and showed him the pictures of his previous cancer as well as the photos obtained by Dr. Isidore Moos and he is agreeable.   Radene Journey, MD

## 2020-07-04 NOTE — Progress Notes (Unsigned)
Work notew to be out for surgery.

## 2020-07-07 ENCOUNTER — Ambulatory Visit (INDEPENDENT_AMBULATORY_CARE_PROVIDER_SITE_OTHER): Payer: Self-pay | Admitting: Otolaryngology

## 2020-07-07 DIAGNOSIS — J383 Other diseases of vocal cords: Secondary | ICD-10-CM

## 2020-07-07 NOTE — H&P (Signed)
PREOPERATIVE H&P  Chief Complaint: History of vocal cord cancer with leukoplakia noted on recent fiberoptic laryngoscopy.  HPI: Juan Lewis is a 49 y.o. male who presents for evaluation of leukoplakia noted on recent fiberoptic laryngoscopy by Dr. Isidore Moos.  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  He recently underwent laryngoscopy with Dr. Isidore Moos who noted some persistent leukoplakia anteriorly on the right side.  I repeated fiberoptic laryngoscopy in the office and he has some mild edema and leukoplakic changes which may be only scar tissue versus persistent cancer.  He is taken to the operating room at this time for microlaryngoscopy and biopsy.  Past Medical History:  Diagnosis Date  . Hoarseness   . Lesion of vocal cord   . Prostate cancer [facility])    Past Surgical History:  Procedure Laterality Date  . MICROLARYNGOSCOPY N/A 03/23/2019   Procedure: MICROLARYNGOSCOPY WITH EXCISION OF VOCAL CORD LESION;  Surgeon: Rozetta Nunnery, MD;  Location: [facility];  Service: ENT;  Laterality: N/A;  . PROSTATE BIOPSY     Social History   Socioeconomic History  . Marital status: Married    Spouse name: [name]   . Number of children: 4  . Years of education: Not on file  . Highest education level: Not on file  Occupational History  . Not on file  Tobacco Use  . Smoking status: Former Smoker    Packs/day: 0.50    Years: 30.00    Pack years: 15.00    Start date: 54    Quit date: 04/20/2019    Years since quitting: 1.2  . Smokeless tobacco: Never Used  . Tobacco comment: he quit when he started radiation. 06/15/19  Vaping Use  . Vaping Use: Never used  Substance and Sexual Activity  . Alcohol use: Not Currently    Comment: social  . Drug use: No  . Sexual activity: Not Currently  Other Topics Concern  . Not on file  Social History Narrative  . Not on file   Social Determinants of  Health   Financial Resource Strain:   . Difficulty of Paying Living Expenses: Not on file  Food Insecurity:   . Worried About Charity fundraiser in the Last Year: Not on file  . Ran Out of Food in the Last Year: Not on file  Transportation Needs:   . Lack of Transportation (Medical): Not on file  . Lack of Transportation (Non-Medical): Not on file  Physical Activity:   . Days of Exercise per Week: Not on file  . Minutes of Exercise per Session: Not on file  Stress:   . Feeling of Stress : Not on file  Social Connections:   . Frequency of Communication with Friends and Family: Not on file  . Frequency of Social Gatherings with Friends and Family: Not on file  . Attends Religious Services: Not on file  . Active Member of Clubs or Organizations: Not on file  . Attends Archivist Meetings: Not on file  . Marital Status: Not on file   Family History  Problem Relation Age of Onset  . Prostate cancer Neg Hx   . Breast cancer Neg Hx   . Colon cancer Neg Hx   . Pancreatic cancer Neg Hx    No Known Allergies Prior to Admission medications   Medication Sig Start Date End Date Taking? Authorizing Provider  alfuzosin (UROXATRAL) 10 MG 24 hr  tablet Take 1 tablet (10 mg total) by mouth daily with breakfast. 03/21/20   Tyler Pita, MD  diazepam (VALIUM) 10 MG tablet Take 20 mg by mouth as directed.  01/24/20   [provider]  sildenafil (VIAGRA) 100 MG tablet Take 100 mg by mouth daily.  05/08/20   [provider]  tamsulosin (FLOMAX) 0.4 MG CAPS capsule Take 0.4 mg by mouth daily.  06/03/20   [provider]     Positive ROS: Otherwise negative  All other systems have been reviewed and were otherwise negative with the exception of those mentioned in the HPI and as above.  Physical Exam: There were no vitals filed for this visit.  General: Alert, no acute distress Oral: Normal oral mucosa and tonsils Nasal: Clear nasal passages Fiberoptic  laryngoscopy through the left nostril revealed moderate supraglottic edema as well as vocal cord edema and questionable scar tissue versus leukoplakia of anterior vocal cord. Neck: No palpable adenopathy or thyroid nodules Ear: Ear canal is clear with normal appearing TMs Cardiovascular: Regular rate and rhythm, no murmur.  Respiratory: Clear to auscultation Neurologic: Alert and oriented x 3   Assessment/Plan: Vocal cord leukoplakia  Plan for microlaryngoscopy and biopsy.   Melony Overly, MD 07/07/2020 5:40 PM

## 2020-07-07 NOTE — H&P (View-Only) (Signed)
PREOPERATIVE H&P  Chief Complaint: History of vocal cord cancer with leukoplakia noted on recent fiberoptic laryngoscopy.  HPI: Juan Lewis is a 49 y.o. male who presents for evaluation of leukoplakia noted on recent fiberoptic laryngoscopy by Dr. Isidore Moos.  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  He recently underwent laryngoscopy with Dr. Isidore Moos who noted some persistent leukoplakia anteriorly on the right side.  I repeated fiberoptic laryngoscopy in the office and he has some mild edema and leukoplakic changes which may be only scar tissue versus persistent cancer.  He is taken to the operating room at this time for microlaryngoscopy and biopsy.  Past Medical History:  Diagnosis Date  . Hoarseness   . Lesion of vocal cord   . Prostate cancer [facility])    Past Surgical History:  Procedure Laterality Date  . MICROLARYNGOSCOPY N/A 03/23/2019   Procedure: MICROLARYNGOSCOPY WITH EXCISION OF VOCAL CORD LESION;  Surgeon: Rozetta Nunnery, MD;  Location: [place];  Service: ENT;  Laterality: N/A;  . PROSTATE BIOPSY     Social History   Socioeconomic History  . Marital status: Married    Spouse name: [name]   . Number of children: 4  . Years of education: Not on file  . Highest education level: Not on file  Occupational History  . Not on file  Tobacco Use  . Smoking status: Former Smoker    Packs/day: 0.50    Years: 30.00    Pack years: 15.00    Start date: 3    Quit date: 04/20/2019    Years since quitting: 1.2  . Smokeless tobacco: Never Used  . Tobacco comment: he quit when he started radiation. 06/15/19  Vaping Use  . Vaping Use: Never used  Substance and Sexual Activity  . Alcohol use: Not Currently    Comment: social  . Drug use: No  . Sexual activity: Not Currently  Other Topics Concern  . Not on file  Social History Narrative  . Not on file   Social Determinants of  Health   Financial Resource Strain:   . Difficulty of Paying Living Expenses: Not on file  Food Insecurity:   . Worried About Charity fundraiser in the Last Year: Not on file  . Ran Out of Food in the Last Year: Not on file  Transportation Needs:   . Lack of Transportation (Medical): Not on file  . Lack of Transportation (Non-Medical): Not on file  Physical Activity:   . Days of Exercise per Week: Not on file  . Minutes of Exercise per Session: Not on file  Stress:   . Feeling of Stress : Not on file  Social Connections:   . Frequency of Communication with Friends and Family: Not on file  . Frequency of Social Gatherings with Friends and Family: Not on file  . Attends Religious Services: Not on file  . Active Member of Clubs or Organizations: Not on file  . Attends Archivist Meetings: Not on file  . Marital Status: Not on file   Family History  Problem Relation Age of Onset  . Prostate cancer Neg Hx   . Breast cancer Neg Hx   . Colon cancer Neg Hx   . Pancreatic cancer Neg Hx    No Known Allergies Prior to Admission medications   Medication Sig Start Date End Date Taking? Authorizing Provider  alfuzosin (UROXATRAL) 10 MG 24 hr  tablet Take 1 tablet (10 mg total) by mouth daily with breakfast. 03/21/20   Tyler Pita, MD  diazepam (VALIUM) 10 MG tablet Take 20 mg by mouth as directed.  01/24/20   [provider]  sildenafil (VIAGRA) 100 MG tablet Take 100 mg by mouth daily.  05/08/20   [provider]  tamsulosin (FLOMAX) 0.4 MG CAPS capsule Take 0.4 mg by mouth daily.  06/03/20   [provider]     Positive ROS: Otherwise negative  All other systems have been reviewed and were otherwise negative with the exception of those mentioned in the HPI and as above.  Physical Exam: There were no vitals filed for this visit.  General: Alert, no acute distress Oral: Normal oral mucosa and tonsils Nasal: Clear nasal passages Fiberoptic  laryngoscopy through the left nostril revealed moderate supraglottic edema as well as vocal cord edema and questionable scar tissue versus leukoplakia of anterior vocal cord. Neck: No palpable adenopathy or thyroid nodules Ear: Ear canal is clear with normal appearing TMs Cardiovascular: Regular rate and rhythm, no murmur.  Respiratory: Clear to auscultation Neurologic: Alert and oriented x 3   Assessment/Plan: Vocal cord leukoplakia  Plan for microlaryngoscopy and biopsy.   Melony Overly, MD 07/07/2020 5:40 PM

## 2020-07-08 ENCOUNTER — Encounter (HOSPITAL_BASED_OUTPATIENT_CLINIC_OR_DEPARTMENT_OTHER): Payer: Self-pay | Admitting: Otolaryngology

## 2020-07-08 ENCOUNTER — Other Ambulatory Visit: Payer: Self-pay

## 2020-07-11 ENCOUNTER — Other Ambulatory Visit (HOSPITAL_COMMUNITY)
Admission: RE | Admit: 2020-07-11 | Discharge: 2020-07-11 | Disposition: A | Discharge status: Home / Self Care | Payer: PRIVATE HEALTH INSURANCE | Source: Ambulatory Visit | Attending: Otolaryngology | Admitting: Otolaryngology

## 2020-07-11 DIAGNOSIS — Z01812 Encounter for preprocedural laboratory examination: Secondary | ICD-10-CM | POA: Diagnosis present

## 2020-07-11 DIAGNOSIS — Z20822 Contact with and (suspected) exposure to covid-19: Secondary | ICD-10-CM | POA: Diagnosis not present

## 2020-07-11 LAB — SARS CORONAVIRUS 2 (TAT 6-24 HRS): SARS Coronavirus 2: NEGATIVE

## 2020-07-15 ENCOUNTER — Ambulatory Visit (HOSPITAL_BASED_OUTPATIENT_CLINIC_OR_DEPARTMENT_OTHER)
Admission: RE | Admit: 2020-07-15 | Discharge: 2020-07-15 | Disposition: A | Discharge status: Home / Self Care | Payer: No Typology Code available for payment source | Attending: Otolaryngology | Admitting: Otolaryngology

## 2020-07-15 ENCOUNTER — Other Ambulatory Visit: Payer: Self-pay

## 2020-07-15 ENCOUNTER — Encounter (HOSPITAL_BASED_OUTPATIENT_CLINIC_OR_DEPARTMENT_OTHER)
Admission: RE | Disposition: A | Discharge status: Home / Self Care | Payer: Self-pay | Source: Home / Self Care | Attending: Otolaryngology

## 2020-07-15 ENCOUNTER — Encounter (HOSPITAL_BASED_OUTPATIENT_CLINIC_OR_DEPARTMENT_OTHER): Payer: Self-pay | Admitting: Otolaryngology

## 2020-07-15 ENCOUNTER — Ambulatory Visit (HOSPITAL_BASED_OUTPATIENT_CLINIC_OR_DEPARTMENT_OTHER): Payer: No Typology Code available for payment source | Admitting: Certified Registered"

## 2020-07-15 DIAGNOSIS — Z8546 Personal history of malignant neoplasm of prostate: Secondary | ICD-10-CM | POA: Insufficient documentation

## 2020-07-15 DIAGNOSIS — C320 Malignant neoplasm of glottis: Secondary | ICD-10-CM | POA: Insufficient documentation

## 2020-07-15 DIAGNOSIS — R490 Dysphonia: Secondary | ICD-10-CM | POA: Insufficient documentation

## 2020-07-15 DIAGNOSIS — Z87891 Personal history of nicotine dependence: Secondary | ICD-10-CM | POA: Insufficient documentation

## 2020-07-15 DIAGNOSIS — J383 Other diseases of vocal cords: Secondary | ICD-10-CM | POA: Diagnosis present

## 2020-07-15 DIAGNOSIS — Z8521 Personal history of malignant neoplasm of larynx: Secondary | ICD-10-CM | POA: Diagnosis not present

## 2020-07-15 DIAGNOSIS — Z923 Personal history of irradiation: Secondary | ICD-10-CM | POA: Diagnosis not present

## 2020-07-15 DIAGNOSIS — Z8616 Personal history of COVID-19: Secondary | ICD-10-CM | POA: Insufficient documentation

## 2020-07-15 HISTORY — PX: MICROLARYNGOSCOPY: SHX5208

## 2020-07-15 SURGERY — MICROLARYNGOSCOPY
Anesthesia: General | Site: Throat

## 2020-07-15 MED ORDER — CEFAZOLIN SODIUM-DEXTROSE 2-4 GM/100ML-% IV SOLN
INTRAVENOUS | Status: AC
  Filled 2020-07-15: qty 100

## 2020-07-15 MED ORDER — MIDAZOLAM HCL 2 MG/2ML IJ SOLN
INTRAMUSCULAR | Status: AC
  Filled 2020-07-15: qty 2

## 2020-07-15 MED ORDER — ROCURONIUM BROMIDE 10 MG/ML (PF) SYRINGE
PREFILLED_SYRINGE | INTRAVENOUS | Status: AC
  Filled 2020-07-15: qty 10

## 2020-07-15 MED ORDER — CHLORHEXIDINE GLUCONATE CLOTH 2 % EX PADS: 6.0000 | MEDICATED_PAD | Freq: Once | CUTANEOUS | Status: DC

## 2020-07-15 MED ORDER — AMISULPRIDE (ANTIEMETIC) 5 MG/2ML IV SOLN: 10.0000 mg | Freq: Once | INTRAVENOUS | Status: DC | PRN

## 2020-07-15 MED ORDER — ROCURONIUM BROMIDE 100 MG/10ML IV SOLN
INTRAVENOUS | Status: DC | PRN
  Administered 2020-07-15: 70 mg via INTRAVENOUS

## 2020-07-15 MED ORDER — FENTANYL CITRATE (PF) 100 MCG/2ML IJ SOLN
INTRAMUSCULAR | Status: DC | PRN
Start: 2020-07-15 — End: 2020-07-15
  Administered 2020-07-15: 100 ug via INTRAVENOUS

## 2020-07-15 MED ORDER — CEFAZOLIN SODIUM-DEXTROSE 2-4 GM/100ML-% IV SOLN
2.0000 g | INTRAVENOUS | Status: AC
  Administered 2020-07-15: 2 g via INTRAVENOUS

## 2020-07-15 MED ORDER — SUGAMMADEX SODIUM 200 MG/2ML IV SOLN
INTRAVENOUS | Status: DC | PRN
  Administered 2020-07-15: 200 mg via INTRAVENOUS

## 2020-07-15 MED ORDER — PROMETHAZINE HCL 25 MG/ML IJ SOLN: 6.2500 mg | INTRAMUSCULAR | Status: DC | PRN

## 2020-07-15 MED ORDER — EPINEPHRINE PF 1 MG/ML IJ SOLN
INTRAMUSCULAR | Status: DC | PRN
  Administered 2020-07-15: 1 mg

## 2020-07-15 MED ORDER — GLYCOPYRROLATE 0.2 MG/ML IJ SOLN
INTRAMUSCULAR | Status: DC | PRN
  Administered 2020-07-15: .2 mg via INTRAVENOUS

## 2020-07-15 MED ORDER — OXYCODONE HCL 5 MG/5ML PO SOLN: 5.0000 mg | Freq: Once | ORAL | Status: DC | PRN

## 2020-07-15 MED ORDER — PROPOFOL 10 MG/ML IV BOLUS
INTRAVENOUS | Status: DC | PRN
  Administered 2020-07-15: 150 mg via INTRAVENOUS

## 2020-07-15 MED ORDER — LACTATED RINGERS IV SOLN: INTRAVENOUS | Status: DC

## 2020-07-15 MED ORDER — PROPOFOL 10 MG/ML IV BOLUS
INTRAVENOUS | Status: AC
  Filled 2020-07-15: qty 40

## 2020-07-15 MED ORDER — OXYCODONE HCL 5 MG PO TABS: 5.0000 mg | ORAL_TABLET | Freq: Once | ORAL | Status: DC | PRN

## 2020-07-15 MED ORDER — ONDANSETRON HCL 4 MG/2ML IJ SOLN
INTRAMUSCULAR | Status: AC
  Filled 2020-07-15: qty 2

## 2020-07-15 MED ORDER — HYDROMORPHONE HCL 1 MG/ML IJ SOLN: 0.2500 mg | INTRAMUSCULAR | Status: DC | PRN

## 2020-07-15 MED ORDER — FENTANYL CITRATE (PF) 100 MCG/2ML IJ SOLN
INTRAMUSCULAR | Status: AC
  Filled 2020-07-15: qty 2

## 2020-07-15 MED ORDER — MIDAZOLAM HCL 5 MG/5ML IJ SOLN
INTRAMUSCULAR | Status: DC | PRN
  Administered 2020-07-15: 2 mg via INTRAVENOUS

## 2020-07-15 MED ORDER — MEPERIDINE HCL 25 MG/ML IJ SOLN: 6.2500 mg | INTRAMUSCULAR | Status: DC | PRN

## 2020-07-15 MED ORDER — EPINEPHRINE PF 1 MG/ML IJ SOLN
INTRAMUSCULAR | Status: AC
  Filled 2020-07-15: qty 2

## 2020-07-15 MED ORDER — LIDOCAINE 2% (20 MG/ML) 5 ML SYRINGE
INTRAMUSCULAR | Status: AC
  Filled 2020-07-15: qty 5

## 2020-07-15 MED ORDER — LIDOCAINE HCL (CARDIAC) PF 100 MG/5ML IV SOSY
PREFILLED_SYRINGE | INTRAVENOUS | Status: DC | PRN
  Administered 2020-07-15: 100 mg via INTRAVENOUS

## 2020-07-15 MED ORDER — DEXAMETHASONE SODIUM PHOSPHATE 10 MG/ML IJ SOLN
INTRAMUSCULAR | Status: AC
  Filled 2020-07-15: qty 1

## 2020-07-15 MED ORDER — ONDANSETRON HCL 4 MG/2ML IJ SOLN
INTRAMUSCULAR | Status: DC | PRN
  Administered 2020-07-15: 4 mg via INTRAVENOUS

## 2020-07-15 MED ORDER — DEXAMETHASONE SODIUM PHOSPHATE 4 MG/ML IJ SOLN
INTRAMUSCULAR | Status: DC | PRN
  Administered 2020-07-15: 10 mg via INTRAVENOUS

## 2020-07-15 SURGICAL SUPPLY — 32 items
CANISTER SUCT 1200ML W/VALVE (MISCELLANEOUS) ×3 IMPLANT
CNTNR URN SCR LID CUP LEK RST (MISCELLANEOUS) IMPLANT
CONT SPEC 4OZ STRL OR WHT (MISCELLANEOUS)
COVER WAND RF STERILE (DRAPES) IMPLANT
GAUZE SPONGE 4X4 12PLY STRL LF (GAUZE/BANDAGES/DRESSINGS) ×6 IMPLANT
GLOVE BIOGEL PI IND STRL 7.0 (GLOVE) ×1 IMPLANT
GLOVE BIOGEL PI INDICATOR 7.0 (GLOVE) ×2
GLOVE ECLIPSE 6.5 STRL STRAW (GLOVE) ×3 IMPLANT
GLOVE SS BIOGEL STRL SZ 7.5 (GLOVE) ×1 IMPLANT
GLOVE SUPERSENSE BIOGEL SZ 7.5 (GLOVE) ×2
GOWN STRL REUS W/ TWL LRG LVL3 (GOWN DISPOSABLE) ×1 IMPLANT
GOWN STRL REUS W/ TWL XL LVL3 (GOWN DISPOSABLE) ×1 IMPLANT
GOWN STRL REUS W/TWL LRG LVL3 (GOWN DISPOSABLE) ×3
GOWN STRL REUS W/TWL XL LVL3 (GOWN DISPOSABLE) ×3
GUARD TEETH (MISCELLANEOUS) IMPLANT
MARKER SKIN DUAL TIP RULER LAB (MISCELLANEOUS) IMPLANT
NDL SAFETY ECLIPSE 18X1.5 (NEEDLE) ×1 IMPLANT
NEEDLE HYPO 18GX1.5 SHARP (NEEDLE) ×3
NEEDLE SPNL 22GX7 QUINCKE BK (NEEDLE) IMPLANT
NS IRRIG 1000ML POUR BTL (IV SOLUTION) ×3 IMPLANT
PACK BASIN DAY SURGERY FS (CUSTOM PROCEDURE TRAY) ×3 IMPLANT
PAD ALCOHOL SWAB (MISCELLANEOUS) ×3 IMPLANT
PATTIES SURGICAL .5 X3 (DISPOSABLE) ×3 IMPLANT
SHEET MEDIUM DRAPE 40X70 STRL (DRAPES) ×3 IMPLANT
SLEEVE SCD COMPRESS KNEE MED (MISCELLANEOUS) ×3 IMPLANT
SOL ANTI FOG 6CC (MISCELLANEOUS) IMPLANT
SOLUTION ANTI FOG 6CC (MISCELLANEOUS)
SOLUTION BUTLER CLEAR DIP (MISCELLANEOUS) IMPLANT
SYR CONTROL 10ML LL (SYRINGE) IMPLANT
TOWEL GREEN STERILE FF (TOWEL DISPOSABLE) ×3 IMPLANT
TUBE CONNECTING 20'X1/4 (TUBING) ×1
TUBE CONNECTING 20X1/4 (TUBING) ×2 IMPLANT

## 2020-07-15 NOTE — Op Note (Signed)
[employer], [employer] MEDICAL RECORD RA:[id number] ACCOUNT [id number] DATE OF BIRTH:Feb 21, 1971 FACILITY: [facility] LOCATION: [facility] PHYSICIAN:Shakendra Griffeth Lincoln Maxin, MD  OPERATIVE REPORT  DATE OF PROCEDURE:  07/15/2020  PREOPERATIVE DIAGNOSIS:  Right vocal cord lesion.  POSTOPERATIVE DIAGNOSIS:  Right vocal cord lesion.  OPERATION PERFORMED:  Microlaryngoscopy and biopsy of anterior right true vocal cord.  SURGEON:  Melony Overly, MD  ANESTHESIA:  General endotracheal.  ESTIMATED BLOOD LOSS:  Minimal.  COMPLICATIONS:  None.  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  He underwent radiation therapy for treatment of this and  completed radiation therapy on 06/18/20021.  He was recently seen by Dr. Isidore Moos in followup and noted to have some abnormality of the anterior right true vocal cord with some leukoplakia and was referred to me for microlaryngoscopy and biopsy.  On exam  in the office, the patient had normal vocal cord mobility bilaterally.  Some abnormality was noted on the anterior portion of the right true vocal cord.  The patient was taken to the operating room at this time for microlaryngoscopy and biopsy.  DESCRIPTION OF PROCEDURE:  After adequate endotracheal anesthesia with a #6 endotracheal tube, a direct laryngoscopy was performed with the anterior laryngoscope.  The base of tongue, epiglottis were normal.  Piriform sinuses were clear.  Vocal cords  were then examined.  The laryngoscope was suspended.  The patient had a slightly erythematous lesion involving the very anterior portion of the right anterior true vocal cord.  A biopsy was obtained with small cup upbiting forceps and scissors.  Specimen  was sent to pathology.  The patient had minimal bleeding that was easily controlled with a cotton pledget soaked in  adrenaline.  This completed the procedure.  The patient was awoken from anesthesia and transferred to recovery room, postoperatively  doing well.  DISPOSITION:  The patient is discharged home later this morning and will either call or follow up in our office in 1 week to review final pathology.  VN/NUANCE  D:07/15/2020 T:07/15/2020 JOB:[id number]

## 2020-07-15 NOTE — Brief Op Note (Signed)
07/15/2020  10:36 AM  PATIENT:  Juan Lewis  49 y.o. male  PRE-OPERATIVE DIAGNOSIS:  HISTORY OF LARYNGEAL CANCER  POST-OPERATIVE DIAGNOSIS:  * No post-op diagnosis entered *  PROCEDURE:  Procedure(s): DIAGNOSTIC LARYNGOSCOPY WITH MICROSCOPE AND BIOPSY (N/A)  SURGEON:  Surgeon(s) and Role:    * Rozetta Nunnery, MD - Primary  PHYSICIAN ASSISTANT:   ASSISTANTS: none   ANESTHESIA:   general  EBL:  minimal   BLOOD ADMINISTERED:none  DRAINS: none   LOCAL MEDICATIONS USED:  NONE  SPECIMEN:  Source of Specimen:  right anterior true vocal cord  DISPOSITION OF SPECIMEN:  PATHOLOGY  COUNTS:  YES  TOURNIQUET:  * No tourniquets in log *  DICTATION: .Note written in EPIC and Other Dictation: Dictation Number [id number]  PLAN OF CARE: Discharge to home after PACU  PATIENT DISPOSITION:  PACU - hemodynamically stable.   Delay start of Pharmacological VTE agent (>24hrs) due to surgical blood loss or risk of bleeding: yes

## 2020-07-15 NOTE — Interval H&P Note (Signed)
History and Physical Interval Note:  07/15/2020 9:51 AM  Juan Lewis  has presented today for surgery, with the diagnosis of HISTORY OF LARYNGEAL CANCER.  The various methods of treatment have been discussed with the patient and family. After consideration of risks, benefits and other options for treatment, the patient has consented to  Procedure(s): DIAGNOSTIC LARYNGOSCOPY WITH MICROSCOPE AND BIOPSY (N/A) as a surgical intervention.  The patient's history has been reviewed, patient examined, no change in status, stable for surgery.  I have reviewed the patient's chart and labs.  Questions were answered to the patient's satisfaction.     Melony Overly

## 2020-07-15 NOTE — Anesthesia Preprocedure Evaluation (Signed)
Anesthesia Evaluation  Patient identified by MRN, date of birth, ID band Patient awake    Reviewed: Allergy & Precautions, NPO status , Patient's Chart, lab work & pertinent test results  History of Anesthesia Complications Negative for: history of anesthetic complications  Airway Mallampati: I  TM Distance: >3 FB Neck ROM: Full    Dental  (+) Dental Advisory Given   Pulmonary former smoker,  Hoarseness with vocal cord lesion 03/20/2019 novel coronavirus NEG   breath sounds clear to auscultation       Cardiovascular negative cardio ROS   Rhythm:Regular Rate:Normal     Neuro/Psych negative neurological ROS     GI/Hepatic negative GI ROS, Neg liver ROS,   Endo/Other  negative endocrine ROS  Renal/GU negative Renal ROS     Musculoskeletal   Abdominal   Peds  Hematology negative hematology ROS (+)   Anesthesia Other Findings Throat Cancer  Reproductive/Obstetrics                             Anesthesia Physical  Anesthesia Plan  ASA: III  Anesthesia Plan: General   Post-op Pain Management:    Induction: Intravenous  PONV Risk Score and Plan: 2 and Ondansetron, Midazolam and Treatment may vary due to age or medical condition  Airway Management Planned: Oral ETT  Additional Equipment:   Intra-op Plan:   Post-operative Plan: Extubation in OR  Informed Consent: I have reviewed the patients History and Physical, chart, labs and discussed the procedure including the risks, benefits and alternatives for the proposed anesthesia with the patient or authorized representative who has indicated his/her understanding and acceptance.     Dental advisory given  Plan Discussed with: CRNA and Surgeon  Anesthesia Plan Comments: (Plan routine monitors, GETA)        Anesthesia Quick Evaluation

## 2020-07-15 NOTE — Anesthesia Procedure Notes (Signed)
Procedure Name: Intubation Performed by: Levar Fayson M, CRNA Pre-anesthesia Checklist: Patient identified, Emergency Drugs available, Suction available and Patient being monitored Patient Re-evaluated:Patient Re-evaluated prior to induction Oxygen Delivery Method: Circle system utilized Preoxygenation: Pre-oxygenation with 100% oxygen Induction Type: IV induction Ventilation: Mask ventilation without difficulty Laryngoscope Size: Mac and 4 Grade View: Grade I Tube type: Oral Tube size: 7.0 mm Number of attempts: 1 Airway Equipment and Method: Stylet and Oral airway Placement Confirmation: ETT inserted through vocal cords under direct vision,  positive ETCO2,  breath sounds checked- equal and bilateral and CO2 detector Secured at: 22 cm Tube secured with: Tape Dental Injury: Teeth and Oropharynx as per pre-operative assessment        

## 2020-07-15 NOTE — Discharge Instructions (Addendum)
Diet as tolerated Tylenol or ibuprofen prn pain Can also use throat lozenges for sore throat as needed Voice will be hoarse for a week or two. Call office for follow up appt in 6-7 days. Can call office concerning results of the biopsy on Friday or Monday    [phone]   Post Anesthesia Home Care Instructions  Activity: Get plenty of rest for the remainder of the day. A responsible individual must stay with you for 24 hours following the procedure.  For the next 24 hours, DO NOT: -Drive a car -Paediatric nurse -Drink alcoholic beverages -Take any medication unless instructed by your physician -Make any legal decisions or sign important papers.  Meals: Start with liquid foods such as gelatin or soup. Progress to regular foods as tolerated. Avoid greasy, spicy, heavy foods. If nausea and/or vomiting occur, drink only clear liquids until the nausea and/or vomiting subsides. Call your physician if vomiting continues.  Special Instructions/Symptoms: Your throat may feel dry or sore from the anesthesia or the breathing tube placed in your throat during surgery. If this causes discomfort, gargle with warm salt water. The discomfort should disappear within 24 hours.  If you had a scopolamine patch placed behind your ear for the management of post- operative nausea and/or vomiting:  1. The medication in the patch is effective for 72 hours, after which it should be removed.  Wrap patch in a tissue and discard in the trash. Wash hands thoroughly with soap and water. 2. You may remove the patch earlier than 72 hours if you experience unpleasant side effects which may include dry mouth, dizziness or visual disturbances. 3. Avoid touching the patch. Wash your hands with soap and water after contact with the patch.

## 2020-07-15 NOTE — Transfer of Care (Signed)
Immediate Anesthesia Transfer of Care Note  Patient: Juan Lewis  Procedure(s) Performed: DIAGNOSTIC LARYNGOSCOPY WITH MICROSCOPE AND BIOPSY (N/A Throat)  Patient Location: PACU  Anesthesia Type:General  Level of Consciousness: awake, alert  and oriented  Airway & Oxygen Therapy: Patient Spontanous Breathing and Patient connected to face mask oxygen  Post-op Assessment: Report given to RN and Post -op Vital signs reviewed and stable  Post vital signs: Reviewed and stable  Last Vitals:  Vitals Value Taken Time  BP 132/101 07/15/20 1050  Temp    Pulse 66 07/15/20 1052  Resp 17 07/15/20 1052  SpO2 100 % 07/15/20 1052  Vitals shown include unvalidated device data.  Last Pain:  Vitals:   07/15/20 0845  TempSrc: Oral  PainSc: 0-No pain         Complications: No complications documented.

## 2020-07-15 NOTE — Anesthesia Postprocedure Evaluation (Signed)
Anesthesia Post Note  Patient: Juan Lewis  Procedure(s) Performed: DIAGNOSTIC LARYNGOSCOPY WITH MICROSCOPE AND BIOPSY (N/A Throat)     Patient location during evaluation: PACU Anesthesia Type: General Level of consciousness: awake and alert Pain management: pain level controlled Vital Signs Assessment: post-procedure vital signs reviewed and stable Respiratory status: spontaneous breathing, nonlabored ventilation and respiratory function stable Cardiovascular status: blood pressure returned to baseline and stable Postop Assessment: no apparent nausea or vomiting Anesthetic complications: no   No complications documented.  Last Vitals:  Vitals:   07/15/20 1115 07/15/20 1132  BP: 125/86 131/88  Pulse: (!) 54 61  Resp: 15 16  Temp:  36.6 C  SpO2: 100% 100%    Last Pain:  Vitals:   07/15/20 1132  TempSrc:   PainSc: 0-No pain                 Lynda Rainwater

## 2020-07-16 LAB — SURGICAL PATHOLOGY

## 2020-07-17 ENCOUNTER — Encounter (HOSPITAL_BASED_OUTPATIENT_CLINIC_OR_DEPARTMENT_OTHER): Payer: Self-pay | Admitting: Otolaryngology

## 2020-07-21 ENCOUNTER — Ambulatory Visit (INDEPENDENT_AMBULATORY_CARE_PROVIDER_SITE_OTHER): Payer: PRIVATE HEALTH INSURANCE | Admitting: Otolaryngology

## 2020-07-21 ENCOUNTER — Other Ambulatory Visit: Payer: Self-pay

## 2020-07-21 DIAGNOSIS — C320 Malignant neoplasm of glottis: Secondary | ICD-10-CM

## 2020-07-21 DIAGNOSIS — Z4889 Encounter for other specified surgical aftercare: Secondary | ICD-10-CM

## 2020-07-21 NOTE — Progress Notes (Signed)
HPI: Juan Lewis is a 49 y.o. male who presents 6 days s/p microlaryngoscopy and biopsy of left vocal cord lesion.  The pathology revealed squamous cell carcinoma.  I reviewed this with him and discussed need for laser excision of the carcinoma with overnight hospitalization..   Past Medical History:  Diagnosis Date  . Hoarseness   . Lesion of vocal cord   . Prostate cancer [facility])    Past Surgical History:  Procedure Laterality Date  . MICROLARYNGOSCOPY N/A 03/23/2019   Procedure: MICROLARYNGOSCOPY WITH EXCISION OF VOCAL CORD LESION;  Surgeon: Rozetta Nunnery, MD;  Location: [place];  Service: ENT;  Laterality: N/A;  . MICROLARYNGOSCOPY N/A 07/15/2020   Procedure: DIAGNOSTIC LARYNGOSCOPY WITH MICROSCOPE AND BIOPSY;  Surgeon: Rozetta Nunnery, MD;  Location: [place];  Service: ENT;  Laterality: N/A;  . PROSTATE BIOPSY     Social History   Socioeconomic History  . Marital status: Married    Spouse name: [name]   . Number of children: 4  . Years of education: Not on file  . Highest education level: Not on file  Occupational History  . Not on file  Tobacco Use  . Smoking status: Former Smoker    Packs/day: 0.50    Years: 30.00    Pack years: 15.00    Start date: 68    Quit date: 04/20/2019    Years since quitting: 1.2  . Smokeless tobacco: Never Used  . Tobacco comment: he quit when he started radiation. 06/15/19  Vaping Use  . Vaping Use: Never used  Substance and Sexual Activity  . Alcohol use: Not Currently    Comment: social  . Drug use: No  . Sexual activity: Not Currently  Other Topics Concern  . Not on file  Social History Narrative  . Not on file   Social Determinants of Health   Financial Resource Strain:   . Difficulty of Paying Living Expenses: Not on file  Food Insecurity:   . Worried About Charity fundraiser in the Last Year: Not on file  . Ran Out of Food in the Last Year: Not on file  Transportation  Needs:   . Lack of Transportation (Medical): Not on file  . Lack of Transportation (Non-Medical): Not on file  Physical Activity:   . Days of Exercise per Week: Not on file  . Minutes of Exercise per Session: Not on file  Stress:   . Feeling of Stress : Not on file  Social Connections:   . Frequency of Communication with Friends and Family: Not on file  . Frequency of Social Gatherings with Friends and Family: Not on file  . Attends Religious Services: Not on file  . Active Member of Clubs or Organizations: Not on file  . Attends Archivist Meetings: Not on file  . Marital Status: Not on file   Family History  Problem Relation Age of Onset  . Prostate cancer Neg Hx   . Breast cancer Neg Hx   . Colon cancer Neg Hx   . Pancreatic cancer Neg Hx    No Known Allergies Prior to Admission medications   Not on File     Physical Exam: Neck exam reveals no palpable adenopathy   Assessment: S/p microlaryngoscopy and biopsy of right anterior true vocal cord.  Plan: Discussed with patient concerning results of the biopsy showing squamous cell carcinoma.  He will require further surgery to excise this and most likely will require laser  excision of the vocal cord carcinoma.   Radene Journey, MD

## 2020-07-22 ENCOUNTER — Ambulatory Visit (INDEPENDENT_AMBULATORY_CARE_PROVIDER_SITE_OTHER): Payer: Self-pay | Admitting: Otolaryngology

## 2020-07-22 DIAGNOSIS — C320 Malignant neoplasm of glottis: Secondary | ICD-10-CM

## 2020-07-22 NOTE — H&P (View-Only) (Signed)
PREOPERATIVE H&P  Chief Complaint: Vocal cord cancer  HPI: Juan Lewis is a 49 y.o. 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 He underwent treatment with radiation therapy that he completed in August 2020. He did well with improvement of his voice. 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 He is taken to the operating room at this time for laser excision of anterior right vocal cord and partial left vocal cord carcinoma.  Past Medical History:  Diagnosis Date  . Hoarseness   . Lesion of vocal cord   . Prostate cancer [facility])    Past Surgical History:  Procedure Laterality Date  . MICROLARYNGOSCOPY N/A 03/23/2019   Procedure: MICROLARYNGOSCOPY WITH EXCISION OF VOCAL CORD LESION;  Surgeon: Rozetta Nunnery, MD;  Location: [place];  Service: ENT;  Laterality: N/A;  . MICROLARYNGOSCOPY N/A 07/15/2020   Procedure: DIAGNOSTIC LARYNGOSCOPY WITH MICROSCOPE AND BIOPSY;  Surgeon: Rozetta Nunnery, MD;  Location: [place];  Service: ENT;  Laterality: N/A;  . PROSTATE BIOPSY     Social History   Socioeconomic History  . Marital status: Married    Spouse name: [name]   . Number of children: 4  . Years of education: Not on file  . Highest education level: Not on file  Occupational History  . Not on file  Tobacco Use  . Smoking status: Former Smoker    Packs/day: 0.50    Years: 30.00    Pack years: 15.00    Start date: 70    Quit date: 04/20/2019    Years since quitting: 1.2  . Smokeless tobacco: Never Used  . Tobacco comment: he quit when he started radiation. 06/15/19  Vaping Use  . Vaping Use: Never used   Substance and Sexual Activity  . Alcohol use: Not Currently    Comment: social  . Drug use: No  . Sexual activity: Not Currently  Other Topics Concern  . Not on file  Social History Narrative  . Not on file   Social Determinants of Health   Financial Resource Strain:   . Difficulty of Paying Living Expenses: Not on file  Food Insecurity:   . Worried About Charity fundraiser in the Last Year: Not on file  . Ran Out of Food in the Last Year: Not on file  Transportation Needs:   . Lack of Transportation (Medical): Not on file  . Lack of Transportation (Non-Medical): Not on file  Physical Activity:   . Days of Exercise per Week: Not on file  . Minutes of Exercise per Session: Not on file  Stress:   . Feeling of Stress : Not on file  Social Connections:   . Frequency of Communication with Friends and Family: Not on file  . Frequency of Social Gatherings with Friends and Family: Not on file  . Attends Religious Services: Not on file  . Active Member of Clubs or Organizations: Not on file  . Attends Archivist Meetings: Not on file  . Marital Status: Not on file   Family History  Problem Relation Age of Onset  . Prostate cancer Neg Hx   . Breast cancer Neg Hx   . Colon  cancer Neg Hx   . Pancreatic cancer Neg Hx    No Known Allergies Prior to Admission medications   Not on File     Positive ROS: Otherwise negative  All other systems have been reviewed and were otherwise negative with the exception of those mentioned in the HPI and as above.  Physical Exam: There were no vitals filed for this visit.  General: Alert, no acute distress Oral: Normal oral mucosa and tonsils Nasal: Clear nasal passages Neck: No palpable adenopathy or thyroid nodules Ear: Ear canal is clear with normal appearing TMs On fiberoptic laryngoscopy patient has normal vocal cord mobility. He has leukoplakia and some erythema of the anterior right true vocal cord and slight erythema of  the left anterior true vocal cord. Cardiovascular: Regular rate and rhythm, no murmur.  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   Melony Overly, MD 07/22/2020 11:15 AM

## 2020-07-22 NOTE — H&P (Signed)
PREOPERATIVE H&P  Chief Complaint: Vocal cord cancer  HPI: Juan Lewis is a 49 y.o. 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 He underwent treatment with radiation therapy that he completed in August 2020. He did well with improvement of his voice. 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 He is taken to the operating room at this time for laser excision of anterior right vocal cord and partial left vocal cord carcinoma.  Past Medical History:  Diagnosis Date  . Hoarseness   . Lesion of vocal cord   . Prostate cancer [facility])    Past Surgical History:  Procedure Laterality Date  . MICROLARYNGOSCOPY N/A 03/23/2019   Procedure: MICROLARYNGOSCOPY WITH EXCISION OF VOCAL CORD LESION;  Surgeon: Rozetta Nunnery, MD;  Location: [place];  Service: ENT;  Laterality: N/A;  . MICROLARYNGOSCOPY N/A 07/15/2020   Procedure: DIAGNOSTIC LARYNGOSCOPY WITH MICROSCOPE AND BIOPSY;  Surgeon: Rozetta Nunnery, MD;  Location: [place];  Service: ENT;  Laterality: N/A;  . PROSTATE BIOPSY     Social History   Socioeconomic History  . Marital status: Married    Spouse name: [name]   . Number of children: 4  . Years of education: Not on file  . Highest education level: Not on file  Occupational History  . Not on file  Tobacco Use  . Smoking status: Former Smoker    Packs/day: 0.50    Years: 30.00    Pack years: 15.00    Start date: 14    Quit date: 04/20/2019    Years since quitting: 1.2  . Smokeless tobacco: Never Used  . Tobacco comment: he quit when he started radiation. 06/15/19  Vaping Use  . Vaping Use: Never used   Substance and Sexual Activity  . Alcohol use: Not Currently    Comment: social  . Drug use: No  . Sexual activity: Not Currently  Other Topics Concern  . Not on file  Social History Narrative  . Not on file   Social Determinants of Health   Financial Resource Strain:   . Difficulty of Paying Living Expenses: Not on file  Food Insecurity:   . Worried About Charity fundraiser in the Last Year: Not on file  . Ran Out of Food in the Last Year: Not on file  Transportation Needs:   . Lack of Transportation (Medical): Not on file  . Lack of Transportation (Non-Medical): Not on file  Physical Activity:   . Days of Exercise per Week: Not on file  . Minutes of Exercise per Session: Not on file  Stress:   . Feeling of Stress : Not on file  Social Connections:   . Frequency of Communication with Friends and Family: Not on file  . Frequency of Social Gatherings with Friends and Family: Not on file  . Attends Religious Services: Not on file  . Active Member of Clubs or Organizations: Not on file  . Attends Archivist Meetings: Not on file  . Marital Status: Not on file   Family History  Problem Relation Age of Onset  . Prostate cancer Neg Hx   . Breast cancer Neg Hx   . Colon  cancer Neg Hx   . Pancreatic cancer Neg Hx    No Known Allergies Prior to Admission medications   Not on File     Positive ROS: Otherwise negative  All other systems have been reviewed and were otherwise negative with the exception of those mentioned in the HPI and as above.  Physical Exam: There were no vitals filed for this visit.  General: Alert, no acute distress Oral: Normal oral mucosa and tonsils Nasal: Clear nasal passages Neck: No palpable adenopathy or thyroid nodules Ear: Ear canal is clear with normal appearing TMs On fiberoptic laryngoscopy patient has normal vocal cord mobility. He has leukoplakia and some erythema of the anterior right true vocal cord and slight erythema of  the left anterior true vocal cord. Cardiovascular: Regular rate and rhythm, no murmur.  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   Melony Overly, MD 07/22/2020 11:15 AM

## 2020-07-25 ENCOUNTER — Encounter (HOSPITAL_COMMUNITY): Payer: Self-pay | Admitting: Otolaryngology

## 2020-07-25 ENCOUNTER — Other Ambulatory Visit: Payer: Self-pay

## 2020-07-25 ENCOUNTER — Other Ambulatory Visit (HOSPITAL_COMMUNITY)
Admission: RE | Admit: 2020-07-25 | Discharge: 2020-07-25 | Disposition: A | Discharge status: Home / Self Care | Payer: PRIVATE HEALTH INSURANCE | Source: Ambulatory Visit | Attending: Otolaryngology | Admitting: Otolaryngology

## 2020-07-25 DIAGNOSIS — Z20822 Contact with and (suspected) exposure to covid-19: Secondary | ICD-10-CM | POA: Insufficient documentation

## 2020-07-25 DIAGNOSIS — Z01812 Encounter for preprocedural laboratory examination: Secondary | ICD-10-CM | POA: Diagnosis present

## 2020-07-25 NOTE — Progress Notes (Signed)
Same Day Work-Up Phone Call:  PCP - Dr. Franne Forts [facility] Cardiologist - Denies  PPM/ICD - N/A  Chest x-ray - N/A EKG - N/A Stress Test - Denies ECHO - Denies Cardiac Cath - Denies  Sleep Study - N/A  Patient denies being diabetic.  Blood Thinner Instructions: N/A Aspirin Instructions: N/A  ERAS Protcol - N/A PRE-SURGERY Ensure or G2- N/A  COVID TEST- 07/25/20   Anesthesia review: No  -----------------------------------------------------------------------  Patient Instructions:   Your procedure is scheduled on Monday, October 18, from 07:30 AM- 10:00 AM .  Report to [facility] Main Entrance "A" at 05:30 A.M., and check in at the Admitting office.  Call this number if you have problems the morning of surgery:  [phone]    Remember:  Do not eat or drink after midnight the night before your surgery.    Take these medicines the morning of surgery with A SIP OF WATER: NONE  As of today, STOP taking any Aspirin (unless otherwise instructed by your surgeon) Aleve, Naproxen, Ibuprofen, Motrin, Advil, Goody's, BC's, all herbal medications, fish oil, and all vitamins.  Day of Surgery: Shower Wear Clean/Comfortable clothing the morning of surgery Do not apply any deodorants/lotions.               Do not wear jewelry, make up, or nail polish Remember to brush your teeth WITH YOUR REGULAR TOOTHPASTE.             Men may shave face and neck.            Do not bring valuables to the hospital.            [facility] is not responsible for any belongings or valuables.    Do NOT Smoke (Tobacco/Vaping) or drink Alcohol 24 hours prior to your procedure.  If you use a CPAP at night, you may bring all equipment for your overnight stay.   Contacts, glasses, dentures or bridgework may not be worn into surgery.      Patients discharged the day of surgery will not be allowed to drive home, and someone needs to stay with them for 24 hours.

## 2020-07-26 LAB — SARS CORONAVIRUS 2 (TAT 6-24 HRS): SARS Coronavirus 2: NEGATIVE

## 2020-07-27 NOTE — Anesthesia Preprocedure Evaluation (Addendum)
Anesthesia Evaluation  Patient identified by MRN, date of birth, ID band Patient awake    Reviewed: Allergy & Precautions, NPO status , Patient's Chart, lab work & pertinent test results  Airway Mallampati: III  TM Distance: >3 FB Neck ROM: Full    Dental no notable dental hx.    Pulmonary Patient abstained from smoking., former smoker,    Pulmonary exam normal breath sounds clear to auscultation       Cardiovascular negative cardio ROS Normal cardiovascular exam Rhythm:Regular Rate:Normal     Neuro/Psych Hoarseness negative psych ROS   GI/Hepatic negative GI ROS, Neg liver ROS,   Endo/Other  negative endocrine ROS  Renal/GU negative Renal ROS     Musculoskeletal negative musculoskeletal ROS (+)   Abdominal   Peds  Hematology negative hematology ROS (+)   Anesthesia Other Findings SQUAMOUS CELL CARCINOMA RIGHT VOCAL CORD  Reproductive/Obstetrics                            Anesthesia Physical Anesthesia Plan  ASA: II  Anesthesia Plan: General   Post-op Pain Management:    Induction: Intravenous  PONV Risk Score and Plan: 3 and Ondansetron, Dexamethasone, Midazolam and Treatment may vary due to age or medical condition  Airway Management Planned: Oral ETT and Video Laryngoscope Planned  Additional Equipment:   Intra-op Plan:   Post-operative Plan: Extubation in OR  Informed Consent: I have reviewed the patients History and Physical, chart, labs and discussed the procedure including the risks, benefits and alternatives for the proposed anesthesia with the patient or authorized representative who has indicated his/her understanding and acceptance.     Dental advisory given  Plan Discussed with: CRNA  Anesthesia Plan Comments:        Anesthesia Quick Evaluation

## 2020-07-28 ENCOUNTER — Ambulatory Visit (HOSPITAL_COMMUNITY): Payer: No Typology Code available for payment source | Admitting: Certified Registered"

## 2020-07-28 ENCOUNTER — Encounter (HOSPITAL_COMMUNITY)
Admission: RE | Disposition: A | Discharge status: Home / Self Care | Payer: Self-pay | Source: Home / Self Care | Attending: Otolaryngology

## 2020-07-28 ENCOUNTER — Encounter (HOSPITAL_COMMUNITY): Payer: Self-pay | Admitting: Otolaryngology

## 2020-07-28 ENCOUNTER — Ambulatory Visit (HOSPITAL_COMMUNITY)
Admission: RE | Admit: 2020-07-28 | Discharge: 2020-07-28 | Disposition: A | Discharge status: Home / Self Care | Payer: No Typology Code available for payment source | Attending: Otolaryngology | Admitting: Otolaryngology

## 2020-07-28 ENCOUNTER — Other Ambulatory Visit: Payer: Self-pay

## 2020-07-28 DIAGNOSIS — C320 Malignant neoplasm of glottis: Secondary | ICD-10-CM | POA: Insufficient documentation

## 2020-07-28 DIAGNOSIS — Z8521 Personal history of malignant neoplasm of larynx: Secondary | ICD-10-CM | POA: Diagnosis not present

## 2020-07-28 DIAGNOSIS — Z87891 Personal history of nicotine dependence: Secondary | ICD-10-CM | POA: Insufficient documentation

## 2020-07-28 DIAGNOSIS — Z923 Personal history of irradiation: Secondary | ICD-10-CM | POA: Diagnosis not present

## 2020-07-28 HISTORY — PX: MICROLARYNGOSCOPY WITH CO2 LASER AND EXCISION OF VOCAL CORD LESION: SHX5970

## 2020-07-28 LAB — CBC
HCT: 41.7 % (ref 39.0–52.0)
Hemoglobin: 13.3 g/dL (ref 13.0–17.0)
MCH: 29.8 pg (ref 26.0–34.0)
MCHC: 31.9 g/dL (ref 30.0–36.0)
MCV: 93.3 fL (ref 80.0–100.0)
Platelets: 234 10*3/uL (ref 150–400)
RBC: 4.47 MIL/uL (ref 4.22–5.81)
RDW: 14.4 % (ref 11.5–15.5)
WBC: 3.1 10*3/uL — ABNORMAL LOW (ref 4.0–10.5)
nRBC: 0 % (ref 0.0–0.2)

## 2020-07-28 SURGERY — MICROLARYNGOSCOPY WITH CO2 LASER AND EXCISION OF VOCAL CORD LESION
Anesthesia: General

## 2020-07-28 MED ORDER — ROCURONIUM BROMIDE 10 MG/ML (PF) SYRINGE
PREFILLED_SYRINGE | INTRAVENOUS | Status: DC | PRN
  Administered 2020-07-28: 60 mg via INTRAVENOUS

## 2020-07-28 MED ORDER — FENTANYL CITRATE (PF) 250 MCG/5ML IJ SOLN
INTRAMUSCULAR | Status: AC
  Filled 2020-07-28: qty 5

## 2020-07-28 MED ORDER — AMOXICILLIN-POT CLAVULANATE 875-125 MG PO TABS: 1.0000 | ORAL_TABLET | Freq: Two times a day (BID) | ORAL | 0 refills | Status: AC

## 2020-07-28 MED ORDER — STERILE WATER FOR IRRIGATION IR SOLN: Status: DC | PRN

## 2020-07-28 MED ORDER — DEXAMETHASONE SODIUM PHOSPHATE 10 MG/ML IJ SOLN
INTRAMUSCULAR | Status: DC | PRN
  Administered 2020-07-28: 10 mg via INTRAVENOUS

## 2020-07-28 MED ORDER — KETOROLAC TROMETHAMINE 30 MG/ML IJ SOLN
30.0000 mg | Freq: Once | INTRAMUSCULAR | Status: AC | PRN
  Administered 2020-07-28: 30 mg via INTRAVENOUS

## 2020-07-28 MED ORDER — KETOROLAC TROMETHAMINE 30 MG/ML IJ SOLN
INTRAMUSCULAR | Status: AC
  Filled 2020-07-28: qty 1

## 2020-07-28 MED ORDER — OXYCODONE HCL 5 MG PO TABS
ORAL_TABLET | ORAL | Status: AC
  Filled 2020-07-28: qty 1

## 2020-07-28 MED ORDER — ROCURONIUM BROMIDE 10 MG/ML (PF) SYRINGE
PREFILLED_SYRINGE | INTRAVENOUS | Status: AC
  Filled 2020-07-28: qty 10

## 2020-07-28 MED ORDER — MIDAZOLAM HCL 5 MG/5ML IJ SOLN
INTRAMUSCULAR | Status: DC | PRN
  Administered 2020-07-28: 2 mg via INTRAVENOUS

## 2020-07-28 MED ORDER — EPINEPHRINE HCL (NASAL) 0.1 % NA SOLN
NASAL | Status: AC
  Filled 2020-07-28: qty 30

## 2020-07-28 MED ORDER — EPINEPHRINE PF 1 MG/ML IJ SOLN
INTRAMUSCULAR | Status: DC | PRN
  Administered 2020-07-28: 30 mL

## 2020-07-28 MED ORDER — METHYLENE BLUE 0.5 % INJ SOLN
INTRAVENOUS | Status: AC
  Filled 2020-07-28: qty 10

## 2020-07-28 MED ORDER — HYDROMORPHONE HCL 1 MG/ML IJ SOLN
0.2500 mg | INTRAMUSCULAR | Status: DC | PRN
  Administered 2020-07-28 (×2): 0.25 mg via INTRAVENOUS

## 2020-07-28 MED ORDER — CEFAZOLIN SODIUM-DEXTROSE 2-4 GM/100ML-% IV SOLN
2.0000 g | INTRAVENOUS | Status: AC
  Administered 2020-07-28: 2 g via INTRAVENOUS
  Filled 2020-07-28: qty 100

## 2020-07-28 MED ORDER — OXYCODONE HCL 5 MG PO TABS
5.0000 mg | ORAL_TABLET | Freq: Once | ORAL | Status: AC | PRN
  Administered 2020-07-28: 5 mg via ORAL

## 2020-07-28 MED ORDER — OXYCODONE HCL 5 MG/5ML PO SOLN: 5.0000 mg | Freq: Once | ORAL | Status: AC | PRN

## 2020-07-28 MED ORDER — SODIUM CHLORIDE (PF) 0.9 % IJ SOLN
INTRAMUSCULAR | Status: AC
  Filled 2020-07-28: qty 10

## 2020-07-28 MED ORDER — CHLORHEXIDINE GLUCONATE CLOTH 2 % EX PADS: 6.0000 | MEDICATED_PAD | Freq: Once | CUTANEOUS | Status: DC

## 2020-07-28 MED ORDER — FENTANYL CITRATE (PF) 100 MCG/2ML IJ SOLN
INTRAMUSCULAR | Status: DC | PRN
  Administered 2020-07-28: 50 ug via INTRAVENOUS
  Administered 2020-07-28: 100 ug via INTRAVENOUS

## 2020-07-28 MED ORDER — OMEPRAZOLE 20 MG PO CPDR: 20.0000 mg | DELAYED_RELEASE_CAPSULE | Freq: Two times a day (BID) | ORAL | 1 refills | Status: DC

## 2020-07-28 MED ORDER — LIDOCAINE 2% (20 MG/ML) 5 ML SYRINGE
INTRAMUSCULAR | Status: DC | PRN
  Administered 2020-07-28: 40 mg via INTRAVENOUS

## 2020-07-28 MED ORDER — CHLORHEXIDINE GLUCONATE 0.12 % MT SOLN
OROMUCOSAL | Status: AC
  Administered 2020-07-28: 15 mL via OROMUCOSAL
  Filled 2020-07-28: qty 15

## 2020-07-28 MED ORDER — LIDOCAINE 2% (20 MG/ML) 5 ML SYRINGE
INTRAMUSCULAR | Status: AC
  Filled 2020-07-28: qty 5

## 2020-07-28 MED ORDER — PROMETHAZINE HCL 25 MG/ML IJ SOLN: 6.2500 mg | INTRAMUSCULAR | Status: DC | PRN

## 2020-07-28 MED ORDER — ORAL CARE MOUTH RINSE: 15.0000 mL | Freq: Once | OROMUCOSAL | Status: AC

## 2020-07-28 MED ORDER — HYDROMORPHONE HCL 1 MG/ML IJ SOLN
INTRAMUSCULAR | Status: AC
  Filled 2020-07-28: qty 1

## 2020-07-28 MED ORDER — DEXAMETHASONE SODIUM PHOSPHATE 10 MG/ML IJ SOLN
INTRAMUSCULAR | Status: AC
  Filled 2020-07-28: qty 1

## 2020-07-28 MED ORDER — PROPOFOL 10 MG/ML IV BOLUS
INTRAVENOUS | Status: AC
  Filled 2020-07-28: qty 20

## 2020-07-28 MED ORDER — 0.9 % SODIUM CHLORIDE (POUR BTL) OPTIME
TOPICAL | Status: DC | PRN
  Administered 2020-07-28: 1000 mL

## 2020-07-28 MED ORDER — ACETAMINOPHEN 500 MG PO TABS
1000.0000 mg | ORAL_TABLET | Freq: Once | ORAL | Status: AC
  Administered 2020-07-28: 1000 mg via ORAL
  Filled 2020-07-28: qty 2

## 2020-07-28 MED ORDER — ONDANSETRON HCL 4 MG/2ML IJ SOLN
INTRAMUSCULAR | Status: AC
  Filled 2020-07-28: qty 2

## 2020-07-28 MED ORDER — SUGAMMADEX SODIUM 200 MG/2ML IV SOLN
INTRAVENOUS | Status: DC | PRN
  Administered 2020-07-28: 200 mg via INTRAVENOUS

## 2020-07-28 MED ORDER — ONDANSETRON HCL 4 MG/2ML IJ SOLN
INTRAMUSCULAR | Status: DC | PRN
  Administered 2020-07-28: 4 mg via INTRAVENOUS

## 2020-07-28 MED ORDER — LACTATED RINGERS IV SOLN: INTRAVENOUS | Status: DC

## 2020-07-28 MED ORDER — PROPOFOL 10 MG/ML IV BOLUS
INTRAVENOUS | Status: DC | PRN
  Administered 2020-07-28: 200 mg via INTRAVENOUS

## 2020-07-28 MED ORDER — MIDAZOLAM HCL 2 MG/2ML IJ SOLN
INTRAMUSCULAR | Status: AC
  Filled 2020-07-28: qty 2

## 2020-07-28 MED ORDER — CHLORHEXIDINE GLUCONATE 0.12 % MT SOLN: 15.0000 mL | Freq: Once | OROMUCOSAL | Status: AC

## 2020-07-28 SURGICAL SUPPLY — 27 items
BLADE SURG 15 STRL LF DISP TIS (BLADE) IMPLANT
BLADE SURG 15 STRL SS (BLADE)
BNDG EYE OVAL (GAUZE/BANDAGES/DRESSINGS) ×4 IMPLANT
CANISTER SUCT 1200ML W/VALVE (MISCELLANEOUS) ×2 IMPLANT
COVER WAND RF STERILE (DRAPES) ×2 IMPLANT
DEPRESSOR TONGUE BLADE STERILE (MISCELLANEOUS) ×2 IMPLANT
DRAPE HALF SHEET 40X57 (DRAPES) ×2 IMPLANT
FILTER 7/8 IN (FILTER) ×2 IMPLANT
GAUZE SPONGE 4X4 12PLY STRL LF (GAUZE/BANDAGES/DRESSINGS) ×4 IMPLANT
GLOVE SS BIOGEL STRL SZ 7.5 (GLOVE) ×1 IMPLANT
GLOVE SUPERSENSE BIOGEL SZ 7.5 (GLOVE) ×1
GOWN STRL REUS W/ TWL LRG LVL3 (GOWN DISPOSABLE) ×1 IMPLANT
GOWN STRL REUS W/TWL LRG LVL3 (GOWN DISPOSABLE) ×1
GUARD TEETH (MISCELLANEOUS) IMPLANT
KIT BASIN OR (CUSTOM PROCEDURE TRAY) ×2 IMPLANT
MARKER SKIN DUAL TIP RULER LAB (MISCELLANEOUS) IMPLANT
NEEDLE HYPO 25GX1X1/2 BEV (NEEDLE) IMPLANT
NEEDLE SPNL 22GX3.5 QUINCKE BK (NEEDLE) IMPLANT
NS IRRIG 1000ML POUR BTL (IV SOLUTION) ×2 IMPLANT
PACK SURGICAL SETUP 50X90 (CUSTOM PROCEDURE TRAY) ×2 IMPLANT
PATTIES SURGICAL .5 X3 (DISPOSABLE) ×2 IMPLANT
SOLUTION BUTLER CLEAR DIP (MISCELLANEOUS) ×2 IMPLANT
SYR BULB IRRIG 60ML STRL (SYRINGE) ×2 IMPLANT
SYR CONTROL 10ML LL (SYRINGE) IMPLANT
TOWEL GREEN STERILE FF (TOWEL DISPOSABLE) ×4 IMPLANT
TUBE CONNECTING 12X1/4 (SUCTIONS) ×2 IMPLANT
TUBE CONNECTING 20X1/4 (TUBING) ×4 IMPLANT

## 2020-07-28 NOTE — Brief Op Note (Signed)
07/28/2020  8:37 AM  PATIENT:  Tip Sadek  49 y.o. male  PRE-OPERATIVE DIAGNOSIS:  SQUAMOUS CELL CARCINOMA RIGHT VOCAL CORD  POST-OPERATIVE DIAGNOSIS:  SQUAMOUS CELL CARCINOMA RIGHT VOCAL CORD  PROCEDURE:  Procedure(s): MICROLARYNGOSCOPY WITH CO2 LASER AND EXCISION OF VOCAL CORD LESION (N/A) Right anterior VC, anterior commissure, and anterior L TVC  SURGEON:  Surgeon(s) and Role:    Rozetta Nunnery, MD - Primary  PHYSICIAN ASSISTANT:   ASSISTANTS: none   ANESTHESIA:   general  EBL:  minimal   BLOOD ADMINISTERED:none  DRAINS: none   LOCAL MEDICATIONS USED:  NONE  SPECIMEN:  Source of Specimen:  right anterior vocal cord and anterior commissure  DISPOSITION OF SPECIMEN:  PATHOLOGY  COUNTS:  YES  TOURNIQUET:  * No tourniquets in log *  DICTATION: .Other Dictation: Dictation Number [id number]  PLAN OF CARE: Discharge to home after PACU  PATIENT DISPOSITION:  PACU - hemodynamically stable.   Delay start of Pharmacological VTE agent (>24hrs) due to surgical blood loss or risk of bleeding: yes

## 2020-07-28 NOTE — Op Note (Signed)
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  OPERATION PERFORMED:  Microlaryngoscopy with CO2 laser excision of right anterior true vocal cord, anterior commissure and superficial left anterior true vocal cord.  SURGEON:  Melony Overly, MD.  ANESTHESIA:  General endotracheal.  COMPLICATIONS:  None.  BRIEF CLINICAL NOTE:  The patient is a 49 year old gentleman who is approximately 16 months status post diagnosis of a T2 right anterior true vocal cord carcinoma, status post completion of radiation therapy 14 months ago.  He was noted recently on exam  to have an abnormality of leukoplakia, anterior true vocal cord and underwent biopsy 2 weeks ago that showed squamous cell carcinoma.  He is taken to the operating room at this time for CO2 laser excision of right anterior true vocal cord and anterior  commissure.  He had normal vocal cord mobility with minimal abnormality noted on fiberoptic laryngoscopy following biopsy.  He had some slight erythema of the left anterior true vocal cord and we will plan on biopsy of this area during the procedure.  DESCRIPTION OF PROCEDURE:  After adequate endotracheal anesthesia with a laser endotracheal tube, the laser laryngoscope was suspended to view the vocal cord.  Photos were obtained.  First, a biopsy was obtained of the anterior left true vocal cord that  was erythematous.  An additional biopsy revealed dysplasia with no frank squamous cell carcinoma.  Next, using the CO2 laser, the anterior approximately one-third of the right true vocal cord was lasered with the CO2 laser at continuous 6 watts all the  way up to the  anterior commissure.  In addition, the erythematous left anterior true vocal cord was also lasered of the abnormal mucosa.  This completed the procedure.  Photos were obtained following completion.  The patient was awoken from anesthesia  and transferred to recovery room, postoperatively doing well.  DISPOSITION:  The patient will be discharged home later this morning on amoxicillin 875 mg b.i.d. for [redacted] week along with omeprazole 40 mg daily for the next month.  He will follow up in my office in 1 to 2 weeks for recheck.  IN/NUANCE  D:07/28/2020 T:07/28/2020 JOB:[id number]

## 2020-07-28 NOTE — Discharge Instructions (Addendum)
Take your regular meds. Rest voice as much as possible Tylenol or ibuprofen prn pain Can use throat lozenges for sore throat as needed Amoxicillin 875 mg bid for the next week Omeprazole 40 mg qd for the next month Call office for follow up appt in the next 1-2 weeks    [phone]

## 2020-07-28 NOTE — Progress Notes (Signed)
0.5 mg of dilaudid wasted in steri cycle with Orie Fisherman, RN.  Rowe Pavy, RN

## 2020-07-28 NOTE — Interval H&P Note (Signed)
History and Physical Interval Note:  07/28/2020 7:25 AM  Juan Lewis  has presented today for surgery, with the diagnosis of [place].  The various methods of treatment have been discussed with the patient and family. After consideration of risks, benefits and other options for treatment, the patient has consented to  Procedure(s): MICROLARYNGOSCOPY WITH CO2 LASER AND EXCISION OF VOCAL CORD LESION (N/A) as a surgical intervention.  The patient's history has been reviewed, patient examined, no change in status, stable for surgery.  I have reviewed the patient's chart and labs.  Questions were answered to the patient's satisfaction.     Melony Overly

## 2020-07-28 NOTE — Anesthesia Postprocedure Evaluation (Signed)
Anesthesia Post Note  Patient: Juan Lewis  Procedure(s) Performed: MICROLARYNGOSCOPY WITH CO2 LASER AND EXCISION OF VOCAL CORD LESION (N/A )     Patient location during evaluation: PACU Anesthesia Type: General Level of consciousness: awake and alert Pain management: pain level controlled Vital Signs Assessment: post-procedure vital signs reviewed and stable Respiratory status: spontaneous breathing, nonlabored ventilation, respiratory function stable and patient connected to nasal cannula oxygen Cardiovascular status: blood pressure returned to baseline and stable Postop Assessment: no apparent nausea or vomiting Anesthetic complications: no   No complications documented.  Last Vitals:  Vitals:   07/28/20 0900 07/28/20 0915  BP: (!) 141/89 (!) 132/97  Pulse: 73 (!) 56  Resp: 19 16  Temp:  36.5 C  SpO2: 98% 98%    Last Pain:  Vitals:   07/28/20 0915  TempSrc:   PainSc: 0-No pain                 Betta Balla P Makinsley Schiavi

## 2020-07-28 NOTE — Transfer of Care (Signed)
Immediate Anesthesia Transfer of Care Note  Patient: Chadric Mink  Procedure(s) Performed: MICROLARYNGOSCOPY WITH CO2 LASER AND EXCISION OF VOCAL CORD LESION (N/A )  Patient Location: PACU  Anesthesia Type:General  Level of Consciousness: drowsy and patient cooperative  Airway & Oxygen Therapy: Patient Spontanous Breathing and Patient connected to nasal cannula oxygen  Post-op Assessment: Report given to RN and Post -op Vital signs reviewed and stable  Post vital signs: Reviewed and stable  Last Vitals:  Vitals Value Taken Time  BP 145/99 07/28/20 0843  Temp    Pulse 81 07/28/20 0842  Resp 18 07/28/20 0842  SpO2 97 % 07/28/20 0842  Vitals shown include unvalidated device data.  Last Pain:  Vitals:   07/28/20 0614  TempSrc: Oral  PainSc:          Complications: No complications documented.

## 2020-07-28 NOTE — Anesthesia Procedure Notes (Signed)
Procedure Name: Intubation Date/Time: 07/28/2020 7:39 AM Performed by: Moshe Salisbury, CRNA Pre-anesthesia Checklist: Patient identified, Emergency Drugs available, Suction available and Patient being monitored Patient Re-evaluated:Patient Re-evaluated prior to induction Oxygen Delivery Method: Circle System Utilized Preoxygenation: Pre-oxygenation with 100% oxygen Induction Type: IV induction Ventilation: Mask ventilation without difficulty Laryngoscope Size: Glidescope and 4 Tube type: Oral Laser Tube: Laser Tube and Cuffed inflated with minimal occlusive pressure - saline Tube size: 6.0 mm Number of attempts: 1 Airway Equipment and Method: Video-laryngoscopy and Rigid stylet Placement Confirmation: ETT inserted through vocal cords under direct vision,  positive ETCO2 and breath sounds checked- equal and bilateral Tube secured with: Tape Dental Injury: Teeth and Oropharynx as per pre-operative assessment

## 2020-07-29 ENCOUNTER — Encounter (HOSPITAL_COMMUNITY): Payer: Self-pay | Admitting: Otolaryngology

## 2020-07-30 ENCOUNTER — Ambulatory Visit (INDEPENDENT_AMBULATORY_CARE_PROVIDER_SITE_OTHER): Payer: PRIVATE HEALTH INSURANCE | Admitting: Otolaryngology

## 2020-07-30 ENCOUNTER — Other Ambulatory Visit: Payer: Self-pay

## 2020-07-30 DIAGNOSIS — Z4889 Encounter for other specified surgical aftercare: Secondary | ICD-10-CM

## 2020-07-30 LAB — SURGICAL PATHOLOGY

## 2020-07-30 NOTE — Progress Notes (Signed)
HPI: Juan Lewis is a 49 y.o. male who presents 2 days s/p microlaryngoscopy with CO2 laser excision of right anterior true vocal cord.  Pathology called me this morning.  Apparently he did have a small amount of squamous cell carcinoma in the right vocal cord excision specimen but there was minimal carcinoma within the specimen.  The left vocal cord biopsy was just mild dysplasia.  Patient is breathing well although he is moderately hoarse.[name]   Past Medical History:  Diagnosis Date  . Hoarseness   . Lesion of vocal cord   . Prostate cancer [facility])    Past Surgical History:  Procedure Laterality Date  . MICROLARYNGOSCOPY N/A 03/23/2019   Procedure: MICROLARYNGOSCOPY WITH EXCISION OF VOCAL CORD LESION;  Surgeon: Rozetta Nunnery, MD;  Location: [place];  Service: ENT;  Laterality: N/A;  . MICROLARYNGOSCOPY N/A 07/15/2020   Procedure: DIAGNOSTIC LARYNGOSCOPY WITH MICROSCOPE AND BIOPSY;  Surgeon: Rozetta Nunnery, MD;  Location: [place];  Service: ENT;  Laterality: N/A;  . MICROLARYNGOSCOPY WITH CO2 LASER AND EXCISION OF VOCAL CORD LESION N/A 07/28/2020   Procedure: MICROLARYNGOSCOPY WITH CO2 LASER AND EXCISION OF VOCAL CORD LESION;  Surgeon: Rozetta Nunnery, MD;  Location: [place];  Service: ENT;  Laterality: N/A;  . PROSTATE BIOPSY     Social History   Socioeconomic History  . Marital status: Married    Spouse name: [name]   . Number of children: 4  . Years of education: Not on file  . Highest education level: Not on file  Occupational History  . Not on file  Tobacco Use  . Smoking status: Former Smoker    Packs/day: 0.50    Years: 30.00    Pack years: 15.00    Start date: 68    Quit date: 04/20/2019    Years since quitting: 1.2  . Smokeless tobacco: Never Used  . Tobacco comment: he quit when he started radiation. 06/15/19  Vaping Use  . Vaping Use: Never used  Substance and Sexual Activity  . Alcohol use: Not Currently     Comment: social  . Drug use: No  . Sexual activity: Not Currently  Other Topics Concern  . Not on file  Social History Narrative  . Not on file   Social Determinants of Health   Financial Resource Strain:   . Difficulty of Paying Living Expenses: Not on file  Food Insecurity:   . Worried About Charity fundraiser in the Last Year: Not on file  . Ran Out of Food in the Last Year: Not on file  Transportation Needs:   . Lack of Transportation (Medical): Not on file  . Lack of Transportation (Non-Medical): Not on file  Physical Activity:   . Days of Exercise per Week: Not on file  . Minutes of Exercise per Session: Not on file  Stress:   . Feeling of Stress : Not on file  Social Connections:   . Frequency of Communication with Friends and Family: Not on file  . Frequency of Social Gatherings with Friends and Family: Not on file  . Attends Religious Services: Not on file  . Active Member of Clubs or Organizations: Not on file  . Attends Archivist Meetings: Not on file  . Marital Status: Not on file   Family History  Problem Relation Age of Onset  . Prostate cancer Neg Hx   . Breast cancer Neg Hx   . Colon cancer Neg Hx   .  Pancreatic cancer Neg Hx    No Known Allergies Prior to Admission medications   Medication Sig Start Date End Date Taking? Authorizing Provider  amoxicillin-clavulanate (AUGMENTIN) 875-125 MG tablet Take 1 tablet by mouth 2 (two) times daily for 7 days. 07/28/20 08/04/20  Rozetta Nunnery, MD  omeprazole (PRILOSEC) 20 MG capsule Take 1 capsule (20 mg total) by mouth 2 (two) times daily. 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  He will follow-up in 2 to 3 weeks for recheck.  Dr. Isidore Moos has him  scheduled for follow-up in the spring of next year and recommended that I follow-up with him in January or February.   Radene Journey, MD

## 2020-08-06 ENCOUNTER — Encounter (INDEPENDENT_AMBULATORY_CARE_PROVIDER_SITE_OTHER): Payer: PRIVATE HEALTH INSURANCE | Admitting: Otolaryngology

## 2020-08-07 IMAGING — CT CT NECK WITH CONTRAST
1 series · 1 of 1 positions shown · IV contrast (iopamidol)
Comparison: None.

CLINICAL DATA: Squamous cell carcinoma of right vocal cord.

EXAM:
CT NECK WITH CONTRAST
TECHNIQUE: Multidetector CT imaging of the neck was performed using the
standard protocol following the bolus administration of intravenous
contrast.
CONTRAST:  75mL 7GESQ5-AXX IOPAMIDOL (7GESQ5-AXX) INJECTION 61%

[Series 1: topogram 0.70 tr20 · coronal · 1.50mm/px · 1 of 1 slices shown]
[im 1/1]
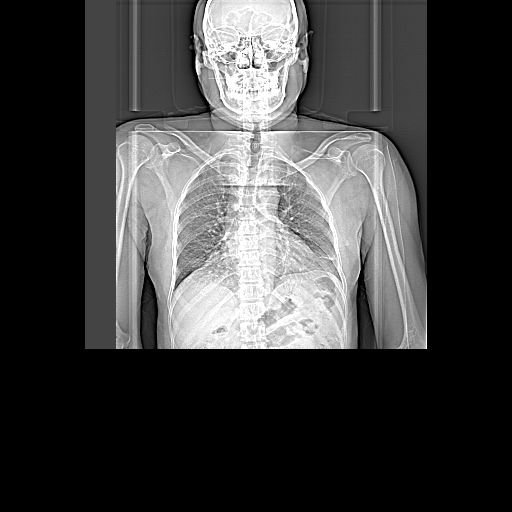

[1 of 1 positions shown; findings below may reference images not displayed]

FINDINGS: Pharynx and larynx: Symmetric pharyngeal soft tissues. Approximately
8 x 4 mm enhancing lesion involving the right true vocal cord
anteriorly with extension to the anterior commissure but without
evidence of extension across the midline. Patent airway.

Salivary glands: No inflammation, mass, or stone.

Thyroid: Unremarkable.

Lymph nodes: No enlarged or suspicious lymph nodes in the neck.

Vascular: Major vascular structures of the neck are patent.

Limited intracranial: Unremarkable.

Visualized orbits: Unremarkable.

Mastoids and visualized paranasal sinuses: Mild right greater than
left maxillary sinus mucosal thickening, polypoid on the right.
Clear mastoid air cells.

Skeleton: No suspicious osseous lesion.

Upper chest: Clear lung apices.

Other: None.
IMPRESSION: 1. Right vocal cord lesion consistent with known squamous cell
carcinoma.
2. No evidence of metastatic disease in the neck.

## 2020-08-07 IMAGING — CT CT CHEST WITH CONTRAST
5 of 10 series · 14 of 36 positions shown, 15 images · IV contrast (iopamidol)
Comparison: None.

CLINICAL DATA: Squamous cell carcinoma RIGHT vocal cord

EXAM:
CT CHEST WITH CONTRAST
TECHNIQUE: Multidetector CT imaging of the chest was performed during
intravenous contrast administration.
CONTRAST:  75mL [id number] IOPAMIDOL ([id number]) INJECTION 61%

[Series 3: chest 2.00 br40 s3 · axial · 0.48mm/px · z∈[-935,-783]mm · 3 of 154 slices shown]
[im 39/154  lung]
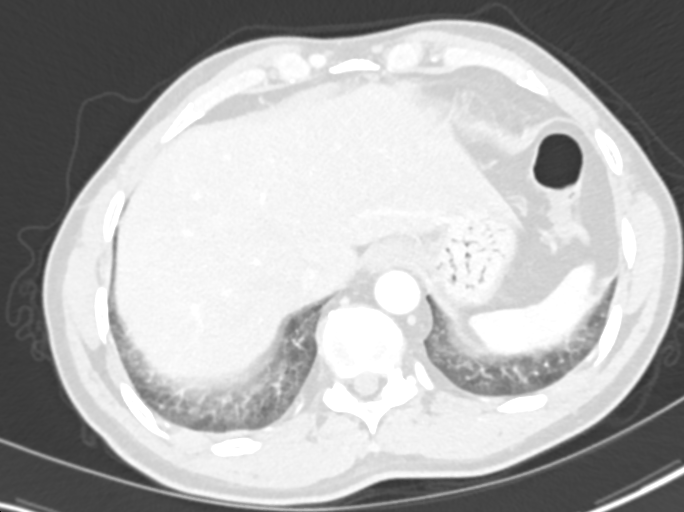
[im 77/154  lung]
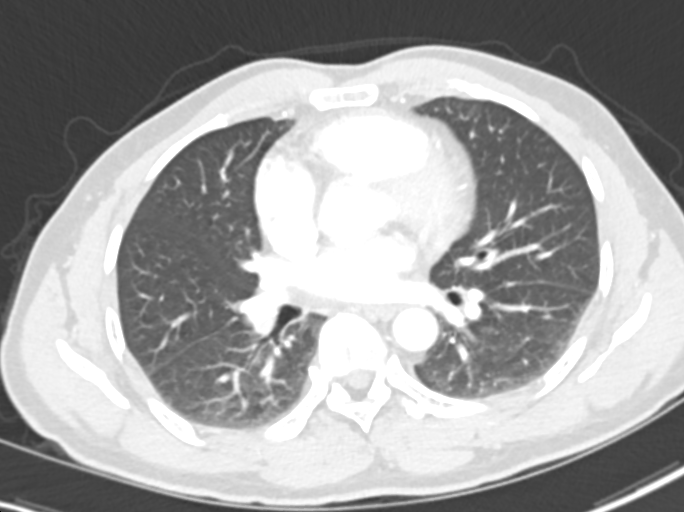
[im 115/154  lung]
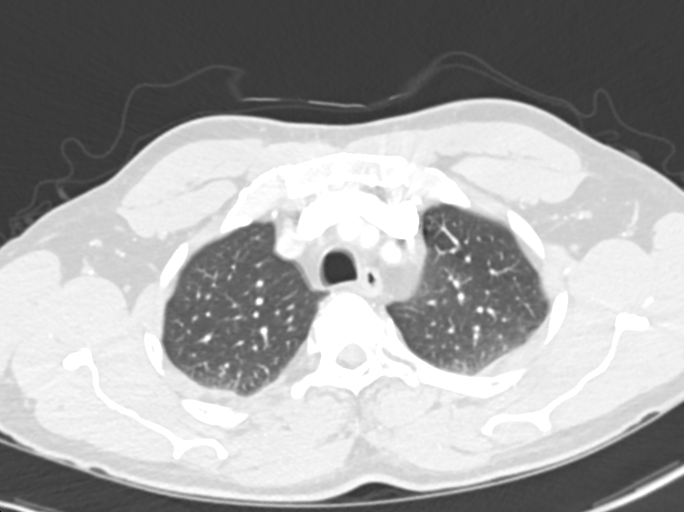

[Series 9: chest 2.00 br60 s3 · axial · 0.64mm/px · z∈[-951,-767]mm · 4 of 154 slices shown, 5 images]
[im 31/154  mediastinal]
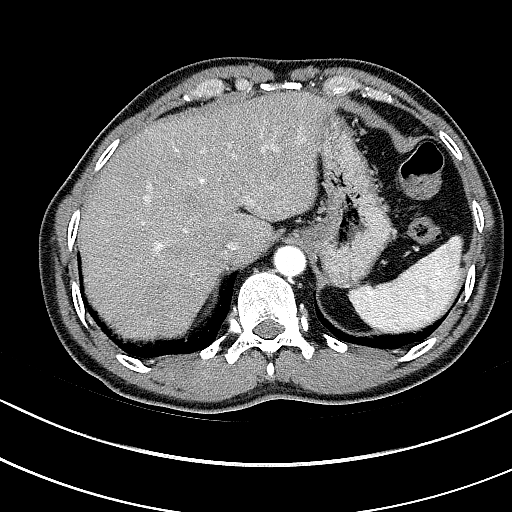
[im 31/154  lung]
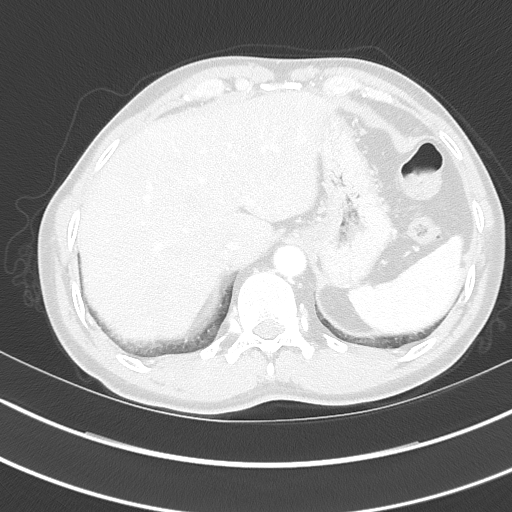
[im 62/154  lung]
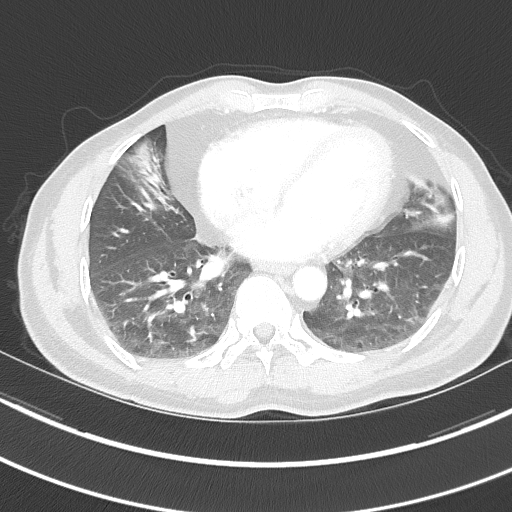
[im 92/154  lung]
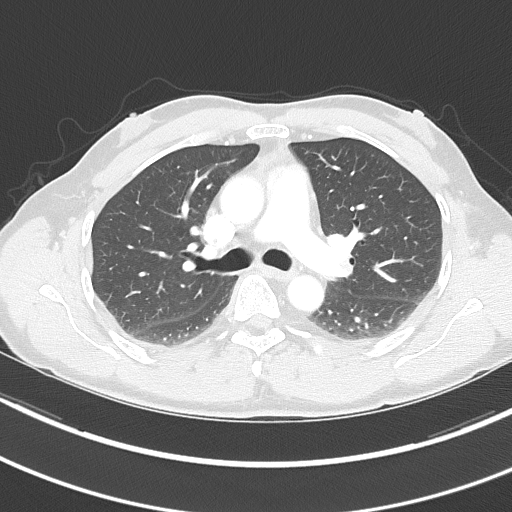
[im 123/154  lung]
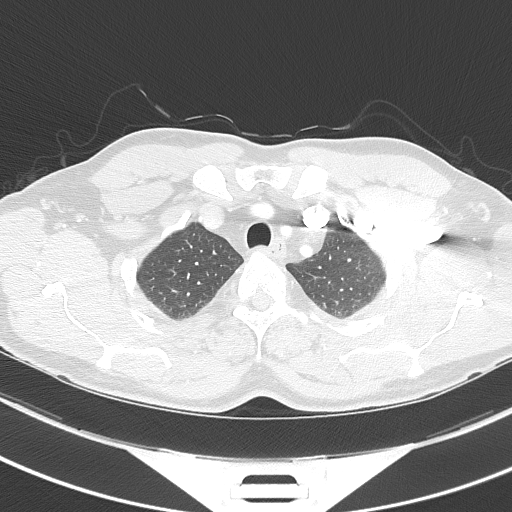

[Series 12: neck 2.00 br40 s3 st/ no angle · axial · 0.49mm/px · z∈[-767,-639]mm · 3 of 129 slices shown]
[im 33/129  lung]
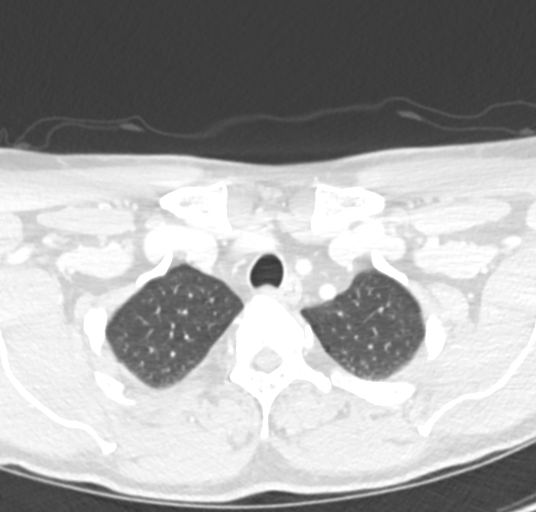
[im 65/129  lung]
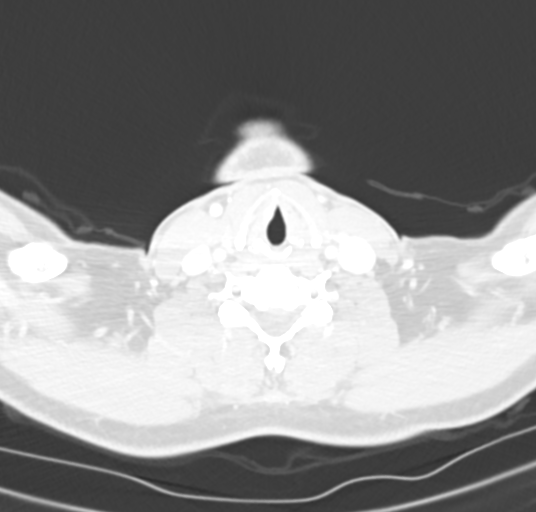
[im 97/129  lung]
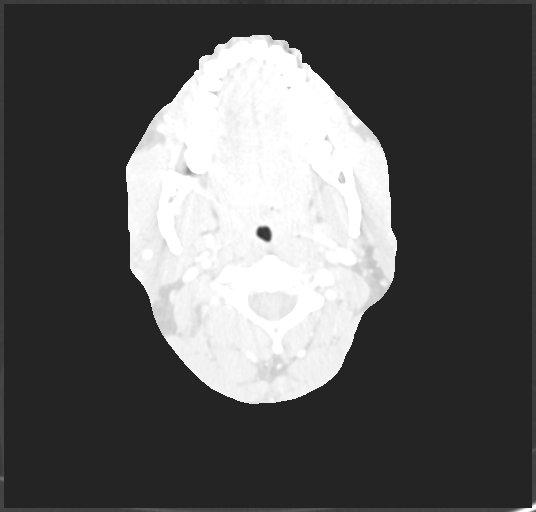

[Series 14: neck 2.00 br60 s3 bone/ no angle · axial · 0.49mm/px · z∈[-767,-639]mm · 3 of 129 slices shown]
[im 33/129  lung]
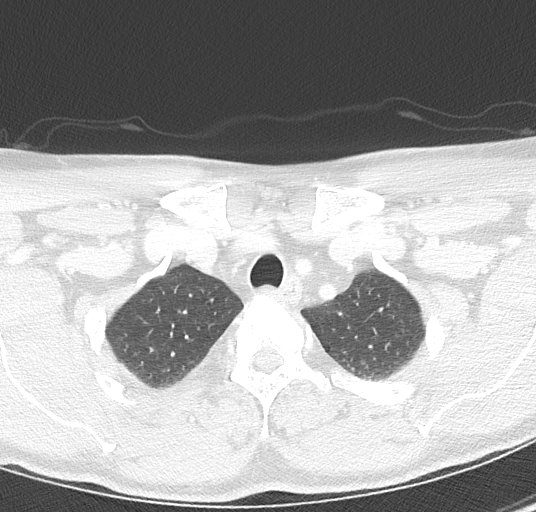
[im 65/129  lung]
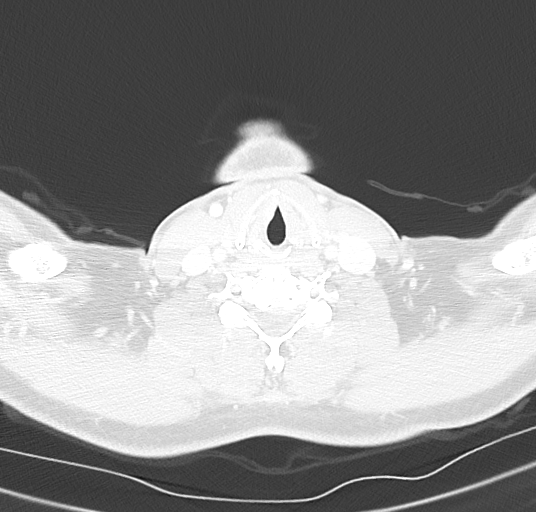
[im 97/129  lung]
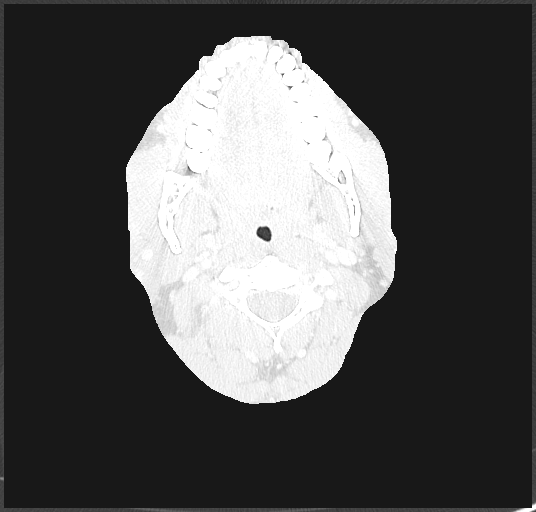

[Series 20: neck 2.00 br40 s3 (person_name) · coronal · 0.51mm/px · 1 of 109 slices shown]
[im 55/109  lung]
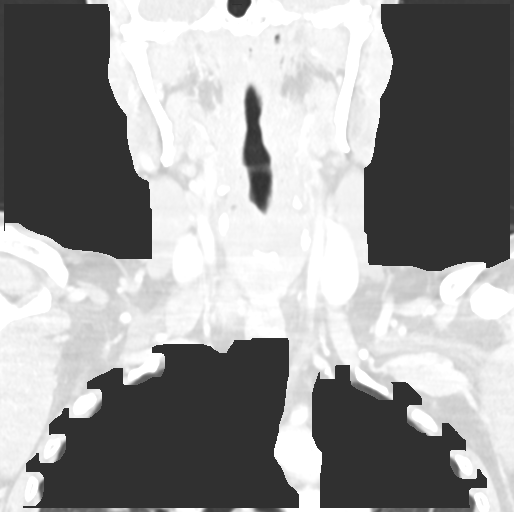

[14 of 36 positions shown; findings below may reference images not displayed]

FINDINGS: Cardiovascular: No significant vascular findings. Normal heart size.
No pericardial effusion.

Mediastinum/Nodes: No axillary or supraclavicular adenopathy. No
mediastinal hilar adenopathy. Esophagus normal. Small amount of
residual thymic tissue in the anterior mediastinum

Lungs/Pleura: No suspicious pulmonary nodules. Band of atelectasis
in the RIGHT middle lobe. Ground-glass densities at the lung bases
also favored atelectasis.

Upper Abdomen: Limited view of the liver, kidneys, pancreas are
unremarkable. Normal adrenal glands.

Musculoskeletal: No aggressive osseous lesion.
IMPRESSION: No pulmonary nodules or mediastinal adenopathy. No evidence of
thoracic metastasis.

Small amount residual thymus in the anterior mediastinum

## 2020-08-13 ENCOUNTER — Encounter (INDEPENDENT_AMBULATORY_CARE_PROVIDER_SITE_OTHER): Payer: Self-pay | Admitting: Otolaryngology

## 2020-08-13 ENCOUNTER — Other Ambulatory Visit: Payer: Self-pay

## 2020-08-13 ENCOUNTER — Ambulatory Visit (INDEPENDENT_AMBULATORY_CARE_PROVIDER_SITE_OTHER): Payer: PRIVATE HEALTH INSURANCE | Admitting: Otolaryngology

## 2020-08-13 VITALS — Temp 93.6°F

## 2020-08-13 DIAGNOSIS — Z4889 Encounter for other specified surgical aftercare: Secondary | ICD-10-CM

## 2020-08-13 NOTE — Progress Notes (Signed)
HPI: Juan Lewis is a 49 y.o. 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  Pathology report revealed only minimal squamous of carcinoma on the right side and just dysplasia on the left side. Patient has moderate hoarseness..   Past Medical History:  Diagnosis Date  . Hoarseness   . Lesion of vocal cord   . Prostate cancer [facility])    Past Surgical History:  Procedure Laterality Date  . MICROLARYNGOSCOPY N/A 03/23/2019   Procedure: MICROLARYNGOSCOPY WITH EXCISION OF VOCAL CORD LESION;  Surgeon: Rozetta Nunnery, MD;  Location: [place];  Service: ENT;  Laterality: N/A;  . MICROLARYNGOSCOPY N/A 07/15/2020   Procedure: DIAGNOSTIC LARYNGOSCOPY WITH MICROSCOPE AND BIOPSY;  Surgeon: Rozetta Nunnery, MD;  Location: [place];  Service: ENT;  Laterality: N/A;  . MICROLARYNGOSCOPY WITH CO2 LASER AND EXCISION OF VOCAL CORD LESION N/A 07/28/2020   Procedure: MICROLARYNGOSCOPY WITH CO2 LASER AND EXCISION OF VOCAL CORD LESION;  Surgeon: Rozetta Nunnery, MD;  Location: [place];  Service: ENT;  Laterality: N/A;  . PROSTATE BIOPSY     Social History   Socioeconomic History  . Marital status: Married    Spouse name: [name]   . Number of children: 4  . Years of education: Not on file  . Highest education level: Not on file  Occupational History  . Not on file  Tobacco Use  . Smoking status: Former Smoker    Packs/day: 0.50    Years: 30.00    Pack years: 15.00    Start date: 77    Quit date: 04/20/2019    Years since quitting: 1.3  . Smokeless tobacco: Never Used  . Tobacco comment: he quit when he started radiation. 06/15/19  Vaping Use  . Vaping Use: Never used  Substance and Sexual Activity  . Alcohol use: Not Currently    Comment: social  . Drug use: No  . Sexual activity: Not Currently  Other Topics Concern  . Not on file   Social History Narrative  . Not on file   Social Determinants of Health   Financial Resource Strain:   . Difficulty of Paying Living Expenses: Not on file  Food Insecurity:   . Worried About Charity fundraiser in the Last Year: Not on file  . Ran Out of Food in the Last Year: Not on file  Transportation Needs:   . Lack of Transportation (Medical): Not on file  . Lack of Transportation (Non-Medical): Not on file  Physical Activity:   . Days of Exercise per Week: Not on file  . Minutes of Exercise per Session: Not on file  Stress:   . Feeling of Stress : Not on file  Social Connections:   . Frequency of Communication with Friends and Family: Not on file  . Frequency of Social Gatherings with Friends and Family: Not on file  . Attends Religious Services: Not on file  . Active Member of Clubs or Organizations: Not on file  . Attends Archivist Meetings: Not on file  . Marital Status: Not on file   Family History  Problem Relation Age of Onset  . Prostate cancer Neg Hx   . Breast cancer Neg Hx   . Colon cancer Neg Hx   . Pancreatic cancer Neg Hx    No Known Allergies Prior to Admission medications   Medication Sig Start Date End  Date Taking? Authorizing Provider  omeprazole (PRILOSEC) 20 MG capsule Take 1 capsule (20 mg total) by mouth 2 (two) times daily. 07/28/20 07/28/21 Yes Rozetta Nunnery, MD     Physical Exam: Fiberoptic laryngoscopy was performed through the right nostril.  He has a defect of the anterior right true vocal cord which appears to be healing nicely.   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  Patient doing well.  Plan: He will follow-up in 5 weeks for recheck.   Radene Journey, MD

## 2020-09-15 ENCOUNTER — Ambulatory Visit (INDEPENDENT_AMBULATORY_CARE_PROVIDER_SITE_OTHER): Payer: Self-pay | Admitting: Otolaryngology

## 2020-09-17 ENCOUNTER — Other Ambulatory Visit: Payer: Self-pay

## 2020-09-17 ENCOUNTER — Ambulatory Visit (INDEPENDENT_AMBULATORY_CARE_PROVIDER_SITE_OTHER): Payer: PRIVATE HEALTH INSURANCE | Admitting: Otolaryngology

## 2020-09-17 VITALS — Temp 97.7°F

## 2020-09-17 DIAGNOSIS — Z4889 Encounter for other specified surgical aftercare: Secondary | ICD-10-CM

## 2020-09-17 NOTE — Progress Notes (Signed)
HPI: Juan Lewis is a 49 y.o. male who presents 2 months days s/p CO 2 laser excision of right vocal cord cancer following completion of radiation therapy.  He has moderate hoarseness with she is a little breathy but having no difficulty swallowing..   Past Medical History:  Diagnosis Date  . Hoarseness   . Lesion of vocal cord   . Prostate cancer [facility])    Past Surgical History:  Procedure Laterality Date  . MICROLARYNGOSCOPY N/A 03/23/2019   Procedure: MICROLARYNGOSCOPY WITH EXCISION OF VOCAL CORD LESION;  Surgeon: Rozetta Nunnery, MD;  Location: [place];  Service: ENT;  Laterality: N/A;  . MICROLARYNGOSCOPY N/A 07/15/2020   Procedure: DIAGNOSTIC LARYNGOSCOPY WITH MICROSCOPE AND BIOPSY;  Surgeon: Rozetta Nunnery, MD;  Location: [place];  Service: ENT;  Laterality: N/A;  . MICROLARYNGOSCOPY WITH CO2 LASER AND EXCISION OF VOCAL CORD LESION N/A 07/28/2020   Procedure: MICROLARYNGOSCOPY WITH CO2 LASER AND EXCISION OF VOCAL CORD LESION;  Surgeon: Rozetta Nunnery, MD;  Location: [place];  Service: ENT;  Laterality: N/A;  . PROSTATE BIOPSY     Social History   Socioeconomic History  . Marital status: Married    Spouse name: [name]   . Number of children: 4  . Years of education: Not on file  . Highest education level: Not on file  Occupational History  . Not on file  Tobacco Use  . Smoking status: Former Smoker    Packs/day: 0.50    Years: 30.00    Pack years: 15.00    Start date: 64    Quit date: 04/20/2019    Years since quitting: 1.4  . Smokeless tobacco: Never Used  . Tobacco comment: he quit when he started radiation. 06/15/19  Vaping Use  . Vaping Use: Never used  Substance and Sexual Activity  . Alcohol use: Not Currently    Comment: social  . Drug use: No  . Sexual activity: Not Currently  Other Topics Concern  . Not on file  Social History Narrative  . Not on file   Social Determinants of Health   Financial  Resource Strain:   . Difficulty of Paying Living Expenses: Not on file  Food Insecurity:   . Worried About Charity fundraiser in the Last Year: Not on file  . Ran Out of Food in the Last Year: Not on file  Transportation Needs:   . Lack of Transportation (Medical): Not on file  . Lack of Transportation (Non-Medical): Not on file  Physical Activity:   . Days of Exercise per Week: Not on file  . Minutes of Exercise per Session: Not on file  Stress:   . Feeling of Stress : Not on file  Social Connections:   . Frequency of Communication with Friends and Family: Not on file  . Frequency of Social Gatherings with Friends and Family: Not on file  . Attends Religious Services: Not on file  . Active Member of Clubs or Organizations: Not on file  . Attends Archivist Meetings: Not on file  . Marital Status: Not on file   Family History  Problem Relation Age of Onset  . Prostate cancer Neg Hx   . Breast cancer Neg Hx   . Colon cancer Neg Hx   . Pancreatic cancer Neg Hx    No Known Allergies Prior to Admission medications   Medication Sig Start Date End Date Taking? Authorizing Provider  omeprazole (PRILOSEC) 20 MG capsule Take  1 capsule (20 mg total) by mouth 2 (two) times daily. 07/28/20 07/28/21  Rozetta Nunnery, MD     Physical Exam: On fiberoptic laryngoscopy patient is status post excision of anterior right true vocal cord.  He seems to be doing well with minimal granulation tissue.  No evidence of persistent disease.   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  Plan: He will follow-up in 5 to 6 months for recheck.   Radene Journey, MD

## 2020-09-23 NOTE — Progress Notes (Signed)
  Radiation Oncology         ([phone]) [phone] ________________________________  Name: Juan Lewis MRN: [id number]  Date: 03/28/2020  DOB: March 16, 1971  End of Treatment Note  Diagnosis:   49 y.o. gentleman with Stage T1c adenocarcinoma of the prostate with Gleason score of 4+3, and PSA of 16.8.     Indication for treatment:  Curative, Definitive Radiotherapy       Radiation treatment dates:   5/11-6/18/21  Site/dose:   The prostate was treated to 70 Gy in 28 fractions of 2.5 Gy  Beams/energy:   The patient was treated with IMRT using volumetric arc therapy delivering 6 MV X-rays to clockwise and counterclockwise circumferential arcs with a 90 degree collimator offset to avoid dose scalloping.  Image guidance was performed with daily cone beam CT prior to each fraction to align to gold markers in the prostate and assure proper bladder and rectal fill volumes.  Immobilization was achieved with BodyFix custom mold.  Narrative: The patient tolerated radiation treatment relatively well.   The patient experienced some minor urinary irritation and modest fatigue.    Plan: The patient has completed radiation treatment. He will return to radiation oncology clinic for routine followup in one month. I advised him to call or return sooner if he has any questions or concerns related to his recovery or treatment. ________________________________  Sheral Apley. Tammi Klippel, M.D.

## 2020-12-15 ENCOUNTER — Encounter (INDEPENDENT_AMBULATORY_CARE_PROVIDER_SITE_OTHER): Payer: Self-pay

## 2020-12-15 NOTE — Progress Notes (Signed)
Letter for wife for FMLA for check up visits.

## 2021-01-13 ENCOUNTER — Telehealth: Payer: Self-pay | Admitting: *Deleted

## 2021-01-13 NOTE — Telephone Encounter (Signed)
CALLED PATIENT TO INFORM OF LAB ON 01-14-21 @ 10:45 AM, NO ANSWER

## 2021-01-14 ENCOUNTER — Telehealth: Payer: Self-pay | Admitting: *Deleted

## 2021-01-14 ENCOUNTER — Ambulatory Visit
Admission: RE | Admit: 2021-01-14 | Discharge: 2021-01-14 | Disposition: A | Discharge status: Home / Self Care | Payer: PRIVATE HEALTH INSURANCE | Source: Ambulatory Visit | Attending: Radiation Oncology | Admitting: Radiation Oncology

## 2021-01-14 ENCOUNTER — Other Ambulatory Visit: Payer: Self-pay

## 2021-01-14 ENCOUNTER — Inpatient Hospital Stay: Payer: PRIVATE HEALTH INSURANCE | Attending: Radiation Oncology

## 2021-01-14 VITALS — BP 119/92 | HR 60 | Temp 97.7°F | Resp 18 | Ht 68.0 in | Wt 167.0 lb

## 2021-01-14 DIAGNOSIS — Z23 Encounter for immunization: Secondary | ICD-10-CM

## 2021-01-14 DIAGNOSIS — Z923 Personal history of irradiation: Secondary | ICD-10-CM | POA: Diagnosis not present

## 2021-01-14 DIAGNOSIS — Z8521 Personal history of malignant neoplasm of larynx: Secondary | ICD-10-CM | POA: Diagnosis present

## 2021-01-14 DIAGNOSIS — Z1329 Encounter for screening for other suspected endocrine disorder: Secondary | ICD-10-CM

## 2021-01-14 DIAGNOSIS — C320 Malignant neoplasm of glottis: Secondary | ICD-10-CM

## 2021-01-14 LAB — TSH: TSH: 3.174 u[IU]/mL (ref 0.320–4.118)

## 2021-01-14 MED ORDER — OXYMETAZOLINE HCL 0.05 % NA SOLN
1.0000 | Freq: Once | NASAL | Status: AC
  Administered 2021-01-14: 1 via NASAL
  Filled 2021-01-14: qty 30

## 2021-01-14 NOTE — Telephone Encounter (Signed)
CALLED PATIENT TO INFORM OF LAB PRIOR TO FU TODAY, PATIENT TO HAVE LAB @ 10:45 AM THEN FU WITH DR. Isidore Moos @ 11:20 AM, SPOKE WITH PATIENT AND HE IS AWARE OF THESE APPTS.

## 2021-01-14 NOTE — Progress Notes (Signed)
Juan Lewis presents for follow up of radiation completed 06/01/2019 to his head and neck  Pain issues, if any: Reports occasional discomfort tightness to his throat when he yawns or stretches, but states it resolves on its own relatively quickly Using a feeding tube?: N/A Weight changes, if any: Wt Readings from Last 3 Encounters:  01/14/21 167 lb (75.8 kg)  07/28/20 162 lb 0.6 oz (73.5 kg)  07/15/20 160 lb 15 oz (73 kg)   Swallowing issues, if any: Patient denies any issues. Reports he can eat/drink anything he likes, and has a healthy appetite Smoking or chewing tobacco? None Using fluoride trays daily? N/A Last ENT visit was on:  07/28/2020 --Dr. Melony Lewis Microlaryngoscopy with CO2 laser excision of right anterior true vocal cord, anterior commissure and superficial left anterior true vocal cord. --FINAL MICROSCOPIC DIAGNOSIS:  A. VOCAL CORD, LEFT ANTERIOR, BIOPSY:  - Squamous mucosa with atypia concerning for low grade dysplasia - Negative for carcinoma  B. VOCAL CORD, RIGHT ANTERIOR BIOPSY, AND ANTERIOR COMMISSURE:  - Minute focus of invasive moderately differentiated squamous cell carcinoma  09/17/2020 Saw Dr. Melony Lewis:  "--Physical Exam: On fiberoptic laryngoscopy patient is status post excision of anterior right true vocal cord.  He seems to be doing well with minimal granulation tissue.  No evidence of persistent disease. 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 --Plan: He will follow-up in 5 to 6 months for recheck."  Other notable issues, if any: Reports having vocal hoarseness since most recent surgery by Dr. Lucia Lewis. Denies any ear/jaw pain, and states he is able to open his mouth fully, but sometimes experiences muscle tension. Denies lingering dry mouth or thick saliva. Denies any symptoms of swelling or lymphedema to jaw/neck. Reports he's sleeping well and denies any issues with fatigue.   Vitals:    01/14/21 1119  BP: (!) 119/92  Pulse: 60  Resp: 18  Temp: 97.7 F (36.5 C)  SpO2: 100%

## 2021-01-14 NOTE — Progress Notes (Signed)
Radiation Oncology         [phone]) [phone] ________________________________  Name: Juan Lewis MRN: [id number]  Date: 01/14/2021  DOB: 10-May-1971  Follow-Up Visit Note, in person, outpatient   CC: Default, Provider, MD  Rozetta Nunnery, *  Diagnosis and Prior Radiotherapy:       ICD-10-CM   1. Screening for hypothyroidism  Z13.29 TSH  2. 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 oxymetazoline (AFRIN) 0.05 % nasal spray 1 spray    Fiberoptic laryngoscopy    Radiation Treatment Dates: 04/23/2019 through 05/31/2019 Site Technique Total Dose Dose per Fx Completed Fx Beam Energies  Head & neck: HN_larynx 3D 65.25/65.25 2.25 29/29 6X    CHIEF COMPLAINT:  Here for follow-up and surveillance of glottic cancer  Narrative:    Juan Lewis presents for follow up of radiation completed 06/01/2019 to his head and neck  Pain issues, if any: Reports occasional discomfort tightness to his throat when he yawns or stretches, but states it resolves on its own relatively quickly Using a feeding tube?: N/A Weight changes, if any: Wt Readings from Last 3 Encounters:  01/14/21 167 lb (75.8 kg)  07/28/20 162 lb 0.6 oz (73.5 kg)  07/15/20 160 lb 15 oz (73 kg)   Swallowing issues, if any: Patient denies any issues. Reports he can eat/drink anything he likes, and has a healthy appetite Smoking or chewing tobacco? None Using fluoride trays daily? N/A Last ENT visit was on:  07/28/2020 --Dr. Melony Overly Microlaryngoscopy with CO2 laser excision of right anterior true vocal cord, anterior commissure and superficial left anterior true vocal cord. --FINAL MICROSCOPIC DIAGNOSIS:  A. VOCAL CORD, LEFT ANTERIOR, BIOPSY:  - Squamous mucosa with atypia concerning for low grade dysplasia - Negative for carcinoma  B. VOCAL CORD, RIGHT ANTERIOR BIOPSY, AND ANTERIOR COMMISSURE:  - Minute focus of invasive moderately differentiated squamous cell carcinoma  09/17/2020 Saw Dr. Melony Overly:   "--Physical Exam: On fiberoptic laryngoscopy patient is status post excision of anterior right true vocal cord.  He seems to be doing well with minimal granulation tissue.  No evidence of persistent disease. 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 --Plan: He will follow-up in 5 to 6 months for recheck."  Other notable issues, if any: Reports having vocal hoarseness since most recent surgery by Dr. Lucia Gaskins. Denies any ear/jaw pain, and states he is able to open his mouth fully, but sometimes experiences muscle tension. Denies lingering dry mouth or thick saliva. Denies any symptoms of swelling or lymphedema to jaw/neck. Reports he's sleeping well and denies any issues with fatigue.   Vitals:   01/14/21 1119  BP: (!) 119/92  Pulse: 60  Resp: 18  Temp: 97.7 F (36.5 C)  SpO2: 100%     Other notable issues, if any:  Treated for prostate cancer (02/19/2020-03/28/2020) by Dr. Tyler Pita: 70Gy in 14fractions of 2.5 Gy each    ALLERGIES:  has No Known Allergies.  Meds: Current Outpatient Medications  Medication Sig Dispense Refill  . tamsulosin (FLOMAX) 0.4 MG CAPS capsule Take 0.4 mg by mouth at bedtime.     No current facility-administered medications for this encounter.    Physical Findings: The patient is in no acute distress. Patient is alert and oriented. Wt Readings from Last 3 Encounters:  01/14/21 167 lb (75.8 kg)  07/28/20 162 lb 0.6 oz (73.5 kg)  07/15/20 160 lb 15 oz (73 kg)    height is 5\' 8"  (1.727 m) and  weight is 167 lb (75.8 kg). His temperature is 97.7 F (36.5 C). His blood pressure is 119/92 (abnormal) and his pulse is 60. His respiration is 18 and oxygen saturation is 100%. .  General: Alert and oriented, in no acute distress HEENT: Head is normocephalic.  Oral cavity and oropharynx  notable for no lesions Neck: No palpable adenopathy Chest clear to auscultation bilaterally Heart regular in rate  and rhythm Skin: Satisfactory healing of skin of neck  Psychiatric: Judgment and insight are intact. Affect is appropriate.  PROCEDURE NOTE:  PROCEDURE NOTE: After obtaining consent and anesthetizing the nasal cavity with topical oxymetolazine, the flexible endoscope was introduced and passed through the nasal cavity.  The nasopharynx, oropharynx, larynx, and hypopharynx were then examined. No lesions appreciated in nasopharynx, oropharynx, hypopharynx, or larynx. True cords are without lesions or paralysis.    Lab Findings: Lab Results  Component Value Date   WBC 3.1 (L) 07/28/2020   HGB 13.3 07/28/2020   HCT 41.7 07/28/2020   MCV 93.3 07/28/2020   PLT 234 07/28/2020    Lab Results  Component Value Date   TSH 3.174 01/14/2021    Radiographic Findings: No results found.  Impression/Plan:    1) Head and Neck Cancer Status: NED  2) Nutritional Status: No issues  3) Risk Factors: The patient has been educated about risk factors including alcohol and tobacco abuse; they understand that avoidance of alcohol and tobacco is important to prevent recurrences as well as other cancers - not smoking.  4) Swallowing: No issues  5)  Thyroid function:  Lab Results  Component Value Date   TSH 3.174 01/14/2021  Within normal limits  6) F/u closely w/ ENT, see me back in 4-39mo.  7) We discussed measures to reduce the risk of infection during the COVID-19 pandemic.  He is due for his booster and would like to receive it - we will give it today.  On date of service, in total, I spent 25 minutes on this encounter. Patient was seen in person.  _____________________________________   Eppie Gibson, MD

## 2021-01-14 NOTE — Progress Notes (Signed)
   Covid-19 Vaccination Clinic  Name:  Juan Lewis    MRN: [id number] DOB: Dec 06, 1970  01/14/2021  [name] was observed post Covid-19 immunization for 15 minutes without incident. He was provided with Vaccine Information Sheet and instruction to access the V-Safe system.   [name] was instructed to call 911 with any severe reactions post vaccine: [name] Difficulty breathing  . Swelling of face and throat  . A fast heartbeat  . A bad rash all over body  . Dizziness and weakness   Immunizations Administered    Name Date Dose VIS Date Route   PFIZER Comrnaty(Gray TOP) Covid-19 Vaccine 01/14/2021 12:55 PM 0.3 mL 09/18/2020 Intramuscular   Manufacturer: [employer]   Lot: [id number]   NDC: [phone]

## 2021-01-15 ENCOUNTER — Encounter: Payer: Self-pay | Admitting: Radiation Oncology

## 2021-01-26 ENCOUNTER — Telehealth: Payer: Self-pay | Admitting: *Deleted

## 2021-01-26 ENCOUNTER — Encounter: Payer: Self-pay | Admitting: *Deleted

## 2021-01-26 NOTE — Telephone Encounter (Signed)
Reviewed RN Hildred Alamin note agreed with plan of care.  Will follow up with patient via telephone tomorrow.

## 2021-01-26 NOTE — Telephone Encounter (Signed)
RN notified by HR that pt called out of work citing stomach issues. RN spoke with pt by phone. He reports stomach pain this morning. Denies n/v/d. Only generalized abd pain and some pain in lower back. Denies Hx of kidney stones. Denies dysuria. He reports he is able to tolerate po solids and fluids. Just ate lunch.  Plan for pcp f/u if pain persists and RTW tomorrow 4/19 as long as no new or worsening sx. Pt verbalizes understanding and agreement.

## 2021-01-27 NOTE — Telephone Encounter (Signed)
Spoke with pt by phone. All sx resolved. He is at work today. Closing encounter.

## 2021-01-27 NOTE — Telephone Encounter (Signed)
Reviewed RN Hildred Alamin note all symptoms resolved and patient returned to work.

## 2021-01-30 ENCOUNTER — Ambulatory Visit: Payer: Self-pay | Admitting: *Deleted

## 2021-01-30 ENCOUNTER — Other Ambulatory Visit: Payer: Self-pay

## 2021-01-30 VITALS — BP 112/77 | HR 66 | Ht 68.0 in | Wt 164.0 lb

## 2021-01-30 DIAGNOSIS — Z0000 Encounter for general adult medical examination without abnormal findings: Secondary | ICD-10-CM

## 2021-01-30 NOTE — Progress Notes (Signed)
[employer] insurance premium discount evaluation: Labs Drawn. Replacements ROI form signed. Tobacco Free Attestation form signed.  Forms placed in paper chart.  

## 2021-01-31 LAB — CMP12+LP+TP+TSH+6AC+PSA+CBC…
ALT: 22 IU/L (ref 0–44)
AST: 20 IU/L (ref 0–40)
Albumin/Globulin Ratio: 1.8 (ref 1.2–2.2)
Albumin: 4.7 g/dL (ref 3.8–4.9)
Alkaline Phosphatase: 43 IU/L — ABNORMAL LOW (ref 44–121)
BUN/Creatinine Ratio: 15 (ref 9–20)
BUN: 14 mg/dL (ref 6–24)
Basophils Absolute: 0 10*3/uL (ref 0.0–0.2)
Basos: 0 %
Bilirubin Total: 0.3 mg/dL (ref 0.0–1.2)
Calcium: 9.7 mg/dL (ref 8.7–10.2)
Chloride: 106 mmol/L (ref 96–106)
Chol/HDL Ratio: 3.6 ratio (ref 0.0–5.0)
Cholesterol, Total: 165 mg/dL (ref 100–199)
Creatinine, Ser: 0.94 mg/dL (ref 0.76–1.27)
EOS (ABSOLUTE): 0.1 10*3/uL (ref 0.0–0.4)
Eos: 2 %
Estimated CHD Risk: 0.6 times avg. (ref 0.0–1.0)
Free Thyroxine Index: 1.3 (ref 1.2–4.9)
GGT: 39 IU/L (ref 0–65)
Globulin, Total: 2.6 g/dL (ref 1.5–4.5)
Glucose: 86 mg/dL (ref 65–99)
HDL: 46 mg/dL (ref >39)
Hematocrit: 39.3 % (ref 37.5–51.0)
Hemoglobin: 13 g/dL (ref 13.0–17.7)
Immature Grans (Abs): 0 10*3/uL (ref 0.0–0.1)
Immature Granulocytes: 0 %
Iron: 106 ug/dL (ref 38–169)
LDH: 196 IU/L (ref 121–224)
LDL Chol Calc (NIH): 110 mg/dL — ABNORMAL HIGH (ref 0–99)
Lymphocytes Absolute: 1.4 10*3/uL (ref 0.7–3.1)
Lymphs: 49 %
MCH: 29.5 pg (ref 26.6–33.0)
MCHC: 33.1 g/dL (ref 31.5–35.7)
MCV: 89 fL (ref 79–97)
Monocytes Absolute: 0.4 10*3/uL (ref 0.1–0.9)
Monocytes: 12 %
Neutrophils Absolute: 1.1 10*3/uL — ABNORMAL LOW (ref 1.4–7.0)
Neutrophils: 37 %
Phosphorus: 3.1 mg/dL (ref 2.8–4.1)
Platelets: 264 10*3/uL (ref 150–450)
Potassium: 4.4 mmol/L (ref 3.5–5.2)
Prostate Specific Ag, Serum: 1.7 ng/mL (ref 0.0–4.0)
RBC: 4.41 x10E6/uL (ref 4.14–5.80)
RDW: 13.2 % (ref 11.6–15.4)
Sodium: 143 mmol/L (ref 134–144)
T3 Uptake Ratio: 23 % — ABNORMAL LOW (ref 24–39)
T4, Total: 5.8 ug/dL (ref 4.5–12.0)
TSH: 1.65 u[IU]/mL (ref 0.450–4.500)
Total Protein: 7.3 g/dL (ref 6.0–8.5)
Triglycerides: 43 mg/dL (ref 0–149)
Uric Acid: 7.1 mg/dL (ref 3.8–8.4)
VLDL Cholesterol Cal: 9 mg/dL (ref 5–40)
WBC: 2.9 10*3/uL — ABNORMAL LOW (ref 3.4–10.8)
eGFR: 98 mL/min/{1.73_m2} (ref >59)

## 2021-01-31 LAB — HGB A1C W/O EAG: Hgb A1c MFr Bld: 5.9 % — ABNORMAL HIGH (ref 4.8–5.6)

## 2021-02-01 ENCOUNTER — Encounter: Payer: Self-pay | Admitting: Registered Nurse

## 2021-02-01 DIAGNOSIS — Z6824 Body mass index (BMI) 24.0-24.9, adult: Secondary | ICD-10-CM

## 2021-02-01 DIAGNOSIS — R7303 Prediabetes: Secondary | ICD-10-CM

## 2021-02-02 NOTE — Progress Notes (Signed)
Results reviewed with pt in clinic. Reviewed NP notes above. Diet and exercise recommendations discussed re: A1c/prediabetes, cholesterol. Handouts provided. Routine f/u with pcp. Pt denies having pcp but does still see oncologist at least once yearly that he reports is a "private doctor"/not with a health system and can't recall the name at this time. Copy of labs provided to pt and advised that he provide to MD at next appt. Results routed to pcp per pt request. No further questions/concerns.

## 2021-02-02 NOTE — Progress Notes (Signed)
Noted agreed with plan of care and follow up with his oncologist.  I do recommend establishing with a PCM.

## 2021-02-17 NOTE — Telephone Encounter (Signed)
Patient does want to see dietitian for prediabetes.  Referral placed for [facility] provider.  Patient verbalized understanding information/instructions, agreed with plan of care and had no further questions at this time.

## 2021-02-18 ENCOUNTER — Encounter (INDEPENDENT_AMBULATORY_CARE_PROVIDER_SITE_OTHER): Payer: Self-pay | Admitting: Otolaryngology

## 2021-02-18 ENCOUNTER — Ambulatory Visit (INDEPENDENT_AMBULATORY_CARE_PROVIDER_SITE_OTHER): Payer: PRIVATE HEALTH INSURANCE | Admitting: Otolaryngology

## 2021-02-18 ENCOUNTER — Other Ambulatory Visit: Payer: Self-pay

## 2021-02-18 VITALS — Temp 97.3°F

## 2021-02-18 DIAGNOSIS — R490 Dysphonia: Secondary | ICD-10-CM | POA: Diagnosis not present

## 2021-02-18 DIAGNOSIS — Z8521 Personal history of malignant neoplasm of larynx: Secondary | ICD-10-CM | POA: Diagnosis not present

## 2021-02-18 NOTE — Progress Notes (Signed)
HPI: Juan Lewis is a 50 y.o. 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  This was initially diagnosed in June 2020.  He underwent primary treatment with radiation therapy.  However a year later he had persistent or recurrent cancer involving the anterior right true vocal cord.  He was subsequently taken to the operating room for laser excision of right anterior true vocal cord and anterior commissure in October 2021.  He has had chronic hoarseness since his surgery with mild breathy voice.  He has not noted any significant change in his voice over the last several months.  He returns today for recheck..  Past Medical History:  Diagnosis Date  . Hoarseness   . Lesion of vocal cord   . Prostate cancer [facility])    Past Surgical History:  Procedure Laterality Date  . MICROLARYNGOSCOPY N/A 03/23/2019   Procedure: MICROLARYNGOSCOPY WITH EXCISION OF VOCAL CORD LESION;  Surgeon: Rozetta Nunnery, MD;  Location: [place];  Service: ENT;  Laterality: N/A;  . MICROLARYNGOSCOPY N/A 07/15/2020   Procedure: DIAGNOSTIC LARYNGOSCOPY WITH MICROSCOPE AND BIOPSY;  Surgeon: Rozetta Nunnery, MD;  Location: [place];  Service: ENT;  Laterality: N/A;  . MICROLARYNGOSCOPY WITH CO2 LASER AND EXCISION OF VOCAL CORD LESION N/A 07/28/2020   Procedure: MICROLARYNGOSCOPY WITH CO2 LASER AND EXCISION OF VOCAL CORD LESION;  Surgeon: Rozetta Nunnery, MD;  Location: [place];  Service: ENT;  Laterality: N/A;  . PROSTATE BIOPSY     Social History   Socioeconomic History  . Marital status: Married    Spouse name: [name]   . Number of children: 4  . Years of education: Not on file  . Highest education level: Not on file  Occupational History  . Not on file  Tobacco Use  . Smoking status: Former Smoker    Packs/day: 0.50    Years: 30.00    Pack years: 15.00    Start date: 55    Quit date: 04/20/2019    Years since quitting: 1.8  .  Smokeless tobacco: Never Used  . Tobacco comment: he quit when he started radiation. 06/15/19  Vaping Use  . Vaping Use: Never used  Substance and Sexual Activity  . Alcohol use: Not Currently    Comment: social  . Drug use: No  . Sexual activity: Not Currently  Other Topics Concern  . Not on file  Social History Narrative  . Not on file   Social Determinants of Health   Financial Resource Strain: Not on file  Food Insecurity: Not on file  Transportation Needs: Not on file  Physical Activity: Not on file  Stress: Not on file  Social Connections: Not on file   Family History  Problem Relation Age of Onset  . Prostate cancer Neg Hx   . Breast cancer Neg Hx   . Colon cancer Neg Hx   . Pancreatic cancer Neg Hx    No Known Allergies Prior to Admission medications   Medication Sig Start Date End Date Taking? Authorizing Provider  tamsulosin (FLOMAX) 0.4 MG CAPS capsule Take 0.4 mg by mouth at bedtime. Patient not taking: Reported on 01/30/2021 10/31/20   [provider]     Positive ROS: Otherwise negative  All other systems have been reviewed and were otherwise negative with the exception of those mentioned in the HPI and as above.  Physical Exam: Constitutional: Alert, well-appearing, no acute distress Ears: External ears without lesions or tenderness.  Ear canals are clear bilaterally with intact, clear TMs.  Nasal: External nose without lesions. Septum relatively midline.. Clear nasal passages Oral: Lips and gums without lesions. Tongue and palate mucosa without lesions. Posterior oropharynx clear. Fiberoptic laryngoscopy was performed through the left nostril.  The nasopharynx was clear.  The base of tongue was clear.  The epiglottis was normal.  On evaluation of vocal cords both cords had symmetric mobility.  No specific vocal cord lesions were noted.  No evidence of persistent or recurrent cancer noted on fiberoptic laryngoscopy. Neck: No palpable adenopathy or  masses.  No palpable adenopathy along the trachea or anterior jugular chain of lymph nodes or supraclavicular area. Respiratory: Breathing comfortably  Skin: No facial/neck lesions or rash noted.  Laryngoscopy  Date/Time: 02/18/2021 1:07 PM Performed by: Rozetta Nunnery, MD Authorized by: Rozetta Nunnery, MD   Consent:    Consent obtained:  Verbal   Consent given by:  Patient Procedure details:    Indications: hoarseness, dysphagia, or aspiration and oncologic surveillance follow-up     Medication:  Afrin   Instrument: flexible fiberoptic laryngoscope     Scope location: left nare   Sinus:    Left nasopharynx: normal   Mouth:    Oropharynx: normal     Vallecula: normal     Base of tongue: normal     Epiglottis: normal   Throat:    True vocal cords: normal   Comments:     On fiberoptic laryngoscopy patient with a defect of the anterior true vocal cords bilaterally especially on the right side.  But no evidence of any recurrent cancer.    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  He completed radiation therapy with persistent disease noted last year and separately underwent wide CO2 laser excision of the cancer in October of last year. No evidence of any recurrent cancer on laryngoscopy.  Plan: He will follow-up in 4 to 5 months for recheck.   Radene Journey, MD

## 2021-03-26 ENCOUNTER — Encounter: Payer: PRIVATE HEALTH INSURANCE | Attending: Registered Nurse | Admitting: Registered"

## 2021-03-26 ENCOUNTER — Other Ambulatory Visit: Payer: Self-pay

## 2021-03-26 ENCOUNTER — Encounter: Payer: Self-pay | Admitting: Registered"

## 2021-03-26 DIAGNOSIS — Z713 Dietary counseling and surveillance: Secondary | ICD-10-CM | POA: Insufficient documentation

## 2021-03-26 NOTE — Patient Instructions (Addendum)
-   Aim to eat 3 meals a day, balanced with 1/2 plate of non-starchy vegetables, 1/4 plate of lean protein, and 1/4 plate of carbohydrates.   - Balance snacks with source of carbohydrates + lean protein such as: Fruit and nuts Fruit and peanut butter Cheese and crackers Peanut butter and crackers  - Aim to increase physical activity to 5 days a week of least 30 minutes each time.

## 2021-03-26 NOTE — Progress Notes (Signed)
Medical Nutrition Therapy  Appointment Start time:  11:10  Appointment End time:  11:56  Primary concerns today: what to eat to help with prediabetes  Referral diagnosis: prediabetes Preferred learning style: no preference indicated Learning readiness: ready, change in progress   NUTRITION ASSESSMENT   States he exercises during his lunch break. Pt states he does not like to eat too much food. States he is not picky and likes a lot of vegetables. Reports he likes to eat some foods from his country and wants to make sure they are ok.   States he will be starting to work nights next week, 7p-7a. 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 [employer] Hx: prediabetes, prostate cancer Medications: none Labs: elevated A1C (5.9) Notable Signs/Symptoms: none reported  Lifestyle & Dietary Hx  Estimated daily fluid intake: ~48 oz Supplements: See list Sleep: 7-8 hrs/night Stress / self-care: drinks 1/-2 glasses of ed wine  Current average weekly physical activity: elliptical 20-25 min, 3-4x/week  24-Hr Dietary Recall First Meal: bread or eggs or coffee (with milk) Snack:  Second Meal: typically skips Snack:  Third Meal (7:30 pm): canned fish + fried pork + porridge (fufu) Snack:  Beverages: red wine, coffee, water (3*16 oz; 48 oz), liquor (2 shots)  NUTRITION DIAGNOSIS  NB-1.1 Food and nutrition-related knowledge deficit As related to prediabetes.  As evidenced by lack of previous nutrition-related education.   NUTRITION INTERVENTION  Nutrition education (E-1) on the following topics:  Pt was educated and counseled on how carbohydrates work in the body, role of fiber in eating regimen, and importance of physical activity. Discussed meal/snack planning and how to balance meals/snacks using MyPlate for prediabetes. Discussed how to incorporate cultural items into eating regimen for prediabetes. Pt agreed with goals listed.  Handouts Provided Include  My  Plate for Prediabetes  Learning Style & Readiness for Change Teaching method utilized: Visual & Auditory  Demonstrated degree of understanding via: Teach Back  Barriers to learning/adherence to lifestyle change: none identified  Goals Established by Pt Aim to eat 3 meals a day, balanced with 1/2 plate of non-starchy vegetables, 1/4 plate of lean protein, and 1/4 plate of carbohydrates.  Balance snacks with source of carbohydrates + lean protein such as: Fruit and nuts Fruit and peanut butter Cheese and crackers Peanut butter and crackers Aim to increase physical activity to 5 days a week of least 30 minutes each time.    MONITORING & EVALUATION Dietary intake, weekly physical activity.  Next Steps  Patient is to follow-up prn.

## 2021-03-26 NOTE — Progress Notes (Signed)
Reviewed dietitian note for prediabetes education and will reinforce plan with patient at follow up encounters.

## 2021-06-04 NOTE — Progress Notes (Addendum)
Juan Lewis presents for follow up of radiation completed 06/01/2019 to his head and neck   Pain issues, if any: Reports occasional discomfort/spasm feeling to the right side of his neck with stretching, but states it resolves on its own (and doesn't occur often) Using a feeding tube?: N/A Weight changes, if any: Reports a healthy appetite Wt Readings from Last 3 Encounters:  06/05/21 168 lb (76.2 kg)  01/30/21 164 lb (74.4 kg)  01/14/21 167 lb (75.8 kg)   Swallowing issues, if any: Patient denies. Reports he can eat/drink a wide variety Smoking or chewing tobacco? None Using fluoride trays daily? N/A--currently does not see a community dentist regularly, but denies any oral/dental concerns Last ENT visit was on: 02/18/2021 Saw Dr. Melony Overly:  "Comments:  --On fiberoptic laryngoscopy patient with a defect of the anterior true vocal cords bilaterally especially on the right side.  But no evidence of any recurrent cancer. Assessment --He completed radiation therapy with persistent disease noted last year and separately underwent wide CO2 laser excision of the cancer in October of last year. No evidence of any recurrent cancer on laryngoscopy. Plan: --He will follow-up in 4 to 5 months for recheck."  Other notable issues, if any: Continues to deal with vocal hoarseness. Denies any ear or jaw pain, or difficulty opening his mouth. Denies issues with dry mouth or thick saliva. Denies lingering fatigue and reports he's sleeping well. Continues F/U with his urologist (Dr. Link Snuffer every 6 months). Denies any signs or symptoms of swelling/lymphedema to his chin or neck

## 2021-06-05 ENCOUNTER — Encounter: Payer: Self-pay | Admitting: Radiation Oncology

## 2021-06-05 ENCOUNTER — Ambulatory Visit
Admission: RE | Admit: 2021-06-05 | Discharge: 2021-06-05 | Disposition: A | Discharge status: Home / Self Care | Payer: No Typology Code available for payment source | Source: Ambulatory Visit | Attending: Radiation Oncology | Admitting: Radiation Oncology

## 2021-06-05 ENCOUNTER — Other Ambulatory Visit: Payer: Self-pay

## 2021-06-05 VITALS — BP 125/80 | HR 66 | Temp 97.4°F | Resp 18 | Ht 68.0 in | Wt 168.0 lb

## 2021-06-05 DIAGNOSIS — R490 Dysphonia: Secondary | ICD-10-CM | POA: Insufficient documentation

## 2021-06-05 DIAGNOSIS — C320 Malignant neoplasm of glottis: Secondary | ICD-10-CM | POA: Diagnosis present

## 2021-06-05 DIAGNOSIS — Z923 Personal history of irradiation: Secondary | ICD-10-CM | POA: Insufficient documentation

## 2021-06-05 MED ORDER — OXYMETAZOLINE HCL 0.05 % NA SOLN
2.0000 | Freq: Once | NASAL | Status: AC
  Administered 2021-06-05: 2 via NASAL
  Filled 2021-06-05: qty 30

## 2021-06-05 NOTE — Progress Notes (Signed)
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 Melony Overly Microlaryngoscopy with CO2 laser excision of right anterior true vocal cord, anterior commissure and superficial left anterior true vocal cord. --FINAL MICROSCOPIC DIAGNOSIS:  A. VOCAL CORD, LEFT ANTERIOR, BIOPSY:  - Squamous mucosa with atypia concerning for low grade dysplasia - Negative for carcinoma  B. VOCAL CORD, RIGHT ANTERIOR BIOPSY, AND ANTERIOR COMMISSURE:  - Minute focus of invasive moderately differentiated squamous cell carcinoma     CHIEF COMPLAINT:  Here for follow-up and surveillance of glottic cancer  Narrative:   Juan Lewis presents for follow up of radiation completed 06/01/2019 to his head and neck   Pain issues, if any: Reports occasional discomfort/spasm feeling to the right side of his neck with stretching, but states it resolves on its own (and doesn't occur often) Using a feeding tube?: N/A Weight changes, if any: Reports a healthy appetite Wt Readings from Last 3 Encounters:  06/05/21 168 lb (76.2 kg)  01/30/21 164 lb (74.4 kg)  01/14/21 167 lb (75.8 kg)   Swallowing issues, if any: Patient denies. Reports he can eat/drink a wide variety Smoking or chewing tobacco? None Using fluoride trays daily? N/A--currently does not see a community dentist regularly, but denies any oral/dental concerns Last ENT  visit was on: 02/18/2021 Saw Dr. Melony Overly:  "Comments:  --On fiberoptic laryngoscopy patient with a defect of the anterior true vocal cords bilaterally especially on the right side.  But no evidence of any recurrent cancer. Assessment --He completed radiation therapy with persistent disease noted last year and separately underwent wide CO2 laser excision of the cancer in October of last year. No evidence of any recurrent cancer on laryngoscopy. Plan: --He will follow-up in 4 to 5 months for recheck."  Other notable issues, if any: Continues to deal with vocal hoarseness. Denies any ear or jaw pain, or difficulty opening his mouth. Denies issues with dry mouth or thick saliva. Denies lingering fatigue and reports he's sleeping well. Continues F/U with his urologist (Dr. Link Snuffer every 6 months). Denies any signs or symptoms of swelling/lymphedema to his chin or neck   ALLERGIES:  has No Known Allergies.  Meds: Current Outpatient Medications  Medication Sig Dispense Refill   tamsulosin (FLOMAX) 0.4 MG CAPS capsule Take 0.4 mg by mouth at bedtime. (Patient not taking: Reported on 01/30/2021)     No current facility-administered medications for this encounter.    Physical Findings: The patient is in no acute distress. Patient is alert and oriented. Wt Readings from Last 3 Encounters:  06/05/21 168 lb (76.2 kg)  01/30/21 164 lb (74.4 kg)  01/14/21 167 lb (75.8 kg)    height is '5\' 8"'$  (1.727 m) and weight is 168 lb (76.2 kg). His oral temperature is 97.4 F (36.3 C) (abnormal). His blood pressure is 125/80 and his pulse is 66. His respiration is  18 and oxygen saturation is 97%. .  General: Alert and oriented, in no acute distress HEENT: Head is normocephalic.  Oral cavity and oropharynx  notable for no lesions Neck: No palpable adenopathy Chest clear to auscultation bilaterally Heart regular in rate and rhythm Abdomen soft and nontender Skin: Satisfactory healing of skin of  neck skin with residual hyperpigmentation Psychiatric: Judgment and insight are intact. Affect is appropriate.  PROCEDURE NOTE:  PROCEDURE NOTE: After obtaining consent and anesthetizing the nasal cavity with topical oxymetolazine, the flexible endoscope was introduced and passed through the nasal cavity.  The nasopharynx, oropharynx, larynx, and hypopharynx were then examined. No lesions appreciated in nasopharynx, oropharynx, hypopharynx, or larynx. True cords are without lesions or paralysis.  Tissue deficit noted at anterior right true cord from previous biopsy.   Lab Findings: Lab Results  Component Value Date   WBC 2.9 (L) 01/30/2021   HGB 13.0 01/30/2021   HCT 39.3 01/30/2021   MCV 89 01/30/2021   PLT 264 01/30/2021    Lab Results  Component Value Date   TSH 1.650 01/30/2021    Radiographic Findings: No results found.  Impression/Plan:    1) Head and Neck Cancer Status: NED  2) Nutritional Status: No issues  3) Risk Factors: The patient has been educated about risk factors including alcohol and tobacco abuse; they understand that avoidance of alcohol and tobacco is important to prevent recurrences as well as other cancers - not smoking.  4) Swallowing: No issues  5)  Thyroid function:  Lab Results  Component Value Date   TSH 1.650 01/30/2021  Within normal limits, check annually  6) F/u  w/ ENT (navigator will notify office to schedule), see me back in 54mo     On date of service, in total, I spent 30 minutes on this encounter. Patient was seen in person.  _____________________________________   SEppie Gibson MD

## 2021-06-08 NOTE — Progress Notes (Signed)
Oncology Nurse Navigator Documentation   At Dr. Pearlie Oyster request I contacted Dr. Pollie Friar office to request a follow up appointment in late October before Dr. Ruthann Cancer. I spoke with Gay Filler and she verbalized that she would call Juan Lewis to set up an appointment.   Harlow Asa RN, BSN, OCN Head & Neck Oncology Nurse [place] [facility] Phone # [phone]  Fax # [phone]

## 2021-06-09 DIAGNOSIS — J383 Other diseases of vocal cords: Secondary | ICD-10-CM | POA: Insufficient documentation

## 2021-06-09 DIAGNOSIS — D709 Neutropenia, unspecified: Secondary | ICD-10-CM | POA: Insufficient documentation

## 2021-06-09 DIAGNOSIS — R490 Dysphonia: Secondary | ICD-10-CM | POA: Insufficient documentation

## 2021-06-09 DIAGNOSIS — R7303 Prediabetes: Secondary | ICD-10-CM | POA: Insufficient documentation

## 2021-06-09 DIAGNOSIS — D72819 Decreased white blood cell count, unspecified: Secondary | ICD-10-CM | POA: Insufficient documentation

## 2021-06-09 DIAGNOSIS — M5134 Other intervertebral disc degeneration, thoracic region: Secondary | ICD-10-CM | POA: Insufficient documentation

## 2021-06-09 DIAGNOSIS — Z8521 Personal history of malignant neoplasm of larynx: Secondary | ICD-10-CM | POA: Insufficient documentation

## 2021-07-16 ENCOUNTER — Telehealth: Payer: Self-pay

## 2021-07-16 NOTE — Telephone Encounter (Signed)
John:  Please review surgical note for possible difficult intubation from surgery on 07/2020.  Thank you

## 2021-07-24 ENCOUNTER — Ambulatory Visit (AMBULATORY_SURGERY_CENTER): Payer: Self-pay

## 2021-07-24 ENCOUNTER — Telehealth: Payer: Self-pay | Admitting: Internal Medicine

## 2021-07-24 ENCOUNTER — Other Ambulatory Visit: Payer: Self-pay

## 2021-07-24 VITALS — Ht 67.5 in | Wt 164.0 lb

## 2021-07-24 DIAGNOSIS — Z1211 Encounter for screening for malignant neoplasm of colon: Secondary | ICD-10-CM

## 2021-07-24 MED ORDER — PLENVU 140 G PO SOLR: 1.0000 | ORAL | 0 refills | Status: DC

## 2021-07-24 NOTE — Progress Notes (Signed)
No egg or soy allergy known to patient  No issues known to pt with past sedation with any surgeries or procedures Patient denies ever being told they had issues or difficulty with intubation  No FH of Malignant Hyperthermia Pt is not on diet pills Pt is not on home 02  Pt is not on blood thinners  Pt denies issues with constipation  No A fib or A flutter Pt is fully vaccinated for Covid x 2; Coupon given to pt in PV today, Code to Pharmacy and NO PA's for preps discussed with pt in PV today  Discussed with pt there will be an out-of-pocket cost for prep and that varies from $0 to 70 +  dollars - pt verbalized understanding  Due to the COVID-19 pandemic we are asking patients to follow certain guidelines in PV and the [place]   Pt aware of COVID protocols and [facility] guidelines   Extensive amount of time spent during pre visit going over instructions with patient- patient reports he will also get his son, daughter, and/or wife to read over the instructions to "make sure I do this right"; patient advised to call back to the office if further questions arise or if patient needs clarification of information; patient verbalized understanding of information;

## 2021-08-03 ENCOUNTER — Encounter: Payer: Self-pay | Admitting: Internal Medicine

## 2021-08-03 ENCOUNTER — Ambulatory Visit (AMBULATORY_SURGERY_CENTER): Payer: BC Managed Care – PPO | Admitting: Internal Medicine

## 2021-08-03 VITALS — BP 127/85 | HR 58 | Temp 96.8°F | Resp 19 | Ht 67.5 in | Wt 164.0 lb

## 2021-08-03 DIAGNOSIS — D649 Anemia, unspecified: Secondary | ICD-10-CM | POA: Insufficient documentation

## 2021-08-03 DIAGNOSIS — K635 Polyp of colon: Secondary | ICD-10-CM

## 2021-08-03 DIAGNOSIS — D124 Benign neoplasm of descending colon: Secondary | ICD-10-CM

## 2021-08-03 DIAGNOSIS — Z1211 Encounter for screening for malignant neoplasm of colon: Secondary | ICD-10-CM

## 2021-08-03 DIAGNOSIS — K649 Unspecified hemorrhoids: Secondary | ICD-10-CM

## 2021-08-03 MED ORDER — SODIUM CHLORIDE 0.9 % IV SOLN: 500.0000 mL | Freq: Once | INTRAVENOUS | Status: DC

## 2021-08-03 NOTE — Progress Notes (Signed)
Called to room to assist during endoscopic procedure.  Patient ID and intended procedure confirmed with present staff. Received instructions for my participation in the procedure from the performing physician.  

## 2021-08-03 NOTE — Progress Notes (Signed)
VS completed by DT.    Medical history reviewed and updated.  

## 2021-08-03 NOTE — Progress Notes (Signed)
Sedate, gd SR, tolerated procedure well, VSS, report to RN 

## 2021-08-03 NOTE — Progress Notes (Signed)
GASTROENTEROLOGY PROCEDURE H&P NOTE   Primary Care Physician: Default, Provider, MD    Reason for Procedure:   Colon cancer screening  Plan:    Colonoscopy  Patient is appropriate for endoscopic procedure(s) in the ambulatory ([facility]) setting.  The nature of the procedure, as well as the risks, benefits, and alternatives were carefully and thoroughly reviewed with the patient. Ample time for discussion and questions allowed. The patient understood, was satisfied, and agreed to proceed.     HPI: Juan Lewis is a 50 y.o. male who presents for colonoscopy for colon cancer screening. Denies hematochezia, melena, weight loss, or changes in bowel habits. Denies fam hx of colon cancer. Denies prior abdominal surgeries. Denies blood thinners.  Past Medical History:  Diagnosis Date   GERD (gastroesophageal reflux disease)    hx of   History of radiation therapy    2020/2021 for vocal cord lesion and prostate cancer   Hoarseness 2020   Lesion of vocal cord 2020   Prostate cancer ([place]) 2021    Past Surgical History:  Procedure Laterality Date   MICROLARYNGOSCOPY N/A 03/23/2019   Procedure: MICROLARYNGOSCOPY WITH EXCISION OF VOCAL CORD LESION;  Surgeon: Rozetta Nunnery, MD;  Location: [place];  Service: ENT;  Laterality: N/A;   MICROLARYNGOSCOPY N/A 07/15/2020   Procedure: DIAGNOSTIC LARYNGOSCOPY WITH MICROSCOPE AND BIOPSY;  Surgeon: Rozetta Nunnery, MD;  Location: [place];  Service: ENT;  Laterality: N/A;   MICROLARYNGOSCOPY WITH CO2 LASER AND EXCISION OF VOCAL CORD LESION N/A 07/28/2020   Procedure: MICROLARYNGOSCOPY WITH CO2 LASER AND EXCISION OF VOCAL CORD LESION;  Surgeon: Rozetta Nunnery, MD;  Location: [place];  Service: ENT;  Laterality: N/A;   PROSTATE BIOPSY      Prior to Admission medications   Not on File    No current outpatient medications on file.   Current Facility-Administered Medications  Medication Dose  Route Frequency Provider Last Rate Last Admin   0.9 %  sodium chloride infusion  500 mL Intravenous Once Sharyn Creamer, MD        Allergies as of 08/03/2021   (No Known Allergies)    Family History  Problem Relation Age of Onset   Prostate cancer Neg Hx    Breast cancer Neg Hx    Colon cancer Neg Hx    Pancreatic cancer Neg Hx    Colon polyps Neg Hx    Esophageal cancer Neg Hx    Rectal cancer Neg Hx    Stomach cancer Neg Hx     Social History   Socioeconomic History   Marital status: Married    Spouse name: [name]    Number of children: 4   Years of education: Not on file   Highest education level: Not on file  Occupational History   Not on file  Tobacco Use   Smoking status: Former    Packs/day: 0.50    Years: 30.00    Pack years: 15.00    Types: Cigarettes    Start date: 22    Quit date: 04/20/2019    Years since quitting: 2.2   Smokeless tobacco: Never   Tobacco comments:    he quit when he started radiation. 06/15/19  Vaping Use   Vaping Use: Never used  Substance and Sexual Activity   Alcohol use: Not Currently    Alcohol/week: 14.0 standard drinks    Types: 14 Glasses of wine per week   Drug use: No   Sexual activity:  Not Currently  Other Topics Concern   Not on file  Social History Narrative   Not on file   Social Determinants of Health   Financial Resource Strain: Not on file  Food Insecurity: No Food Insecurity   Worried About Running Out of Food in the Last Year: Never true   Ran Out of Food in the Last Year: Never true  Transportation Needs: Not on file  Physical Activity: Not on file  Stress: Not on file  Social Connections: Not on file  Intimate Partner Violence: Not on file    Physical Exam: Vital signs in last 24 hours: BP 108/69   Pulse 68   Temp (!) 96.8 F (36 C) (Temporal)   Ht 5' 7.5" (1.715 m)   Wt 164 lb (74.4 kg)   SpO2 97%   BMI 25.31 kg/m  GEN: NAD EYE: Sclerae anicteric ENT: MMM CV: Non-tachycardic Pulm: No  increased work of breathing GI: Soft, NT/ND NEURO:  Alert & Oriented   Christia Reading, MD [facility]  08/03/2021 10:55 AM

## 2021-08-03 NOTE — Patient Instructions (Signed)
Handout on polyps given. ° °YOU HAD AN ENDOSCOPIC PROCEDURE TODAY AT THE [facility]:   Refer to the procedure report that was given to you for any specific questions about what was found during the examination.  If the procedure report does not answer your questions, please call your gastroenterologist to clarify.  If you requested that your care partner not be given the details of your procedure findings, then the procedure report has been included in a sealed envelope for you to review at your convenience later. ° °YOU SHOULD EXPECT: Some feelings of bloating in the abdomen. Passage of more gas than usual.  Walking can help get rid of the air that was put into your GI tract during the procedure and reduce the bloating. If you had a lower endoscopy (such as a colonoscopy or flexible sigmoidoscopy) you may notice spotting of blood in your stool or on the toilet paper. If you underwent a bowel prep for your procedure, you may not have a normal bowel movement for a few days. ° °Please Note:  You might notice some irritation and congestion in your nose or some drainage.  This is from the oxygen used during your procedure.  There is no need for concern and it should clear up in a day or so. ° °SYMPTOMS TO REPORT IMMEDIATELY: ° °Following lower endoscopy (colonoscopy or flexible sigmoidoscopy): ° Excessive amounts of blood in the stool ° Significant tenderness or worsening of abdominal pains ° Swelling of the abdomen that is new, acute ° Fever of 100°F or higher ° °For urgent or emergent issues, a gastroenterologist can be reached at any hour by calling [phone]. °Do not use MyChart messaging for urgent concerns.  ° ° °DIET:  We do recommend a small meal at first, but then you may proceed to your regular diet.  Drink plenty of fluids but you should avoid alcoholic beverages for 24 hours. ° °ACTIVITY:  You should plan to take it easy for the rest of today and you should NOT DRIVE or use heavy machinery  until tomorrow (because of the sedation medicines used during the test).   ° °FOLLOW UP: °Our staff will call the number listed on your records 48-72 hours following your procedure to check on you and address any questions or concerns that you may have regarding the information given to you following your procedure. If we do not reach you, we will leave a message.  We will attempt to reach you two times.  During this call, we will ask if you have developed any symptoms of COVID 19. If you develop any symptoms (ie: fever, flu-like symptoms, shortness of breath, cough etc.) before then, please call [phone].  If you test positive for Covid 19 in the 2 weeks post procedure, please call and report this information to us.   ° °If any biopsies were taken you will be contacted by phone or by letter within the next 1-3 weeks.  Please call us at [phone] if you have not heard about the biopsies in 3 weeks.  ° ° °SIGNATURES/CONFIDENTIALITY: °You and/or your care partner have signed paperwork which will be entered into your electronic medical record.  These signatures attest to the fact that that the information above on your After Visit Summary has been reviewed and is understood.  Full responsibility of the confidentiality of this discharge information lies with you and/or your care-partner.  °

## 2021-08-03 NOTE — Op Note (Signed)
[name]: Juan Lewis Procedure Date: 08/03/2021 10:50 AM MRN: [id number] Endoscopist: Sonny Masters "Juan Lewis ,  Age: 50 Referring MD:  Date of Birth: 07/07/1971 Gender: Male Account #: [id number] Procedure:                Colonoscopy Indications:              Screening for colorectal malignant neoplasm Medicines:                Monitored Anesthesia Care Procedure:                Pre-Anesthesia Assessment:                           - Prior to the procedure, a History and Physical                            was performed, and patient medications and                            allergies were reviewed. The patient's tolerance of                            previous anesthesia was also reviewed. The risks                            and benefits of the procedure and the sedation                            options and risks were discussed with the patient.                            All questions were answered, and informed consent                            was obtained. Prior Anticoagulants: The patient has                            taken no previous anticoagulant or antiplatelet                            agents. ASA Grade Assessment: II - A patient with                            mild systemic disease. After reviewing the risks                            and benefits, the patient was deemed in                            satisfactory condition to undergo the procedure.                           After obtaining informed consent, the colonoscope  was passed under direct vision. Throughout the                            procedure, the patient's blood pressure, pulse, and                            oxygen saturations were monitored continuously. The                            CF [id number] #[id number] was introduced through the anus                            and advanced to the the terminal ileum. The                            colonoscopy was  performed without difficulty. The                            patient tolerated the procedure well. The quality                            of the bowel preparation was adequate. Scope In: 11:05:38 AM Scope Out: 11:27:46 AM Scope Withdrawal Time: 0 hours 18 minutes 31 seconds  Total Procedure Duration: 0 hours 22 minutes 8 seconds  Findings:                 The terminal ileum appeared normal.                           Two sessile polyps were found in the descending                            colon. The polyps were 3 to 6 mm in size. These                            polyps were removed with a cold snare. Resection                            and retrieval were complete.                           Non-bleeding internal hemorrhoids were found during                            retroflexion. Complications:            No immediate complications. Estimated Blood Loss:     Estimated blood loss was minimal. Impression:               - The examined portion of the ileum was normal.                           - Two 3 to 6 mm polyps in the descending colon,  removed with a cold snare. Resected and retrieved.                           - Non-bleeding internal hemorrhoids. Recommendation:           - Discharge patient to home (with escort).                           - Await pathology results.                           - The findings and recommendations were discussed                            with the patient. Sonny Masters "Juan Lewis,  08/03/2021 11:32:10 AM

## 2021-08-05 ENCOUNTER — Telehealth: Payer: Self-pay

## 2021-08-05 ENCOUNTER — Other Ambulatory Visit: Payer: Self-pay

## 2021-08-05 ENCOUNTER — Ambulatory Visit (INDEPENDENT_AMBULATORY_CARE_PROVIDER_SITE_OTHER): Payer: BC Managed Care – PPO | Admitting: Otolaryngology

## 2021-08-05 DIAGNOSIS — Z8521 Personal history of malignant neoplasm of larynx: Secondary | ICD-10-CM

## 2021-08-05 NOTE — Progress Notes (Signed)
HPI: Juan Lewis is a 50 y.o. 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 He was initially diagnosed with right anterior vocal cord and anterior commissure cancer in June 2020.  This was initially treated with radiation therapy that he completed in August 2020.  He subsequently was diagnosed with prostate cancer and underwent IMRT treatment of the prostate cancer that he completed in June 2021. 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  He ultimately underwent laser excision of the right anterior vocal cord and anterior commissure in October 2021.  He has done well since that time with no evidence of recurrent cancer on fiberoptic laryngoscopy. He last saw Dr. Isidore Moos on 06/05/2021.  Past Medical History:  Diagnosis Date   GERD (gastroesophageal reflux disease)    hx of   History of radiation therapy    2020/2021 for vocal cord lesion and prostate cancer   Hoarseness 2020   Lesion of vocal cord 2020   Prostate cancer ([place]) 2021   Past Surgical History:  Procedure Laterality Date   MICROLARYNGOSCOPY N/A 03/23/2019   Procedure: MICROLARYNGOSCOPY WITH EXCISION OF VOCAL CORD LESION;  Surgeon: Rozetta Nunnery, MD;  Location: [facility];  Service: ENT;  Laterality: N/A;   MICROLARYNGOSCOPY N/A 07/15/2020   Procedure: DIAGNOSTIC LARYNGOSCOPY WITH MICROSCOPE AND BIOPSY;  Surgeon: Rozetta Nunnery, MD;  Location: [facility];  Service: ENT;  Laterality: N/A;   MICROLARYNGOSCOPY WITH CO2 LASER AND EXCISION OF VOCAL CORD LESION N/A 07/28/2020   Procedure: MICROLARYNGOSCOPY WITH CO2 LASER AND EXCISION OF VOCAL CORD LESION;  Surgeon: Rozetta Nunnery, MD;  [facility];  Service: ENT;  Laterality: N/A;   PROSTATE BIOPSY     Social History   Socioeconomic History   Marital status: Married    Spouse name:  [place]    Number of children: 4   Years of education: Not on file   Highest education level: Not on file  Occupational History   Not on file  Tobacco Use   Smoking status: Former    Packs/day: 0.50    Years: 30.00    Pack years: 15.00    Types: Cigarettes    Start date: 4    Quit date: 04/20/2019    Years since quitting: 2.2   Smokeless tobacco: Never   Tobacco comments:    he quit when he started radiation. 06/15/19  Vaping Use   Vaping Use: Never used  Substance and Sexual Activity   Alcohol use: Not Currently    Alcohol/week: 14.0 standard drinks    Types: 14 Glasses of wine per week   Drug use: No   Sexual activity: Not Currently  Other Topics Concern   Not on file  Social History Narrative   Not on file   Social Determinants of Health   Financial Resource Strain: Not on file  Food Insecurity: No Food Insecurity   Worried About Charity fundraiser in the Last Year: Never true   Ran Out of Food in the Last Year: Never true  Transportation Needs: Not on file  Physical Activity: Not on file  Stress: Not on file  Social Connections: Not on file   Family History  Problem Relation Age of Onset   Prostate cancer Neg Hx    Breast cancer Neg Hx    Colon cancer Neg Hx    Pancreatic cancer Neg Hx  Colon polyps Neg Hx    Esophageal cancer Neg Hx    Rectal cancer Neg Hx    Stomach cancer Neg Hx    No Known Allergies Prior to Admission medications   Not on File     Positive ROS: Otherwise negative  All other systems have been reviewed and were otherwise negative with the exception of those mentioned in the HPI and as above.  Physical Exam: Constitutional: Alert, well-appearing, no acute distress Ears: External ears without lesions or tenderness. Ear canals are clear bilaterally with intact, clear TMs.  Nasal: External nose without lesions. Septum with minimal deformity and mild rhinitis.. Clear nasal passages Oral: Lips and gums without lesions. Tongue and  palate mucosa without lesions. Posterior oropharynx clear. Fiberoptic laryngoscopy was performed to the left nostril.  The nasopharynx was clear.  The base of tongue vallecula and epiglottis were normal.  On evaluation of vocal cords he had a small defect in the anterior right true vocal cord but no mucosal abnormalities noted no leukoplakia no ulcerations noted.  No cords had symmetric mobility. Neck: No palpable adenopathy or masses.  No palpable adenopathy on either side of the neck or in the supraclavicular region. Respiratory: Breathing comfortably  Skin: No facial/neck lesions or rash noted.  Laryngoscopy  Date/Time: 08/05/2021 1:49 PM Performed by: Rozetta Nunnery, MD Authorized by: Rozetta Nunnery, MD   Consent:    Consent obtained:  Verbal   Consent given by:  Patient Procedure details:    Indications: oncologic surveillance follow-up     Medication:  Afrin   Instrument: flexible fiberoptic laryngoscope     Scope location: right nare   Sinus:    Right nasopharynx: normal   Mouth:    Oropharynx: normal     Vallecula: normal     Base of tongue: normal     Epiglottis: normal   Throat:    Pyriform sinus: normal     True vocal cords: normal   Comments:     Vocal cords were clear bilaterally as was the anterior commissure.  Small defect in the anterior right true vocal cord we had previous laser cordectomy performed.     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 No clinical evidence of persistent disease.  Plan: Reviewed with the patient today concerning normal exam in the office today. I gave him photos of the initial cancer as well as a subsequent cancer following the radiation therapy. I recommended that he follow-up with Dr. Elie Goody in 6 months.   Radene Journey, MD

## 2021-08-05 NOTE — Telephone Encounter (Signed)
  Follow up Call-  Call back number 08/03/2021  Post procedure Call Back phone  # [phone]  Permission to leave phone message Yes  Some recent data might be hidden     Patient questions:  Do you have a fever, pain , or abdominal swelling? No. Pain Score  0 *  Have you tolerated food without any problems? Yes.    Have you been able to return to your normal activities? Yes.    Do you have any questions about your discharge instructions: Diet   No. Medications  No. Follow up visit  No.  Do you have questions or concerns about your Care? No.  Actions: * If pain score is 4 or above: No action needed, pain <4.

## 2021-08-06 ENCOUNTER — Encounter: Payer: Self-pay | Admitting: Internal Medicine

## 2021-10-30 NOTE — Telephone Encounter (Signed)
error 

## 2021-12-03 NOTE — Progress Notes (Signed)
Mr. Elmore presents for follow up of radiation completed 06/01/2019 to his head and neck  Pain issues, if any: Denies any mouth or throat pain Using a feeding tube?: N/A Weight changes, if any:  Wt Readings from Last 3 Encounters:  12/04/21 162 lb 4 oz (73.6 kg)  08/03/21 164 lb (74.4 kg)  07/24/21 164 lb (74.4 kg)   Swallowing issues, if any: Patient denies. Reports he can eat/drink a wide variety without difficulty  Smoking or chewing tobacco? None Using fluoride trays daily? N/A--currently does not see a community dentist regularly, but denies any oral/dental concerns Last ENT visit was on: 08/05/2021 Saw Dr. Melony Overly: "Laryngoscopy --Comments:  Vocal cords were clear bilaterally as was the anterior commissure.  Small defect in the anterior right true vocal cord we had previous laser cordectomy performed.  No clinical evidence of persistent disease. --Plan: Reviewed with the patient today concerning normal exam in the office today. I gave him photos of the initial cancer as well as a subsequent cancer following the radiation therapy. I recommended that he follow-up with Dr. Elie Goody in 6 months"  Other notable issues, if any: Denies any ear or jaw pain, or difficulty opening his mouth. Denies any noticeable swelling to neck or difficulty with range of motion. Continues to deal with vocal hoarseness. Reports he has been able to space out his urology F/U for PSA monitoring to every 6 months. Overall, reports he feels good and is doing well

## 2021-12-04 ENCOUNTER — Telehealth: Payer: Self-pay | Admitting: *Deleted

## 2021-12-04 ENCOUNTER — Telehealth: Payer: Self-pay

## 2021-12-04 ENCOUNTER — Encounter: Payer: Self-pay | Admitting: Radiation Oncology

## 2021-12-04 ENCOUNTER — Other Ambulatory Visit: Payer: Self-pay

## 2021-12-04 ENCOUNTER — Ambulatory Visit
Admission: RE | Admit: 2021-12-04 | Discharge: 2021-12-04 | Disposition: A | Discharge status: Home / Self Care | Payer: BC Managed Care – PPO | Source: Ambulatory Visit | Attending: Radiation Oncology | Admitting: Radiation Oncology

## 2021-12-04 VITALS — BP 110/85 | HR 61 | Temp 96.4°F | Resp 18 | Ht 67.5 in | Wt 162.2 lb

## 2021-12-04 DIAGNOSIS — C320 Malignant neoplasm of glottis: Secondary | ICD-10-CM

## 2021-12-04 DIAGNOSIS — Z79899 Other long term (current) drug therapy: Secondary | ICD-10-CM | POA: Insufficient documentation

## 2021-12-04 DIAGNOSIS — Z1329 Encounter for screening for other suspected endocrine disorder: Secondary | ICD-10-CM

## 2021-12-04 LAB — TSH: TSH: 3.626 u[IU]/mL (ref 0.320–4.118)

## 2021-12-04 MED ORDER — OXYMETAZOLINE HCL 0.05 % NA SOLN
2.0000 | Freq: Once | NASAL | Status: AC
  Administered 2021-12-04: 2 via NASAL
  Filled 2021-12-04: qty 30

## 2021-12-04 NOTE — Progress Notes (Signed)
Oncology Nurse Navigator Documentation   Per patient's 12/04/21 post-treatment follow-up with Dr. Isidore Moos, sent fax to [facility] E[facility] Scheduling with request Mr. Woodbury be contacted and scheduled for routine post-RT follow-up with Dr. Constance Holster in 4 months due to the recent retirement of Dr. Loletha Grayer. [place].  Notification of successful fax transmission received.   Harlow Asa RN, BSN, OCN Head & Neck Oncology Nurse [place] [facility] Phone # [phone]  Fax # [phone]

## 2021-12-04 NOTE — Telephone Encounter (Signed)
Called and spoke with patient directly. Relayed normal TSH result from this morning's lab work. Patient verbalized understanding and appreciation of call. No other needs identified at this time

## 2021-12-04 NOTE — Progress Notes (Signed)
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 Melony Overly Microlaryngoscopy with CO2 laser excision of right anterior true vocal cord, anterior commissure and superficial left anterior true vocal cord. --FINAL MICROSCOPIC DIAGNOSIS:  A. VOCAL CORD, LEFT ANTERIOR, BIOPSY:  - Squamous mucosa with atypia concerning for low grade dysplasia - Negative for carcinoma  B. VOCAL CORD, RIGHT ANTERIOR BIOPSY, AND ANTERIOR COMMISSURE:  - Minute focus of invasive moderately differentiated squamous cell carcinoma     CHIEF COMPLAINT:  Here for follow-up and surveillance of glottic cancer  Narrative:    Juan Lewis presents for follow up of radiation completed 06/01/2019 to his head and neck  Pain issues, if any: Denies any mouth or throat pain Using a feeding tube?: N/A Weight changes, if any:  Wt Readings from Last 3 Encounters:  12/04/21 162 lb 4 oz (73.6 kg)  08/03/21 164 lb (74.4 kg)  07/24/21 164 lb (74.4 kg)   Swallowing issues, if any: Patient denies. Reports he can eat/drink a wide variety without difficulty  Smoking or chewing tobacco? None Using fluoride trays daily? N/A--currently does not see a community dentist regularly, but denies any oral/dental concerns Last ENT visit was on: 08/05/2021 Saw Dr. Melony Overly: "Laryngoscopy --Comments:  Vocal cords were clear bilaterally as was  the anterior commissure.  Small defect in the anterior right true vocal cord we had previous laser cordectomy performed.  No clinical evidence of persistent disease. --Plan: Reviewed with the patient today concerning normal exam in the office today. I gave him photos of the initial cancer as well as a subsequent cancer following the radiation therapy. I recommended that he follow-up with Dr. Elie Goody in 6 months"  Other notable issues, if any: Denies any ear or jaw pain, or difficulty opening his mouth. Denies any noticeable swelling to neck or difficulty with range of motion. Continues to deal with vocal hoarseness. Reports he has been able to space out his urology F/U for PSA monitoring to every 6 months. Overall, reports he feels good and is doing well   ALLERGIES:  has No Known Allergies.  Meds: Current Outpatient Medications  Medication Sig Dispense Refill   silodosin (RAPAFLO) 4 MG CAPS capsule Take 1 capsule by mouth at bedtime.     No current facility-administered medications for this encounter.    Physical Findings: The patient is in no acute distress. Patient is alert and oriented. Wt Readings from Last 3 Encounters:  12/04/21 162 lb 4 oz (73.6 kg)  08/03/21 164 lb (74.4 kg)  07/24/21 164 lb (74.4 kg)    height is 5' 7.5" (1.715 m) and weight is 162 lb 4 oz (73.6 kg). His temporal temperature is 96.4 F (35.8 C) (abnormal). His blood pressure is 110/85 and his pulse is 61. His respiration is 18 and oxygen saturation is 100%. .  General: Alert and oriented, in no  acute distress HEENT: Head is normocephalic.  Oral cavity and oropharynx  notable for no lesions Neck: No palpable adenopathy Chest clear to auscultation bilaterally Heart regular in rate and rhythm Abdomen soft and nontender Skin: Satisfactory healing of skin of neck skin with residual hyperpigmentation Psychiatric: Judgment and insight are intact. Affect is appropriate.  PROCEDURE NOTE:  PROCEDURE NOTE:  After obtaining consent and anesthetizing the nasal cavity with topical oxymetolazine, the flexible endoscope was introduced and passed through the nasal cavity.  The nasopharynx, oropharynx, larynx, and hypopharynx were then examined. No lesions appreciated in nasopharynx, oropharynx, hypopharynx, or larynx. True cords are without lesions or paralysis.  Tissue deficit noted at anterior right true cord from previous biopsy.   Lab Findings: Lab Results  Component Value Date   WBC 2.9 (L) 01/30/2021   HGB 13.0 01/30/2021   HCT 39.3 01/30/2021   MCV 89 01/30/2021   PLT 264 01/30/2021    Lab Results  Component Value Date   TSH 3.626 12/04/2021    Radiographic Findings: No results found.  Impression/Plan:    1) Head and Neck Cancer Status: NED  2) Nutritional Status: No issues  3) Risk Factors: The patient has been educated about risk factors including alcohol and tobacco abuse; they understand that avoidance of alcohol and tobacco is important to prevent recurrences as well as other cancers - not smoking.  4) Swallowing: No issues  5)  Thyroid function:  Lab Results  Component Value Date   TSH 3.626 12/04/2021  Within normal limits, check annually  6) F/u  w/ ENT (navigator will notify office to schedule), see me back in 47mo.    On date of service, in total, I spent 30 minutes on this encounter. Patient was seen in person.  _____________________________________   Eppie Gibson, MD

## 2021-12-04 NOTE — Telephone Encounter (Signed)
-----   Message from Eppie Gibson, MD sent at 12/04/2021  1:44 PM EST ----- Can you let him know TSH was normal? Thanks! ----- Message ----- From: Interface, Lab In [place] Sent: 12/04/2021  11:58 AM EST To: Eppie Gibson, MD

## 2021-12-04 NOTE — Telephone Encounter (Signed)
CALLED PATIENT TO ASK ABOUT COMING FOR LABS TO DAY PRIOR TO FOLLOW WITH DR. Isidore Lewis, SPOKE WITH PATIENT AND HE AGREED TO DO SO

## 2021-12-31 ENCOUNTER — Encounter (HOSPITAL_COMMUNITY): Payer: Self-pay

## 2021-12-31 ENCOUNTER — Telehealth: Payer: Self-pay | Admitting: Registered Nurse

## 2021-12-31 ENCOUNTER — Emergency Department (HOSPITAL_COMMUNITY): Payer: BC Managed Care – PPO

## 2021-12-31 ENCOUNTER — Other Ambulatory Visit: Payer: Self-pay

## 2021-12-31 ENCOUNTER — Encounter: Payer: Self-pay | Admitting: Registered Nurse

## 2021-12-31 ENCOUNTER — Emergency Department (HOSPITAL_COMMUNITY)
Admission: EM | Admit: 2021-12-31 | Discharge: 2021-12-31 | Disposition: A | Discharge status: Home / Self Care | Payer: BC Managed Care – PPO | Attending: Emergency Medicine | Admitting: Emergency Medicine

## 2021-12-31 DIAGNOSIS — R29810 Facial weakness: Secondary | ICD-10-CM

## 2021-12-31 DIAGNOSIS — Z8546 Personal history of malignant neoplasm of prostate: Secondary | ICD-10-CM | POA: Insufficient documentation

## 2021-12-31 DIAGNOSIS — G510 Bell's palsy: Secondary | ICD-10-CM | POA: Diagnosis not present

## 2021-12-31 DIAGNOSIS — R791 Abnormal coagulation profile: Secondary | ICD-10-CM | POA: Diagnosis not present

## 2021-12-31 DIAGNOSIS — Z8521 Personal history of malignant neoplasm of larynx: Secondary | ICD-10-CM | POA: Insufficient documentation

## 2021-12-31 LAB — CBC WITH DIFFERENTIAL/PLATELET
Abs Immature Granulocytes: 0 10*3/uL (ref 0.00–0.07)
Basophils Absolute: 0 10*3/uL (ref 0.0–0.1)
Basophils Relative: 1 %
Eosinophils Absolute: 0 10*3/uL (ref 0.0–0.5)
Eosinophils Relative: 1 %
HCT: 42.8 % (ref 39.0–52.0)
Hemoglobin: 14 g/dL (ref 13.0–17.0)
Immature Granulocytes: 0 %
Lymphocytes Relative: 48 %
Lymphs Abs: 1.8 10*3/uL (ref 0.7–4.0)
MCH: 30.1 pg (ref 26.0–34.0)
MCHC: 32.7 g/dL (ref 30.0–36.0)
MCV: 92 fL (ref 80.0–100.0)
Monocytes Absolute: 0.4 10*3/uL (ref 0.1–1.0)
Monocytes Relative: 10 %
Neutro Abs: 1.4 10*3/uL — ABNORMAL LOW (ref 1.7–7.7)
Neutrophils Relative %: 40 %
Platelets: 225 10*3/uL (ref 150–400)
RBC: 4.65 MIL/uL (ref 4.22–5.81)
RDW: 13.5 % (ref 11.5–15.5)
WBC: 3.6 10*3/uL — ABNORMAL LOW (ref 4.0–10.5)
nRBC: 0 % (ref 0.0–0.2)

## 2021-12-31 LAB — COMPREHENSIVE METABOLIC PANEL
ALT: 18 U/L (ref 0–44)
AST: 23 U/L (ref 15–41)
Albumin: 4.4 g/dL (ref 3.5–5.0)
Alkaline Phosphatase: 37 U/L — ABNORMAL LOW (ref 38–126)
Anion gap: 7 (ref 5–15)
BUN: 15 mg/dL (ref 6–20)
CO2: 27 mmol/L (ref 22–32)
Calcium: 9.8 mg/dL (ref 8.9–10.3)
Chloride: 104 mmol/L (ref 98–111)
Creatinine, Ser: 1.21 mg/dL (ref 0.61–1.24)
GFR, Estimated: >60 mL/min (ref >60)
Glucose, Bld: 95 mg/dL (ref 70–99)
Potassium: 4.3 mmol/L (ref 3.5–5.1)
Sodium: 138 mmol/L (ref 135–145)
Total Bilirubin: 0.7 mg/dL (ref 0.3–1.2)
Total Protein: 7.3 g/dL (ref 6.5–8.1)

## 2021-12-31 LAB — PROTIME-INR
INR: 1 (ref 0.8–1.2)
Prothrombin Time: 13 seconds (ref 11.4–15.2)

## 2021-12-31 MED ORDER — PREDNISONE 10 MG PO TABS: 60.0000 mg | ORAL_TABLET | Freq: Every day | ORAL | 0 refills | Status: AC

## 2021-12-31 MED ORDER — GADOBUTROL 1 MMOL/ML IV SOLN
7.0000 mL | Freq: Once | INTRAVENOUS | Status: AC | PRN
  Administered 2021-12-31: 7 mL via INTRAVENOUS

## 2021-12-31 MED ORDER — ARTIFICIAL TEARS OPHTHALMIC OINT: TOPICAL_OINTMENT | Freq: Every evening | OPHTHALMIC | 0 refills | Status: AC | PRN

## 2021-12-31 NOTE — ED Provider Notes (Signed)
[facility] EMERGENCY DEPARTMENT Provider Note   CSN: [id number] Arrival date & time: 12/31/21  4098     History  Chief Complaint  Patient presents with   Eye Problem    Juan Lewis is a 51 y.o. male.  The history is provided by the patient and medical records. No language interpreter was used.  Neurologic Problem This is a new problem. The current episode started 12 to 24 hours ago. The problem occurs constantly. The problem has not changed since onset.Pertinent negatives include no chest pain, no abdominal pain, no headaches and no shortness of breath. Nothing aggravates the symptoms. Nothing relieves the symptoms. He has tried nothing for the symptoms. The treatment provided no relief.      Home Medications Prior to Admission medications   Medication Sig Start Date End Date Taking? Authorizing Provider  silodosin (RAPAFLO) 4 MG CAPS capsule Take 1 capsule by mouth at bedtime. 11/13/21   [provider]      Allergies    Patient has no known allergies.    Review of Systems   Review of Systems  Constitutional:  Negative for chills, fatigue and fever.  HENT:  Negative for congestion.   Eyes:  Negative for photophobia, pain and visual disturbance.  Respiratory:  Negative for cough, chest tightness and shortness of breath.   Cardiovascular:  Negative for chest pain and palpitations.  Gastrointestinal:  Negative for abdominal pain, constipation, diarrhea, nausea and vomiting.  Genitourinary:  Negative for dysuria, flank pain and frequency.  Musculoskeletal:  Negative for back pain, neck pain and neck stiffness.  Skin:  Negative for rash and wound.  Neurological:  Positive for facial asymmetry, weakness and numbness. Negative for speech difficulty, light-headedness and headaches.  Psychiatric/Behavioral:  Negative for agitation and confusion.   All other systems reviewed and are negative.  Physical Exam Updated Vital Signs BP (!) 180/112 (BP  Location: Right Arm)   Pulse 64   Temp 97.7 F (36.5 C) (Oral)   Resp 18   Ht 5\' 7"  (1.702 m)   Wt 73.5 kg   SpO2 97%   BMI 25.37 kg/m  Physical Exam Vitals and nursing note reviewed.  Constitutional:      General: He is not in acute distress.    Appearance: He is well-developed. He is not ill-appearing, toxic-appearing or diaphoretic.  HENT:     Head: Atraumatic.     Nose: Nose normal.     Mouth/Throat:     Mouth: Mucous membranes are moist.     Pharynx: No oropharyngeal exudate.  Eyes:     Conjunctiva/sclera: Conjunctivae normal.  Cardiovascular:     Rate and Rhythm: Normal rate and regular rhythm.     Heart sounds: No murmur heard. Pulmonary:     Effort: Pulmonary effort is normal. No respiratory distress.     Breath sounds: Normal breath sounds. No wheezing, rhonchi or rales.  Chest:     Chest wall: No tenderness.  Abdominal:     General: Abdomen is flat.     Palpations: Abdomen is soft.     Tenderness: There is no abdominal tenderness. There is no right CVA tenderness, left CVA tenderness, guarding or rebound.  Musculoskeletal:        General: No swelling or tenderness.     Cervical back: Neck supple.     Right lower leg: No edema.     Left lower leg: No edema.  Skin:    General: Skin is warm and dry.  Capillary Refill: Capillary refill takes less than 2 seconds.     Findings: No erythema or rash.  Neurological:     Mental Status: He is alert.     Cranial Nerves: Facial asymmetry present.     Sensory: Sensory deficit present.     Motor: Weakness present. No tremor or abnormal muscle tone.     Coordination: Coordination normal. Finger-Nose-Finger Test normal.     Gait: Gait normal.     Comments: Left facial droop including the forehead.  Patient unable to close eye with effort.  Pupils are symmetric and reactive, extract movements.  Subjective numbness in left face and.  Symmetric grip strength.  No neurologic deficits in legs.  No carotid bruit appreciated.   Hoarse voice reportedly unchanged baseline  Psychiatric:        Mood and Affect: Mood normal.    ED Results / Procedures / Treatments   Labs (all labs ordered are listed, but only abnormal results are displayed) Labs Reviewed  CBC WITH DIFFERENTIAL/PLATELET - Abnormal; Notable for the following components:      Result Value   WBC 3.6 (*)    Neutro Abs 1.4 (*)    All other components within normal limits  COMPREHENSIVE METABOLIC PANEL - Abnormal; Notable for the following components:   Alkaline Phosphatase 37 (*)    All other components within normal limits  PROTIME-INR    EKG EKG Interpretation  Date/Time:  Thursday December 31 2021 08:36:02 EDT Ventricular Rate:  58 PR Interval:  159 QRS Duration: 101 QT Interval:  397 QTC Calculation: 390 R Axis:   60 Text Interpretation: Sinus rhythm Probable left ventricular hypertrophy when compared to prior, similar appearance. No STEMI Confirmed by Theda Belfast ([id number]) on 12/31/2021 8:40:33 AM  Radiology MR BRAIN W WO CONTRAST  Result Date: 12/31/2021 CLINICAL DATA:  Transient ischemic attack. Acute left facial nerve palsy. EXAM: MRI HEAD WITHOUT AND WITH CONTRAST TECHNIQUE: Multiplanar, multiecho pulse sequences of the brain and surrounding structures were obtained without and with intravenous contrast. CONTRAST:  7mL GADAVIST GADOBUTROL 1 MMOL/ML IV SOLN COMPARISON:  None. FINDINGS: Brain: Diffusion imaging does not show any acute or subacute infarction. The brainstem and cerebellum are normal. CP angle regions are normal. Seventh and eighth nerve complexes appear normal on the right. No evidence of neuroma or neuritis. On the left, there is enhancement of the left facial nerve consistent with neuritis. No sign of neuroma. No cranial nerve abnormality is visible. Cerebral hemispheres show scattered punctate foci of T2 and FLAIR signal within the white matter, mild in degree and nonspecific. No cortical or large vessel territory insult. No  mass lesion, hemorrhage, hydrocephalus or extra-axial collection. After contrast administration, no abnormal enhancement affects the brain or leptomeninges. Vascular: Major vessels at the base of the brain show flow. Skull and upper cervical spine: Negative Sinuses/Orbits: Clear/normal Other: No fluid in the middle ears or mastoids. IMPRESSION: Enhancement of the left facial nerve consistent with Bell's palsy. No acute brain finding. Few punctate foci of T2 and FLAIR signal within the cerebral hemispheric white matter. Electronically Signed   By: Paulina Fusi M.D.   On: 12/31/2021 14:05   MR FACE/TRIGEMINAL WO/W CM  Result Date: 12/31/2021 CLINICAL DATA:  Transient ischemic attack. Acute left facial nerve palsy. EXAM: MRI HEAD WITHOUT AND WITH CONTRAST TECHNIQUE: Multiplanar, multiecho pulse sequences of the brain and surrounding structures were obtained without and with intravenous contrast. CONTRAST:  7mL GADAVIST GADOBUTROL 1 MMOL/ML IV SOLN COMPARISON:  None. FINDINGS:  Brain: Diffusion imaging does not show any acute or subacute infarction. The brainstem and cerebellum are normal. CP angle regions are normal. Seventh and eighth nerve complexes appear normal on the right. No evidence of neuroma or neuritis. On the left, there is enhancement of the left facial nerve consistent with neuritis. No sign of neuroma. No cranial nerve abnormality is visible. Cerebral hemispheres show scattered punctate foci of T2 and FLAIR signal within the white matter, mild in degree and nonspecific. No cortical or large vessel territory insult. No mass lesion, hemorrhage, hydrocephalus or extra-axial collection. After contrast administration, no abnormal enhancement affects the brain or leptomeninges. Vascular: Major vessels at the base of the brain show flow. Skull and upper cervical spine: Negative Sinuses/Orbits: Clear/normal Other: No fluid in the middle ears or mastoids. IMPRESSION: Enhancement of the left facial nerve  consistent with Bell's palsy. No acute brain finding. Few punctate foci of T2 and FLAIR signal within the cerebral hemispheric white matter. Electronically Signed   By: Paulina Fusi M.D.   On: 12/31/2021 14:05    Procedures Procedures    Medications Ordered in ED Medications  gadobutrol (GADAVIST) 1 MMOL/ML injection 7 mL (7 mLs Intravenous Contrast Given 12/31/21 1356)    ED Course/ Medical Decision Making/ A&P                           Medical Decision Making Amount and/or Complexity of Data Reviewed Labs: ordered.  Risk Prescription drug management.    Juan Lewis is a 51 y.o. 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  According to patient, since Sunday patient has had some pain and headache in his left neck and his head and then for the last 2 days has had developing symptoms of left facial droop/numbness and left arm numbness.  He said that this morning the facial droop was severe prompting him to come in for evaluation.  He says that his speech sounded different with dysarthria but is not admitting to aphasia.  He is not reporting any vision changes.  He does not describe fevers, chills, nausea, vomiting, or other complaints.  Denies any chest pain or palpitations.  Does not report any shortness of breath.  He does not report any trauma.    He denies any leg symptoms but on my brief exam, patient does have left facial droop including the forehead.  He is unable to close his left eye with effort.  Pupils however are symmetric and reactive normal extraocular movements.  He has intact strength in arms and legs and has normal finger-nose-finger testing.  He does report sensory difference with numbness in the left arm compared to the right.  He reports numbness in the left face compared to the right.  Good  pulses in extremities.  Legs had normal strength, sensation, and pulses.  Abdomen nontender.  Chest nontender.  Back nontender.  We will touch base with neurology.  Clinically this appears like a Bell's palsy with the left facial symptoms however the component of left arm symptoms and his history of malignancy with his prostate and throat cancer with neck and headache may slightly complicate the picture.  Will discuss with neurology what type of imaging may be most beneficial for patient.  Anticipate reassessment after work-up  Neurology saw the patient and suspect Bell's palsy however due  to his cancer history they do agree with MRI to look for acute abnormality.  They placed the order.  MRI returned showing evidence of Bell's palsy but otherwise no acute stroke is reported.  Neurology saw the patient and feel he is safe for discharge home to use ophthalmic ointment at night to tape his eye closed and artificial tears in the daytime.  Due to the onset being within the last 24 hours, we also will give prescription for steroids and have him follow-up with outpatient neurology and PCP.  Patient other questions or concerns and was discharged in good condition.         Final Clinical Impression(s) / ED Diagnoses Final diagnoses:  Bell's palsy  Facial droop    Rx / DC Orders ED Discharge Orders          Ordered    predniSONE (DELTASONE) 10 MG tablet  Daily        12/31/21 1510    artificial tears (LACRILUBE) OINT ophthalmic ointment  At bedtime PRN        12/31/21 1510            Clinical Impression: 1. Bell's palsy   2. Facial droop     Disposition: Discharge  Condition: Good  I have discussed the results, Dx and Tx plan with the pt(& family if present). He/she/they expressed understanding and agree(s) with the plan. Discharge instructions discussed at great length. Strict return precautions discussed and pt &/or family have verbalized understanding of the instructions.  No further questions at time of discharge.    New Prescriptions   ARTIFICIAL TEARS (LACRILUBE) OINT OPHTHALMIC OINTMENT    Place into the left eye at bedtime as needed for dry eyes. Please place the ophthalmic ointment in your eye and then tape your eyelid closed at night.   PREDNISONE (DELTASONE) 10 MG TABLET    Take 6 tablets (60 mg total) by mouth daily for 7 days.    Follow Up: [facility] [address] [place] [address] [phone]    [facility] EMERGENCY DEPARTMENT [address] [id number] mc [place] [address] [phone]    [facility] [address] [place] [address] [place] [address] [phone] Schedule an appointment as soon as possible for a visit        Angeldejesus Callaham, Canary Brim, MD 12/31/21 [phone]

## 2021-12-31 NOTE — Discharge Instructions (Signed)
Your history, exam, work-up today are consistent with a Bell's palsy causing a left facial droop.  As your eye muscles are now weak and have difficult time closing, you will need to protect your left eye.  Neurology recommends using the ophthalmic ointment at night and taping it closed.  They also recommend using artificial tears you can get over-the-counter when awake to help keep them lubricated.  Please use the steroids for the next week to help and please rest and stay hydrated.  Please follow-up with outpatient primary doctor and may also follow-up with neurology.  If any symptoms change or worsen acutely, please return to the nearest emergency department. ?

## 2021-12-31 NOTE — Telephone Encounter (Signed)
Epic reviewed patient diagnosed with Bells palsy and discharged to home with follow up neurology/PCM as outpatient and cleared to RTW tomorrow.  Work note sent to HR Tim/Tonya. ?

## 2021-12-31 NOTE — ED Notes (Signed)
Pt tx to MRI

## 2021-12-31 NOTE — Telephone Encounter (Signed)
Notified by HR Tim patient in hospital after possible stroke per his daughter today.  Epic reviewed work up ongoing MRI results pending had deficits on exam by ED provider.  DDx Bell's Palsy.   Labs essentially normal. Will follow up with patient tomorrow.  ?

## 2021-12-31 NOTE — Consult Note (Signed)
Neurology Consult H&P ? ?Juan Lewis ?MR# [id number] ?12/31/2021 ? ? ?CC: left sided face droop ? ?History is obtained from: patient, wife and chart. ? ?HPI: Juan Lewis is a 51 y.o. male PMHx as reviewed below developed sharp pain behind his left ear shooting down into the right side of his neck. His wife mentioned that he had a whooshing sound in his ear. ? ?He denies dysphagia. ? ?The following information was taken from ED note 12/31/2021: ? ?"since Sunday patient has had some pain and headache in his left neck and his head and then for the last 2 days has had developing symptoms of left facial droop/numbness and left arm numbness.  He said that this morning the facial droop was severe prompting him to come in for evaluation.  He says that his speech sounded different with dysarthria but is not admitting to aphasia.  He is not reporting any vision changes.  He does not describe fevers, chills, nausea, vomiting, or other complaints.  Denies any chest pain or palpitations.  Does not report any shortness of breath.  He does not report any trauma.   ?  ?He denies any leg symptoms but on my brief exam, patient does have left facial droop including the forehead.  He is unable to close his left eye with effort.  Pupils however are symmetric and reactive normal extraocular movements.  He has intact strength in arms and legs and has normal finger-nose-finger testing.  He does report sensory difference with numbness in the left arm compared to the right.  He reports numbness in the left face compared to the right.  Good pulses in extremities.  Legs had normal strength, sensation, and pulses.  Abdomen nontender.  Chest nontender.  Back nontender. ? ?We will touch base with neurology.  Clinically this appears like a Bell's palsy with the left facial symptoms however the component of left arm symptoms and his history of malignancy with his prostate and throat cancer with neck and headache may slightly complicate the  picture.  Will discuss with neurology what type of imaging may be most beneficial for patient. ? ?Anticipate reassessment after work-up" ? ?No recent illness or head trauma. ? ?LKW: OSW ?tNK given: Not indicated ?IR Thrombectomy No, not indicated ?Modified Rankin Scale: 0-Completely asymptomatic and back to baseline post- stroke ?NIHSS: 4 Face and dysarthria ? ?ROS: A complete ROS was performed and is negative except as noted in the HPI.  ? ?Past Medical History:  ?Diagnosis Date  ? GERD (gastroesophageal reflux disease)   ? hx of  ? History of radiation therapy   ? 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 Hoarseness 2020  ? Lesion of vocal cord 2020  ? Prostate cancer [facility]) 2021  ? ? ? ?Family History  ?Problem Relation Age of Onset  ? Prostate cancer Neg Hx   ? Breast cancer Neg Hx   ? Colon cancer Neg Hx   ? Pancreatic cancer Neg Hx   ? Colon polyps Neg Hx   ? Esophageal cancer Neg Hx   ? Rectal cancer Neg Hx   ? Stomach cancer Neg Hx   ? ? ?Social History:  reports that he quit smoking about 2 years ago. His smoking use included cigarettes. He started smoking about 33 years ago. He has a 15.00 pack-year smoking history. He has never used smokeless tobacco. He reports that he does not currently use alcohol after a past usage of about 14.0 standard drinks per week.  He reports that he does not use drugs. ? ? ?Prior to Admission medications   ?Medication Sig Start Date End Date Taking? Authorizing Provider  ?silodosin (RAPAFLO) 4 MG CAPS capsule Take 4 mg by mouth at bedtime. 11/13/21  Yes [provider]  ? ? ?Exam: ?Current vital signs: ?BP 124/75   Pulse (!) 55   Temp 97.7 ?F (36.5 ?C) (Oral)   Resp 16   Ht '5\' 7"'$  (1.702 m)   Wt 73.5 kg   SpO2 100%   BMI 25.37 kg/m?  ? ?Physical Exam  ?Constitutional: Appears well-developed and well-nourished.  ?Psych: Affect appropriate to situation ?Eyes: No scleral injection ?HENT: No OP obstruction. ?Head: Normocephalic.  ?Cardiovascular: Normal  rate and regular rhythm.  ?Respiratory: Effort normal, symmetric excursions bilaterally, no audible wheezing. ?GI: Soft.  No distension. There is no tenderness.  ?Skin: WDI ? ?Neuro: ?Mental Status: ?Patient is awake, alert, oriented to person, place, month, year, and situation. ?Patient is able to give a clear and coherent history. ?Speech dysarthric but  fluent, intact comprehension and repetition. ?No signs of aphasia or neglect. ?Visual Fields are full. Pupils are equal, round, and reactive to light. ?EOMI without ptosis or diplopia.  ?Weakness of left eye closure. ?Significant decrease in eye blinking on left. ?Facial sensation is symmetric to temperature ?Left  hemifacial paralysis. ?Hearing is intact to voice. ?Uvula midline and palate elevates symmetrically. ?Shoulder shrug is symmetric. ?Tongue is midline without atrophy or fasciculations.  ?Tone is normal. Bulk is normal. 5/5 strength was present in all four extremities. ?Sensation is symmetric to light touch and temperature in the arms and legs. ?Deep Tendon Reflexes: 2+ and symmetric in the biceps and patellae. ?Toes are downgoing bilaterally. ?FNF and HKS are intact bilaterally. ?Gait - Deferred ? ?I have reviewed labs in epic and the pertinent results are: ?AP 37 ? ?I have reviewed the images obtained: ?MRI brain/face showed enhancement of the left facial nerve consistent with Bell's palsy. ? ? ?Assessment: Juan Lewis is a 51 y.o. male PMHx as noted above with complete left hemifacial paralysis which was preceded by left retroauricular headache radiating down into neck.   ? ?History and exam with paucity of eye blinking on left associated with dry, itchy left eye without dysphagia highly suggestive of idiopathic left facial nerve palsy. ? ?Impression:  ?Idiopathic facial nerve palsy. ?Keep left eye irrigated and tape it closed during sleep. ?Follow up with PCP ?Neurology will remain available, please call for questions. ? ? ?Electronically signed  by:  ?Lynnae Sandhoff, MD ?Page: [phone] ?12/31/2021, 10:01 AM ? ?If 7pm- 7am, please page neurology on call as listed in [place]. ? ?

## 2021-12-31 NOTE — ED Notes (Signed)
Pt remains in MRI 

## 2021-12-31 NOTE — ED Triage Notes (Signed)
Last night wife states pt left eye was not looking right woke up this am and whole left face did not look right. ?

## 2021-12-31 NOTE — ED Notes (Signed)
Neuro at bedside.

## 2022-01-03 NOTE — Telephone Encounter (Signed)
Spoke with patient via telephone stated he is feeling much better and plans to return to work tomorrow.  A&Ox3 spoke full sentences without difficulty.  Reminded patient RN Hildred Alamin in clinic tomorrow if he needs anything.  Patient verbalized understanding information and had no further questions at this time.  HR Tim/Tonya notified patient plans to return tomorrow. ?

## 2022-01-12 NOTE — Telephone Encounter (Signed)
Patient seen in workcenter today wearing surgical mask.  Stated feeling well denied concerns.  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  Skin warm dry and pink.  Respirations even and unlabored.  Spoke full sentences without difficulty A&Ox3.  Stated he did return to work last week. ?

## 2022-01-14 ENCOUNTER — Encounter: Payer: Self-pay | Admitting: Registered Nurse

## 2022-01-14 ENCOUNTER — Ambulatory Visit: Payer: Self-pay | Admitting: Registered Nurse

## 2022-01-14 VITALS — BP 109/77 | HR 70 | Temp 96.4°F

## 2022-01-14 DIAGNOSIS — H6592 Unspecified nonsuppurative otitis media, left ear: Secondary | ICD-10-CM

## 2022-01-14 DIAGNOSIS — H9312 Tinnitus, left ear: Secondary | ICD-10-CM

## 2022-01-14 MED ORDER — AMOXICILLIN 875 MG PO TABS: 875.0000 mg | ORAL_TABLET | Freq: Two times a day (BID) | ORAL | 0 refills | Status: AC

## 2022-01-14 NOTE — Progress Notes (Signed)
? ?Subjective:  ? ? Patient ID: Juan Lewis, male    DOB: [birth year], 51 y.o.   MRN: [id number] ? ?51y/o african [place] male established patient here for left ear ringing/pain.  Denied fever/chills/headache/discharge ear.  Some pain behind left ear also.  Was seen 12/31/21 ER and diagnosed with Bells Palsy.  Last otolaryngology appt 01/11/22 [facility]. ? ? ? ? ?Review of Systems  ?Constitutional:  Negative for activity change, appetite change, chills, diaphoresis, fatigue and fever.  ?HENT:  Positive for ear pain. Negative for congestion, dental problem, ear discharge, facial swelling, hearing loss, sinus pressure, sinus pain, sore throat, trouble swallowing and voice change.   ?Eyes:  Negative for photophobia and visual disturbance.  ?Respiratory:  Negative for cough, shortness of breath, wheezing and stridor.   ?Gastrointestinal:  Negative for diarrhea, nausea and vomiting.  ?Endocrine: Negative for cold intolerance and heat intolerance.  ?Genitourinary:  Negative for difficulty urinating.  ?Musculoskeletal:  Negative for gait problem, neck pain and neck stiffness.  ?Skin:  Negative for rash.  ?Allergic/Immunologic: Negative for food allergies.  ?Neurological:  Negative for dizziness, tremors, syncope, speech difficulty, weakness, light-headedness and headaches.  ?Hematological:  Does not bruise/bleed easily.  ?Psychiatric/Behavioral:  Negative for agitation, confusion and sleep disturbance.   ? ?   ?Objective:  ? Physical Exam ?Vitals and nursing note reviewed.  ?Constitutional:   ?   General: He is awake. He is not in acute distress. ?   Appearance: Normal appearance. He is well-developed, well-groomed and normal weight. He is not ill-appearing, toxic-appearing or diaphoretic.  ?HENT:  ?   Head: Normocephalic and atraumatic.  ?   Jaw: There is normal jaw occlusion. No trismus, tenderness, swelling or pain on movement.  ?   Salivary Glands: Right salivary gland is not diffusely enlarged or tender. Left  salivary gland is not diffusely enlarged or tender.  ?   Right Ear: Hearing, ear canal and external ear normal. No decreased hearing noted. No laceration, drainage, swelling or tenderness. A middle ear effusion is present. There is no impacted cerumen. No foreign body. No mastoid tenderness. No PE tube. No hemotympanum. Tympanic membrane is not injected, scarred, perforated, erythematous, retracted or bulging.  ?   Left Ear: Hearing, ear canal and external ear normal. No decreased hearing noted. Tenderness present. No laceration, drainage or swelling. A middle ear effusion is present. There is no impacted cerumen. No foreign body. No mastoid tenderness. No PE tube. No hemotympanum. Tympanic membrane is erythematous and bulging. Tympanic membrane is not injected, scarred, perforated or retracted.  ?   Ears:  ?   Comments: Left TM erythema/bulging air fluid level clear tenderness with otoscopic exam and post auricular/post cervical lymph node palpation; cerumen noted in bilateral canals brown/gold 3-6 oclock ?   Nose: Nose normal. No congestion or rhinorrhea.  ?   Right Sinus: No maxillary sinus tenderness or frontal sinus tenderness.  ?   Left Sinus: No maxillary sinus tenderness or frontal sinus tenderness.  ?   Comments: Cobblestoning posterior pharynx; bilateral allergic shiners ?   Mouth/Throat:  ?   Lips: Pink. No lesions.  ?   Mouth: Mucous membranes are moist. No lacerations, oral lesions or angioedema.  ?   Dentition: No gum lesions.  ?   Tongue: No lesions. Tongue does not deviate from midline.  ?   Palate: No mass and lesions.  ?   Pharynx: Uvula midline. Pharyngeal swelling and posterior oropharyngeal erythema present. No oropharyngeal exudate.  ?  Tonsils: No tonsillar exudate or tonsillar abscesses. 1+ on the right. 1+ on the left.  ?Eyes:  ?   General: Lids are normal. Vision grossly intact. Gaze aligned appropriately. Allergic shiner present. No scleral icterus.    ?   Right eye: No discharge.     ?    Left eye: No discharge.  ?   Extraocular Movements: Extraocular movements intact.  ?   Conjunctiva/sclera: Conjunctivae normal.  ?   Pupils: Pupils are equal, round, and reactive to light.  ?Neck:  ?   Trachea: Trachea and phonation normal. No tracheal deviation.  ?Cardiovascular:  ?   Rate and Rhythm: Normal rate and regular rhythm.  ?   Pulses: Normal pulses.     ?     Radial pulses are 2+ on the right side and 2+ on the left side.  ?Pulmonary:  ?   Effort: Pulmonary effort is normal. No respiratory distress.  ?   Breath sounds: Normal breath sounds and air entry. No stridor or transmitted upper airway sounds. No wheezing or rales.  ?   Comments: Spoke full sentences without difficulty; no cough observed in exam room; wearing cloth mask ?Abdominal:  ?   General: Abdomen is flat.  ?Musculoskeletal:     ?   General: Normal range of motion.  ?   Cervical back: Normal range of motion and neck supple. Tenderness present. No swelling, edema, deformity, erythema, signs of trauma, lacerations, rigidity, spasms or crepitus. No pain with movement or muscular tenderness. Normal range of motion.  ?   Thoracic back: No swelling, edema, deformity, signs of trauma, lacerations, spasms or tenderness.  ?   Right lower leg: No edema.  ?   Left lower leg: No edema.  ?Lymphadenopathy:  ?   Head:  ?   Right side of head: No submental, submandibular, tonsillar, preauricular, posterior auricular or occipital adenopathy.  ?   Left side of head: Posterior auricular adenopathy present. No submental, submandibular, tonsillar, preauricular or occipital adenopathy.  ?   Cervical: Cervical adenopathy present.  ?   Right cervical: No superficial, deep or posterior cervical adenopathy. ?   Left cervical: Posterior cervical adenopathy present. No superficial cervical adenopathy.  ?   Comments: TTP no enlarged lymph nodes or shottiness palpated  ?Skin: ?   General: Skin is warm and dry.  ?   Capillary Refill: Capillary refill takes less than 2  seconds.  ?   Coloration: Skin is not ashen, cyanotic, jaundiced, mottled, pale or sallow.  ?   Findings: No abrasion, abscess, acne, bruising, burn, ecchymosis, erythema, signs of injury, laceration, lesion, petechiae, rash or wound.  ?   Nails: There is no clubbing.  ?Neurological:  ?   Mental Status: He is alert and oriented to person, place, and time. Mental status is at baseline.  ?   Cranial Nerves: No dysarthria.  ?   Motor: Motor function is intact. No weakness, tremor, atrophy, abnormal muscle tone or seizure activity.  ?   Coordination: Coordination is intact. Coordination normal.  ?   Gait: Gait is intact. Gait normal.  ?   Comments: In/out of chair and on/off exam table without difficulty; gait sure and steady in clinic; bilateral hand grasp equal 5/5  ?Psychiatric:     ?   Attention and Perception: Attention and perception normal.     ?   Mood and Affect: Mood and affect normal.     ?   Speech: Speech normal.     ?  Behavior: Behavior normal. Behavior is cooperative.     ?   Thought Content: Thought content normal.     ?   Cognition and Memory: Cognition and memory normal.     ?   Judgment: Judgment normal.  ? ? ? ? ? ?   ?Assessment & Plan:  ?A-otitis media left with effusion and tinnitus left ear ? ?P-Supportive treatment. Amoxicillin '875mg'$  po BID x 10 days #20 RF0 dispensed from [facility] to patient  Tylenol '1000mg'$  po QID prn pain/fever.   No evidence of invasive bacterial infection, non toxic and well hydrated.  This is most likely self limiting viral infection.  I do not see where any further testing or imaging is necessary at this time.   I will suggest supportive care, rest, good hygiene and encourage the patient to take adequate fluids.  The patient is to return to clinic or EMERGENCY ROOM if symptoms worsen or change significantly e.g. ear pain, fever, purulent discharge from ears or bleeding.  Exitcare handout on otitis media and tinnitus printed and given to patient.  Patient had Bells Palsy  recently ear ringing could be related to viral infection or effusion or both left ear.  Will send to ENT if  no improvement after treatment otitis media.  Patient is established with ENT.   Patient verbalized agre

## 2022-01-14 NOTE — Patient Instructions (Signed)
Tinnitus ?Tinnitus refers to hearing a sound when there is no actual source for that sound. This is often described as ringing in the ears. However, people with this condition may hear a variety of noises, in one ear or in both ears. ?The sounds of tinnitus can be soft, loud, or somewhere in between. Tinnitus can last for a few seconds or can be constant for days. It may go away without treatment and come back at various times. When tinnitus is constant or happens often, it can lead to other problems, such as trouble sleeping and trouble concentrating. ?Almost everyone experiences tinnitus at some point. Tinnitus is not the same as hearing loss. Tinnitus that is long-lasting (chronic) or comes back often (recurs) may require medical attention. ?What are the causes? ?The cause of tinnitus is often not known. In some cases, it can result from: ?Exposure to loud noises from machinery, music, or other sources. ?An object (foreign body) stuck in the ear. ?Earwax buildup. ?Drinking alcohol or caffeine. ?Taking certain medicines. ?Age-related hearing loss. ?It may also be caused by medical conditions such as: ?Ear or sinus infections. ?Heart diseases or high blood pressure. ?Allergies. ?M?ni?re's disease. ?Thyroid problems. ?Tumors. ?A weak, bulging blood vessel (aneurysm) near the ear. ?What increases the risk? ?The following factors may make you more likely to develop this condition: ?Exposure to loud noises. ?Age. Tinnitus is more likely in older individuals. ?Using alcohol or tobacco. ?What are the signs or symptoms? ?The main symptom of tinnitus is hearing a sound when there is no source for that sound. It may sound like: ?Buzzing. ?Sizzling. ?Ringing. ?Blowing air. ?Hissing. ?Whistling. ?Other sounds may include: ?Roaring. ?Running water. ?A musical note. ?Tapping. ?Humming. ?Symptoms may affect only one ear (unilateral) or both ears (bilateral). ?How is this diagnosed? ?Tinnitus is diagnosed based on your symptoms,  your medical history, and a physical exam. Your health care provider may do a thorough hearing test (audiologic exam) if your tinnitus: ?Is unilateral. ?Causes hearing difficulties. ?Lasts 6 months or longer. ?You may work with a health care provider who specializes in hearing disorders (audiologist). You may be asked questions about your symptoms and how they affect your daily life. You may have other tests done, such as: ?CT scan. ?MRI. ?An imaging test of how blood flows through your blood vessels (angiogram). ?How is this treated? ?Treating an underlying medical condition can sometimes make tinnitus go away. If your tinnitus continues, other treatments may include: ?Therapy and counseling to help you manage the stress of living with tinnitus. ?Sound generators to mask the tinnitus. These include: ?Tabletop sound machines that play relaxing sounds to help you fall asleep. ?Wearable devices that fit in your ear and play sounds or music. ?Acoustic neural stimulation. This involves using headphones to listen to music that contains an auditory signal. Over time, listening to this signal may change some pathways in your brain and make you less sensitive to tinnitus. This treatment is used for very severe cases when no other treatment is working. ?Using hearing aids or cochlear implants if your tinnitus is related to hearing loss. Hearing aids are worn in the outer ear. Cochlear implants are surgically placed in the inner ear. ?Follow these instructions at home: ?Managing symptoms ?  ?When possible, avoid being in loud places and being exposed to loud sounds. ?Wear hearing protection, such as earplugs, when you are exposed to loud noises. ?Use a white noise machine, a humidifier, or other devices to mask the sound of tinnitus. ?Practice techniques  for reducing stress, such as meditation, yoga, or deep breathing. Work with your health care provider if you need help with managing stress. ?Sleep with your head slightly  raised. This may reduce the impact of tinnitus. ?General instructions ?Do not use stimulants, such as nicotine, alcohol, or caffeine. Talk with your health care provider about other stimulants to avoid. Stimulants are substances that can make you feel alert and attentive by increasing certain activities in the body (such as heart rate and blood pressure). These substances may make tinnitus worse. ?Take over-the-counter and prescription medicines only as told by your health care provider. ?Try to get plenty of sleep each night. ?Keep all follow-up visits. This is important. ?Contact a health care provider if: ?Your tinnitus continues for 3 weeks or longer without stopping. ?You develop sudden hearing loss. ?Your symptoms get worse or do not get better with home care. ?You feel you are not able to manage the stress of living with tinnitus. ?Get help right away if: ?You develop tinnitus after a head injury. ?You have tinnitus along with any of the following: ?Dizziness. ?Nausea and vomiting. ?Loss of balance. ?Sudden, severe headache. ?Vision changes. ?Facial weakness or weakness of arms or legs. ?These symptoms may represent a serious problem that is an emergency. Do not wait to see if the symptoms will go away. Get medical help right away. Call your local emergency services (911 in the [place] Do not drive yourself to the hospital. ?Summary ?Tinnitus refers to hearing a sound when there is no actual source for that sound. This is often described as ringing in the ears. ?Symptoms may affect only one ear (unilateral) or both ears (bilateral). ?Use a white noise machine, a humidifier, or other devices to mask the sound of tinnitus. ?Do not use stimulants, such as nicotine, alcohol, or caffeine. These substances may make tinnitus worse. ?This information is not intended to replace advice given to you by your health care provider. Make sure you discuss any questions you have with your health care provider. ?Document  Revised: 09/01/2020 Document Reviewed: 09/01/2020 ?Elsevier Patient Education ? [place]. ?Otitis Media, Adult ?Otitis media occurs when there is inflammation and fluid in the middle ear with signs and symptoms of an acute infection. The middle ear is a part of the ear that contains bones for hearing as well as air that helps send sounds to the brain. When infected fluid builds up in this space, it causes pressure and can lead to an ear infection. The eustachian tube connects the middle ear to the back of the nose (nasopharynx) and normally allows air into the middle ear. If the eustachian tube becomes blocked, fluid can build up and become infected. ?What are the causes? ?This condition is caused by a blockage in the eustachian tube. This can be caused by mucus or by swelling of the tube. Problems that can cause a blockage include: ?A cold or other upper respiratory infection. ?Allergies. ?An irritant, such as tobacco smoke. ?Enlarged adenoids. The adenoids are areas of soft tissue located high in the back of the throat, behind the nose and the roof of the mouth. They are part of the body's defense system (immune system). ?A mass in the nasopharynx. ?Damage to the ear caused by pressure changes (barotrauma). ?What increases the risk? ?You are more likely to develop this condition if you: ?Smoke or are exposed to tobacco smoke. ?Have an opening in the roof of your mouth (cleft palate). ?Have gastroesophageal reflux. ?Have an immune system  disorder. ?What are the signs or symptoms? ?Symptoms of this condition include: ?Ear pain. ?Fever. ?Decreased hearing. ?Tiredness (lethargy). ?Fluid leaking from the ear, if the eardrum is ruptured or has burst. ?Ringing in the ear. ?How is this diagnosed? ?This condition is diagnosed with a physical exam. During the exam, your health care provider will use an instrument called an otoscope to look in your ear and check for redness, swelling, and fluid. He or she will also  ask about your symptoms. ?Your health care provider may also order tests, such as: ?A pneumatic otoscopy. This is a test to check the movement of the eardrum. It is done by squeezing a small amount of air int

## 2022-02-23 ENCOUNTER — Ambulatory Visit: Payer: Self-pay | Admitting: *Deleted

## 2022-02-23 ENCOUNTER — Other Ambulatory Visit: Payer: Self-pay | Admitting: Registered Nurse

## 2022-02-23 VITALS — BP 108/69 | Ht 69.0 in | Wt 158.0 lb

## 2022-02-23 DIAGNOSIS — R7303 Prediabetes: Secondary | ICD-10-CM

## 2022-02-24 LAB — CMP12+LP+TP+TSH+6AC+PSA+CBC…
ALT: 14 IU/L (ref 0–44)
AST: 19 IU/L (ref 0–40)
Albumin/Globulin Ratio: 2.3 — ABNORMAL HIGH (ref 1.2–2.2)
Albumin: 5 g/dL — ABNORMAL HIGH (ref 3.8–4.9)
Alkaline Phosphatase: 37 IU/L — ABNORMAL LOW (ref 44–121)
BUN/Creatinine Ratio: 14 (ref 9–20)
BUN: 12 mg/dL (ref 6–24)
Basophils Absolute: 0 10*3/uL (ref 0.0–0.2)
Basos: 1 %
Bilirubin Total: 0.3 mg/dL (ref 0.0–1.2)
Calcium: 9.7 mg/dL (ref 8.7–10.2)
Chloride: 105 mmol/L (ref 96–106)
Chol/HDL Ratio: 3 ratio (ref 0.0–5.0)
Cholesterol, Total: 175 mg/dL (ref 100–199)
Creatinine, Ser: 0.88 mg/dL (ref 0.76–1.27)
EOS (ABSOLUTE): 0 10*3/uL (ref 0.0–0.4)
Eos: 1 %
Estimated CHD Risk: <0.5 times avg. (ref 0.0–1.0)
Free Thyroxine Index: 1.5 (ref 1.2–4.9)
GGT: 30 IU/L (ref 0–65)
Globulin, Total: 2.2 g/dL (ref 1.5–4.5)
Glucose: 86 mg/dL (ref 70–99)
HDL: 58 mg/dL (ref >39)
Hematocrit: 40.9 % (ref 37.5–51.0)
Hemoglobin: 13.2 g/dL (ref 13.0–17.7)
Immature Grans (Abs): 0 10*3/uL (ref 0.0–0.1)
Immature Granulocytes: 0 %
Iron: 114 ug/dL (ref 38–169)
LDH: 184 IU/L (ref 121–224)
LDL Chol Calc (NIH): 107 mg/dL — ABNORMAL HIGH (ref 0–99)
Lymphocytes Absolute: 1.8 10*3/uL (ref 0.7–3.1)
Lymphs: 54 %
MCH: 29.7 pg (ref 26.6–33.0)
MCHC: 32.3 g/dL (ref 31.5–35.7)
MCV: 92 fL (ref 79–97)
Monocytes Absolute: 0.3 10*3/uL (ref 0.1–0.9)
Monocytes: 10 %
Neutrophils Absolute: 1.2 10*3/uL — ABNORMAL LOW (ref 1.4–7.0)
Neutrophils: 34 %
Phosphorus: 3.6 mg/dL (ref 2.8–4.1)
Platelets: 222 10*3/uL (ref 150–450)
Potassium: 4.2 mmol/L (ref 3.5–5.2)
Prostate Specific Ag, Serum: 0.8 ng/mL (ref 0.0–4.0)
RBC: 4.45 x10E6/uL (ref 4.14–5.80)
RDW: 13.7 % (ref 11.6–15.4)
Sodium: 143 mmol/L (ref 134–144)
T3 Uptake Ratio: 26 % (ref 24–39)
T4, Total: 5.9 ug/dL (ref 4.5–12.0)
TSH: 3.23 u[IU]/mL (ref 0.450–4.500)
Total Protein: 7.2 g/dL (ref 6.0–8.5)
Triglycerides: 50 mg/dL (ref 0–149)
Uric Acid: 6.2 mg/dL (ref 3.8–8.4)
VLDL Cholesterol Cal: 10 mg/dL (ref 5–40)
WBC: 3.4 10*3/uL (ref 3.4–10.8)
eGFR: 103 mL/min/{1.73_m2} (ref >59)

## 2022-02-24 LAB — HGB A1C W/O EAG: Hgb A1c MFr Bld: 5.7 % — ABNORMAL HIGH (ref 4.8–5.6)

## 2022-02-27 ENCOUNTER — Encounter: Payer: Self-pay | Admitting: Registered Nurse

## 2022-02-27 NOTE — Addendum Note (Signed)
Addended by: Gerarda Fraction A on: 02/27/2022 09:22 AM   Modules accepted: Orders, Level of Service

## 2022-03-01 NOTE — Progress Notes (Signed)
Results reviewed with pt in clinic 5/19. A1c elevated prediabetes, but improved from last year. Albumin slightly elevated and alkaline phosphatase decreased, same as previous year. LDL mildly elevated. Diet and exercise recommendations discussed re: health maintenance, A1c, LDL. No PCP established. Copy of labs provided to pt. No further questions/concerns.

## 2022-03-01 NOTE — Progress Notes (Signed)
Reviewed RN Haley note agreed with plan of care. 

## 2022-03-01 NOTE — Progress Notes (Signed)
[employer] insurance premium discount evaluation: Labs Drawn by [facility] onsite. Replacements ROI form signed. Tobacco Free Attestation form signed.  Forms placed in paper chart.

## 2022-06-17 ENCOUNTER — Ambulatory Visit: Payer: BC Managed Care – PPO | Admitting: Registered Nurse

## 2022-06-17 ENCOUNTER — Encounter: Payer: Self-pay | Admitting: Registered Nurse

## 2022-06-17 VITALS — BP 105/62 | HR 74 | Temp 97.2°F

## 2022-06-17 DIAGNOSIS — R61 Generalized hyperhidrosis: Secondary | ICD-10-CM

## 2022-06-17 MED ORDER — ACETAMINOPHEN 500 MG PO TABS: 1000.0000 mg | ORAL_TABLET | Freq: Four times a day (QID) | ORAL | 0 refills | Status: AC | PRN

## 2022-06-17 MED ORDER — LORATADINE 10 MG PO TABS: 10.0000 mg | ORAL_TABLET | Freq: Every day | ORAL | 0 refills | Status: AC | PRN

## 2022-06-17 NOTE — Patient Instructions (Addendum)
Preventing Heat Exhaustion, Adult Heat exhaustion happens when your body gets too hot (overheated) from hot weather, exercise, strenuous physical activity, dehydration, or a combination of these. If untreated, heat exhaustion could lead to heat stroke, which is a medical emergency and can be life-threatening. How can heat exhaustion affect me? Early warning signs of heat exhaustion include: Weakness. Fatigue. Heat rash. This red, blotchy skin rash is also called prickly heat. Swelling of the legs and feet. Stomach cramps. Arm pain. Leg cramps. When heat exhaustion symptoms progress, they can include: Heavy sweating. Body temperature of 102.22F (39C) or higher. Clammy skin. Rapid, weak pulse. Nausea or vomiting. Dizziness. Headache. Difficulty focusing or concentrating. Fainting. If you have signs of heat exhaustion, move to a cool place, loosen your clothing, and drink water or a sports drink. Then do these things: Put cool, wet compresses on your body or get into a cool bath or shower. Remove all unnecessary clothing to expose as much skin as possible to ventilated air. Fans and cool water mists over your skin are very important. What can increase my risk? People who work or exercise outside in hot weather have the highest risk of heat exhaustion. You may also be at higher risk if you: Are over age 75. Older adults have a greater risk for heat exhaustion than younger adults. Live alone, especially in a home without air conditioning or proper ventilation. Are very overweight (obese). Have high blood pressure. Have certain chronic medical conditions, such as heart disease, poor circulation or other vascular diseases, sickle cell disease, high blood pressure, diabetes, lung disease, or cystic fibrosis, or you have had a stroke. Take certain medicines for high blood pressure, or you take antihistamines or tranquilizers. Drink alcohol excessively or use drugs, such as cocaine, heroin, or  amphetamines. What actions can I take to prevent heat exhaustion? Eating and drinking     Drink enough water or sports drinks to keep your urine pale yellow. When it is hot, drink every 15 to 20 minutes, even if you are not thirsty. Do not go out in the heat after a heavy meal. Do not drink alcohol or caffeinated drinks when it is very hot outside. Lifestyle Avoid being outside on very hot days. Check your local news for extreme heat alerts or warnings. In extreme heat, stay in an air-conditioned environment until the temperature cools off. Wear lightweight, light-colored, and loose-fitting clothing in warm weather. This allows for better air circulation over the skin. Protect yourself from the sun by wearing a broad-brimmed hat and using a broad-spectrum sunscreen that is SPF 15 or higher. Activity Check with your health care provider before starting any new exercise or activity. Ask about any health conditions or medicines that might increase your risk for heat exhaustion. Do outdoor activities when it is cooler. This may be in the morning, late afternoon, or evening. Take breaks in the shade. Do not work or exercise in the heat when you feel unwell or have been sick. Start any new work or exercise activity gradually. General information Older adults need extra precautions to protect against heat exhaustion. Follow these tips: Do not leave an older adult alone in a hot car. Make sure older adults have access to air conditioning on very hot days. Remind them to drink enough fluids. Check on them at least twice a day if you can. Where to find more information Centers for Disease Control and Prevention: http://www.wolf.info/ American Academy of Family Physicians: familydoctor.[place] [employer]: orthoinfo.aaos.org  Contact a health care provider if you: Faint. Feel weak or dizzy. Have any signs or symptoms of heat exhaustion that last more than 1 hour. These may include  muscle cramps, fatigue, redness of the face and neck, and headache. Get help right away if you: Have signs of heat stroke, including: Body temperature of 104F (40C) or higher. Hot, dry, red skin. Fast, thumping pulse. Severe nausea, vomiting, or both. Confusion. These symptoms may represent a serious problem that is an emergency. Do not wait to see if the symptoms will go away. Get medical help right away. Call your local emergency services (911 in the [place] Do not drive yourself to the hospital. Summary Heat exhaustion happens when your body gets too hot (overheated) from hot weather, exercise, strenuous physical activity, dehydration, or a combination of these. Avoid being outside on very hot days. When you are out in the heat, stay hydrated, wear light clothing, and take breaks in shaded areas to cool down. If you have signs of heat exhaustion, get out of the heat, drink fluids, take steps to cool down, and contact a health care provider. Cooling down may include taking off all unnecessary clothing, using cooling fans, and water misting. Make sure you are drinking fluids, such as water or sports drinks with electrolytes. Contact a health care provider if you have signs or symptoms of heat exhaustion that last more than 1 hour, such as muscle cramps, fatigue, and headache. This information is not intended to replace advice given to you by your health care provider. Make sure you discuss any questions you have with your health care provider. Document Revised: 03/24/2020 Document Reviewed: 03/24/2020 Elsevier Patient Education  [place]. Allergic Rhinitis, Adult  Allergic rhinitis is an allergic reaction that affects the mucous membrane inside the nose. The mucous membrane is the tissue that produces mucus. There are two types of allergic rhinitis: Seasonal. This type is also called hay fever and happens only during certain seasons. Perennial. This type can happen at any time of  the year. Allergic rhinitis cannot be spread from person to person. This condition can be mild, moderate, or severe. It can develop at any age and may be outgrown. What are the causes? This condition is caused by allergens. These are things that can cause an allergic reaction. Allergens may differ for seasonal allergic rhinitis and perennial allergic rhinitis. Seasonal allergic rhinitis is triggered by pollen. Pollen can come from grasses, trees, and weeds. Perennial allergic rhinitis may be triggered by: Dust mites. Proteins in a pet's urine, saliva, or dander. Dander is dead skin cells from a pet. Smoke, mold, or car fumes. What increases the risk? You are more likely to develop this condition if you have a family history of allergies or other conditions related to allergies, including: Allergic conjunctivitis. This is inflammation of parts of the eyes and eyelids. Asthma. This condition affects the lungs and makes it hard to breathe. Atopic dermatitis or eczema. This is long term (chronic) inflammation of the skin. Food allergies. What are the signs or symptoms? Symptoms of this condition include: Sneezing or coughing. A stuffy nose (nasal congestion), itchy nose, or nasal discharge. Itchy eyes and tearing of the eyes. A feeling of mucus dripping down the back of your throat (postnasal drip). Trouble sleeping. Tiredness or fatigue. Headache. Sore throat. How is this diagnosed? This condition may be diagnosed with your symptoms, medical history, and physical exam. Your health care provider may check for related conditions, such as: Asthma.  Pink eye. This is eye inflammation caused by infection (conjunctivitis). Ear infection. Upper respiratory infection. This is an infection in the nose, throat, or upper airways. You may also have tests to find out which allergens trigger your symptoms. These may include skin tests or blood tests. How is this treated? There is no cure for this  condition, but treatment can help control symptoms. Treatment may include: Taking medicines that block allergy symptoms, such as corticosteroids and antihistamines. Medicine may be given as a shot, nasal spray, or pill. Avoiding any allergens. Being exposed again and again to tiny amounts of allergens to help you build a defense against allergens (immunotherapy). This is done if other treatments have not helped. It may include: Allergy shots. These are injected medicines that have small amounts of allergen in them. Sublingual immunotherapy. This involves taking small doses of a medicine with allergen in it under your tongue. If these treatments do not work, your health care provider may prescribe newer, stronger medicines. Follow these instructions at home: Avoiding allergens Find out what you are allergic to and avoid those allergens. These are some things you can do to help avoid allergens: If you have perennial allergies: Replace carpet with wood, tile, or vinyl flooring. Carpet can trap dander and dust. Do not smoke. Do not allow smoking in your home. Change your heating and air conditioning filters at least once a month. If you have seasonal allergies, take these steps during allergy season: Keep windows closed as much as possible. Plan outdoor activities when pollen counts are lowest. Check pollen counts before you plan outdoor activities. When coming indoors, change clothing and shower before sitting on furniture or bedding. If you have a pet in the house that produces allergens: Keep the pet out of the bedroom. Vacuum, sweep, and dust regularly. General instructions Take over-the-counter and prescription medicines only as told by your health care provider. Drink enough fluid to keep your urine pale yellow. Keep all follow-up visits as told by your health care provider. This is important. Where to find more information American Academy of Allergy, Asthma & Immunology:  www.aaaai.org Contact a health care provider if: You have a fever. You develop a cough that does not go away. You make whistling sounds when you breathe (wheeze). Your symptoms slow you down or stop you from doing your normal activities each day. Get help right away if: You have shortness of breath. This symptom may represent a serious problem that is an emergency. Do not wait to see if the symptom will go away. Get medical help right away. Call your local emergency services (911 in the [place] Do not drive yourself to the hospital. Summary Allergic rhinitis may be managed by taking medicines as directed and avoiding allergens. If you have seasonal allergies, keep windows closed as much as possible during allergy season. Contact your health care provider if you develop a fever or a cough that does not go away. This information is not intended to replace advice given to you by your health care provider. Make sure you discuss any questions you have with your health care provider. Document Revised: 11/16/2019 Document Reviewed: 09/25/2019 Elsevier Patient Education  [place]. Viral Respiratory Infection A respiratory infection is an illness that affects part of the respiratory system, such as the lungs, nose, or throat. A respiratory infection that is caused by a virus is called a viral respiratory infection. Common types of viral respiratory infections include: A cold. The flu (influenza). A respiratory syncytial virus (  RSV) infection. What are the causes? This condition is caused by a virus. The virus may spread through contact with droplets or direct contact with infected people or their mucus or secretions. The virus may spread from person to person (is contagious). What are the signs or symptoms? Symptoms of this condition include: A stuffy or runny nose. A sore throat or cough. Shortness of breath or difficulty breathing. Yellow or green mucus (sputum). Other symptoms may  include: A fever. Sweating or chills. Fatigue. Achy muscles. A headache. How is this diagnosed? This condition may be diagnosed based on: Your symptoms. A physical exam. Testing of secretions from the nose or throat. Chest X-ray. How is this treated? This condition may be treated with medicines, such as: Antiviral medicine. This may shorten the length of time a person has symptoms. Expectorants. These make it easier to cough up mucus. Decongestant nasal sprays. Acetaminophen or NSAIDs, such as ibuprofen, to relieve fever and pain. Antibiotic medicines are not prescribed for viral infections.This is because antibiotics are designed to kill bacteria. They do not kill viruses. Follow these instructions at home: Managing pain and congestion Take over-the-counter and prescription medicines only as told by your health care provider. If you have a sore throat, gargle with a mixture of salt and water 3-4 times a day or as needed. To make salt water, completely dissolve -1 tsp (3-6 g) of salt in 1 cup (237 mL) of warm water. Use nose drops made from salt water to ease congestion and soften raw skin around your nose. Take 2 tsp (10 mL) of honey at bedtime to lessen coughing at night. Do not give honey to children who are younger than 1 year. Drink enough fluid to keep your urine pale yellow. This helps prevent dehydration and helps loosen up mucus. General instructions  Rest as much as possible. Do not drink alcohol. Do not use any products that contain nicotine or tobacco. These products include cigarettes, chewing tobacco, and vaping devices, such as e-cigarettes. If you need help quitting, ask your health care provider. Keep all follow-up visits. This is important. How is this prevented?     Get an annual flu shot. You may get the flu shot in late summer, fall, or winter. Ask your health care provider when you should get your flu shot. Avoid spreading your infection to other people.  If you are sick: Wash your hands with soap and water often, especially after you cough or sneeze. Wash for at least 20 seconds. If soap and water are not available, use alcohol-based hand sanitizer. Cover your mouth when you cough. Cover your nose and mouth when you sneeze. Do not share cups or eating utensils. Clean commonly used objects often. Clean commonly touched surfaces. Stay home from work or school as told by your health care provider. Avoid contact with people who are sick during cold and flu season. This is generally fall and winter. Contact a health care provider if: Your symptoms last for 10 days or longer. Your symptoms get worse over time. You have severe sinus pain in your face or forehead. The glands in your jaw or neck become very swollen. You have shortness of breath. Get help right away if you: Feel pain or pressure in your chest. Have trouble breathing. Faint or feel like you will faint. Have severe and persistent vomiting. Feel confused or disoriented. These symptoms may represent a serious problem that is an emergency. Do not wait to see if the symptoms will go away.  Get medical help right away. Call your local emergency services (911 in the [place] Do not drive yourself to the hospital. Summary A respiratory infection is an illness that affects part of the respiratory system, such as the lungs, nose, or throat. A respiratory infection that is caused by a virus is called a viral respiratory infection. Common types of viral respiratory infections include a cold, influenza, and respiratory syncytial virus (RSV) infection. Symptoms of this condition include a stuffy or runny nose, cough, fatigue, achy muscles, sore throat, and fevers or chills. Antibiotic medicines are not prescribed for viral infections. This is because antibiotics are designed to kill bacteria. They are not effective against viruses. This information is not intended to replace advice given to you by your  health care provider. Make sure you discuss any questions you have with your health care provider. Document Revised: 01/01/2021 Document Reviewed: 01/01/2021 Elsevier Patient Education  [place].

## 2022-06-17 NOTE — Progress Notes (Signed)
Subjective:    Patient ID: Juan Lewis, male    DOB: 1971-03-30, 51 y.o.   MRN: [id number]  51y/o african [place] male established patient here for evaluation sweating more than usual/feeling hot while working today would like temperature check.  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  Denied recent illness, cough/n/v/d, runny nose or body aches.  Stated 98 year old son sick with sore throat at home.      Review of Systems  Constitutional:  Positive for diaphoresis. Negative for activity change, appetite change and chills.  HENT:  Negative for congestion, dental problem, postnasal drip, rhinorrhea, sinus pressure, sinus pain, sneezing, trouble swallowing and voice change.   Eyes:  Negative for photophobia and visual disturbance.  Respiratory:  Negative for cough, shortness of breath, wheezing and stridor.   Cardiovascular:  Negative for chest pain.  Gastrointestinal:  Negative for diarrhea, nausea and vomiting.  Genitourinary:  Negative for difficulty urinating.  Musculoskeletal:  Negative for gait problem, myalgias, neck pain and neck stiffness.  Skin:  Negative for rash.  Allergic/Immunologic: Negative for environmental allergies and food allergies.  Neurological:  Negative for dizziness, tremors, syncope, weakness, light-headedness and headaches.  Psychiatric/Behavioral:  Negative for agitation, confusion and sleep disturbance.        Objective:   Physical Exam Vitals and nursing note reviewed.  Constitutional:      General: He is not in acute distress.    Appearance: Normal appearance. He is well-developed, well-groomed and normal weight. He is not ill-appearing, toxic-appearing or diaphoretic.  HENT:     Head: Normocephalic and atraumatic.     Jaw: There is normal jaw occlusion. No trismus.     Salivary Glands: Right salivary gland is not diffusely enlarged or tender. Left salivary gland is not diffusely enlarged or tender.     Right Ear:  Hearing, ear canal and external ear normal. A middle ear effusion is present.     Left Ear: Hearing, ear canal and external ear normal. A middle ear effusion is present.     Nose: Rhinorrhea present. No nasal deformity, septal deviation, laceration, mucosal edema or congestion. Rhinorrhea is clear.     Right Turbinates: Not enlarged, swollen or pale.     Left Turbinates: Not enlarged, swollen or pale.     Right Sinus: No maxillary sinus tenderness or frontal sinus tenderness.     Left Sinus: No maxillary sinus tenderness or frontal sinus tenderness.     Mouth/Throat:     Lips: Pink. No lesions.     Mouth: Mucous membranes are moist. Mucous membranes are not pale, not dry and not cyanotic. No lacerations, oral lesions or angioedema.     Dentition: No dental abscesses or gum lesions.     Tongue: No lesions. Tongue does not deviate from midline.     Palate: No mass and lesions.     Pharynx: Uvula midline. Pharyngeal swelling and posterior oropharyngeal erythema present. No oropharyngeal exudate or uvula swelling.     Tonsils: No tonsillar exudate or tonsillar abscesses. 0 on the right. 0 on the left.     Comments: Cobblestoning posterior pharynx; macular erythema oropharynx; sneezed twice while in clinic 20 minutes; blew nose once after home covid test performed; bilateral TMs intact air fluid level clear; nasal turbinates with scant clear yellow discharge nares scant edema Eyes:     General: Lids are normal. Vision grossly intact. Gaze aligned appropriately. Allergic shiner present. No scleral icterus.       Right  eye: No foreign body, discharge or hordeolum.        Left eye: No foreign body, discharge or hordeolum.     Extraocular Movements:     Right eye: Normal extraocular motion and no nystagmus.     Left eye: Normal extraocular motion and no nystagmus.     Conjunctiva/sclera: Conjunctivae normal.     Right eye: Right conjunctiva is not injected. No chemosis, exudate or hemorrhage.    Left  eye: Left conjunctiva is not injected. No chemosis, exudate or hemorrhage.    Pupils: Pupils are equal, round, and reactive to light. Pupils are equal.     Right eye: Pupil is round and reactive.     Left eye: Pupil is round and reactive.  Neck:     Thyroid: No thyroid mass or thyromegaly.     Trachea: Trachea and phonation normal. No tracheal tenderness or tracheal deviation.  Cardiovascular:     Rate and Rhythm: Normal rate and regular rhythm.     Pulses: Normal pulses.          Radial pulses are 2+ on the right side and 2+ on the left side.     Heart sounds: Normal heart sounds, S1 normal and S2 normal. Heart sounds not distant. No murmur heard.    No systolic murmur is present.     No diastolic murmur is present.     No friction rub. No gallop.  Pulmonary:     Effort: Pulmonary effort is normal. No respiratory distress.     Breath sounds: Normal breath sounds and air entry. No stridor, decreased air movement or transmitted upper airway sounds. No decreased breath sounds, wheezing, rhonchi or rales.     Comments: Spoke full sentences without difficulty; no cough observed in exam room Abdominal:     General: There is no distension.     Palpations: Abdomen is soft.  Musculoskeletal:        General: No tenderness. Normal range of motion.     Right shoulder: No swelling, deformity, effusion, laceration or crepitus. Normal range of motion. Normal strength.     Left shoulder: No swelling, deformity, effusion, laceration or crepitus. Normal range of motion. Normal strength.     Right elbow: No swelling, deformity, effusion or lacerations. Normal range of motion.     Left elbow: No swelling, deformity, effusion or lacerations. Normal range of motion.     Right hand: No swelling, deformity or lacerations. Normal range of motion. Normal strength. Normal capillary refill.     Left hand: No swelling, deformity or lacerations. Normal range of motion. Normal strength. Normal capillary refill.      Cervical back: Normal range of motion and neck supple. No swelling, edema, deformity, erythema, signs of trauma, lacerations, rigidity, torticollis, tenderness or crepitus. No pain with movement, spinous process tenderness or muscular tenderness. Normal range of motion.     Thoracic back: No swelling, edema, deformity, signs of trauma, lacerations, spasms or tenderness. Normal range of motion.     Right lower leg: No edema.     Left lower leg: No edema.  Lymphadenopathy:     Head:     Right side of head: No submental, submandibular, tonsillar, preauricular, posterior auricular or occipital adenopathy.     Left side of head: No submental, submandibular, tonsillar, preauricular, posterior auricular or occipital adenopathy.     Cervical: No cervical adenopathy.     Right cervical: No superficial, deep or posterior cervical adenopathy.    Left cervical:  No superficial, deep or posterior cervical adenopathy.  Skin:    General: Skin is warm and dry.     Capillary Refill: Capillary refill takes less than 2 seconds.     Coloration: Skin is not ashen, cyanotic, jaundiced, mottled, pale or sallow.     Findings: No abrasion, abscess, acne, bruising, burn, ecchymosis, erythema, signs of injury, laceration, lesion, petechiae, rash or wound.     Nails: There is no clubbing.  Neurological:     General: No focal deficit present.     Mental Status: He is alert and oriented to person, place, and time. Mental status is at baseline.     GCS: GCS eye subscore is 4. GCS verbal subscore is 5. GCS motor subscore is 6.     Cranial Nerves: Cranial nerves 2-12 are intact. No cranial nerve deficit, dysarthria or facial asymmetry.     Sensory: Sensation is intact. No sensory deficit.     Motor: Motor function is intact. No weakness, tremor, atrophy, abnormal muscle tone or seizure activity.     Coordination: Coordination is intact. Coordination normal.     Gait: Gait is intact. Gait normal.     Comments: Gait sure and  steady in clinic; in/out of chair without difficulty; bilateral hand grasp equal 5/5  Psychiatric:        Attention and Perception: Attention and perception normal.        Mood and Affect: Mood and affect normal.        Speech: Speech normal.        Behavior: Behavior normal. Behavior is cooperative.        Thought Content: Thought content normal.        Cognition and Memory: Cognition and memory normal.        Judgment: Judgment normal.           Assessment & Plan:   A-sweating increased  P-afebrile/VSS/exam with post nasal drip/otic effusion.  patient given free [place] govt issued home covid test to perform as has not tested and sick child at home.  Discussed covid on increase in community along with RSV/flu in [place] per [place] [facility] tracking past 3 weeks.  Ragweed bloom worsening allergy symptoms for many also.  Heat index over 100 this week.  Patient home covid test negative today.  Given second test free [place] govt issued home covid test to take home with him and repeat test if new/worsening symptoms in 48 hours.  Afebrile today.  Post nasal drip noted on exam and sneezed x 2 in clinic today.  Given loratadine '10mg'$  po daily 2 UD from clinic stock today trial.  Discussed if helping to buy OTC and continue during fall allergy season/ragweed bloom.  Consider nasal saline spray 2 sprays each nostril q2h wa  prn congestion/rhinitis.  May come to clinic to get bottle as needed for personal use.  Tylenol '1000mg'$  po q6h prn fever/pain given 4 UD from clinic stock today.  Given 1 16oz bottle water from clinic stock to drink while sitting in clinic for covid test results.  Patient tolerated without difficulty.  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  Patient to seek re-evaluation if new or worsening  symptoms.  Discussed if white spots noted in back of mouth, new symptoms or positive covid test in  48 hours patient to notify me at [email or web address].  [employer]care handouts allergic rhinitis, preventing heat exhaustion and viral URI.  Patient verbalized understanding information/instructions, agreed with plan of care and had no further questions at this time.

## 2022-06-18 ENCOUNTER — Ambulatory Visit: Payer: BC Managed Care – PPO

## 2022-06-18 ENCOUNTER — Telehealth: Payer: Self-pay | Admitting: Registered Nurse

## 2022-06-18 DIAGNOSIS — U071 COVID-19: Secondary | ICD-10-CM

## 2022-06-18 MED ORDER — NIRMATRELVIR/RITONAVIR (PAXLOVID)TABLET: 3.0000 | ORAL_TABLET | Freq: Two times a day (BID) | ORAL | 0 refills | Status: AC

## 2022-06-18 NOTE — Telephone Encounter (Signed)
Notified by RN Darletta Moll that patient had positive covid test this am and was sent home from work.  Patient stated he was scheduling appt with his PCM to RN Stone.  Attempted to reach patient via telephone no answer left voicemail that I would attempt to contact him again later today.  HR Tim notified patient with positive covid test, sent home by RN Stone to start 10 day quarantine.  I was unable pull up his supervisor telephone number Helmut Muster to notify her patient was instructed to go home and start quarantine by [employer].  HR Tim stated he would notify patient supervisor.

## 2022-06-18 NOTE — Patient Instructions (Signed)
At Home Covid test administered.  Pt tolerated well.  Instructed pt on how to perform test for future reference.  Pt verbalized understanding.  Covid test POSITIVE.  Gerarda Fraction, NP notified.

## 2022-06-18 NOTE — Telephone Encounter (Signed)
HR [employer] team sent email notification patient with infectious disease.  Test results confirmed by clinic staff.  Patient sent home today by RN Stone due to positive covid test in clinic today 9/8 Day 0.  Symptoms started yesterday covid test negative yesterday 9/7 feeling hot without fever.  Day 0 today  Day 5 9/13 expected RTW 9/14  Day 10 9/18   Tim confirmed he notified supervisor Helmut Muster for me as I was in poor cell service area at time of initial test results notifiecation  Son sick contact.  Recommended that all of his family members be tested since son sick before his symptoms began.  Patient began quarantine at home after positive test.  Stated sore throat and chills continue from yesterday.  Patient contacted by NP via telephone at 1112.  Stated he does have appt with PCM this afternoon 2pm scheduled.  Discussed I can send in Rx for him for paxlovid treatment.  Patient would like to start medication.  Electronic Rx sent to [facility] on covid quarantine and paxlovid sent to patient my chart account.  Patient asked handouts be sent to his gmail account also [email or web address] as unable to get into his my chart account.  FDA EUA paxlovid patient handout emailed to patient.  Discussed after reviewing message received from pharmacy patient has rx for viagra and flomax that he should not take viagra at all when on paxlovid.  Patient stated he is not taking flomax/viagra/rapaflo only tylenol and claritin he received from me yesterday.   Discussed most common side effects metallic/bad taste in mouth, GI upset.   Patient did not develop symptoms of  trouble breathing, chest pain, nausea, vomiting, diarrhea, HA, body aches, or fever.   5 day quarantine per [facility] recommendations. Day 1 of quarantine 06/19/22. Patient to contact me if vomiting after coughing or unable to tolerate po fluids.   If GI upset I have recommended clear fluids then bland diet.  Avoid dairy/spicy,  fried and large portions of meat while having nausea.  If vomiting hold po intake x 1 hour.  Then sips clear fluids like broths, ginger ale, power ade, gatorade, pedialyte may advance to soft/bland if no vomiting x 24 hours and appetite returned otherwise hydration main focus. Email me at [email or web address] if symptoms not improved with plan of care.  Patient to go to ER if marked weakness, fainting, difficulty breathing/blue lips/face.  Patient to isolate in own room and if possible use only one bathroom if living with others in home.  Wear mask when out of room to help prevent spread to others in household.  Sanitize high touch surfaces with lysol/chlorox/bleach spray or wipes daily as viruses are known to live on surfaces from 24 hours to days.  Patient has tylenol and loratadine at home for cough/cold use and is working for him. May use flonase nasal 83mg 1 spray each nostril BID prn rhinitis.  Dayquil and nyquil per manufacturer instructions.  Discussed may use otc cough medication.  Discussed honey 1 tablespoon every 4 hours is a natural cough suppressant but caution due to his diabetes.  Avoid dehydration and drink water to keep urine pale yellow clear and voiding every 2-4 hours while awake.  Patient alert and oriented x3, spoke full sentences without difficulty.   No throat clearing, nasal congestion or cough audible during 7 minute telephone call.  Discussed with patient he can contact me at [email or web address] this weekend as clinic closed starting at noon today until Monday if  questions or concerns until RN Joaquim Lai returns to clinic on Monday 06/21/22 [phone].   Pt verbalized understanding and agreement with plan of care. No further questions/concerns at this time. Pt reminded to contact clinic with any changes in symptoms or questions/concerns. HR notified patient to work remote/quarantine through Day 5 RTW estimated Day 6 with strict mask wear through Day 10 and no eating in employee lunch room.   Estimated return to work onsite 06/24/2022   "[place] need to wear a mask when around others for 10 days.  You cannot return to work until 14 Sep.  You must wear a mask and not eat in the employee lunchroom through 18 September.  You can drink at your workstation through a straw under your mask between 14-18 Sep.  If you are having diarrhea, fever or vomiting please notify me and if fever/vomiting/diarrhea on 13 September you must stay at home until it has stopped for 24 hours.  It is not uncommon to have metallic/bad taste in mouth while taking paxlovid.  Some people have nausea/diarrhea with covid or while taking paxlovid also.  If you are vomiting hold oral intake (eating/drinking) one hour.  Contact me via this email as I can send in prescription for medication to help with nausea and vomiting also.  Ensure drinking water to keep urine pale yellow/clear and peeing every 2-4 hours when awake.  If fever/diarrhea/vomiting your body will need more water.  If cough develops you may take over the counter cough medicine or honey 1 tablespoon every 4 hours.  If these not helping let me know and I will send in prescription for you for cough.  If having blue lips/face, trouble breathing or if you get dizzy and pass out/faint I recommend going to ER for evaluation this weekend.  I recommend spraying lysol/clorox or use bleach wipes on faucet/door handles and countertops once a day to help prevent spread of infection to your family along with your isolating into your own room/wearing mask when around others.  Your family members should test for covid today also especially since your son had symptoms first.  Please let me know if you have any further questions.   Fact Sheet for Patients, Parents, and Caregivers Emergency Use Authorization (EUA) of Paxlovid for Coronavirus Disease 2019 (COVID-19) (TruckOr.si)   Person Under Monitoring Name: [facility]   Location: [address] [place]  [address]     Infection Prevention Recommendations for Individuals Confirmed to have, or Being Evaluated for, 2019 Novel Coronavirus (COVID-19) Infection Who Receive Care at Home   Individuals who are confirmed to have, or are being evaluated for, COVID-19 should follow the prevention steps below until a healthcare provider or local or state health department says they can return to normal activities.   Stay home except to get medical care You should restrict activities outside your home, except for getting medical care. Do not go to work, school, or public areas, and do not use public transportation or taxis. (Continued see my chart message covid handount copied and sent to patient personal email)

## 2022-06-23 NOTE — Telephone Encounter (Signed)
Patient contacted via telephone stated he is still feeling weak and like his heart is beating a bit faster than his baseline.  Denied fever/chills/n/v/d.  Tolerating po intake without difficulty.  Finished paxlovid yesterday.  Has follow up appt with his PCM on Friday 15 Sep scheduled.  Patient does not feel able to return to work tomorrow.  Discussed with patient I would notify Tim/Tonya HR today that he is not feeling well still has appt scheduled with PCM 15 Sep and does not plan to be at work tomorrow.  Encouraged patient to hydrate/rest and eat regular meals.  Discussed if feeling like heart racing or skipping beats he should contact his PCM/be seen for evaluation same day.  Patient A&Ox3 spoke full sentences without difficulty.  No audible cough/congestion/throat clearing/wheezing/dyspnea during 4 minute call.  Discussed I will follow up with him again this weekend via telephone.  Patient reported son improved and on other family members sick at home at this time.  Patient verbalized understanding information/instructions, agreed with plan of care and had no further questions at this time.  HR Tim/Tonya notified patient has appt with PCM 15 Sep and not planning to be at work tomorrow due to feeling weak.

## 2022-06-27 NOTE — Telephone Encounter (Signed)
Epic reviewed noted patient was given Rx for prednisone '60mg'$  po daily x 7 days from [facility] office.  No office note in care everywhere available for review at this time.  Patient no answer cell phone this evening.

## 2022-06-28 NOTE — Telephone Encounter (Signed)
Notified by [employer] patient reported to work today without mask wear.  Was notified by supervisor to wear mask and clinic staff contacted.  Notified supervisor to remind patient no eating in employee lunch room today may eat in privacy/relaxation rooms across from clinic with door closed, outside tables or in his car today.  Day 10 06/28/22.  Will attempt follow up with patient via telephone again today or if he has questions concerns can see RN Stone in clinic today.  Will follow up with patient in person tomorrow when onsite.  Message left for RN Stone.

## 2022-06-29 ENCOUNTER — Encounter: Payer: Self-pay | Admitting: Registered Nurse

## 2022-06-29 ENCOUNTER — Ambulatory Visit: Payer: BC Managed Care – PPO | Admitting: Registered Nurse

## 2022-06-29 VITALS — HR 70 | Resp 16 | Wt 152.0 lb

## 2022-06-29 DIAGNOSIS — R634 Abnormal weight loss: Secondary | ICD-10-CM

## 2022-06-29 DIAGNOSIS — R7303 Prediabetes: Secondary | ICD-10-CM

## 2022-06-29 NOTE — Patient Instructions (Signed)
Cancer Survivorship After Treatment is Completed Define the steps cancer patients can take to be healthy as treatment completes. To view the content, go to this web address: [email or web address]  This video will expire on: 04/02/2024. If you need access to this video following this date, please reach out to the healthcare provider who assigned it to you. This information is not intended to replace advice given to you by your health care provider. Make sure you discuss any questions you have with your health care provider. Elsevier Patient Education  [place] and Nutrition What you eat both during and after your cancer treatment plays a big part in your recovery. During treatment, eating well helps you stay strong, provides you with energy for tissue healing, and helps you fight infection. It may also help with side effects. When treatment ends and you start feeling better, eating well helps you rebuild tissue, gain strength and energy, and feel better overall. Choosing what is best to eat and drink during and after treatment can be a challenge. If you need help, ask to be referred to a registered dietitian. A dietitian can help you create a balanced eating plan. How to maintain healthy nutrition during cancer treatment What are tips for eating during treatment? During treatment, your tastes may change, so focus on foods that taste good to you. Eat your largest meal when you feel the most hungry. Limit drinking during the meal to increase food intake. Drink between meals to prevent dehydration. If you have lost your appetite or cannot keep food down, your cancer care team may suggest that you consume foods or drinks that have had nutrients added to them (are fortified) or are higher in calories, fat, and protein to help prevent weight loss. If you are still not able to meet your nutrition needs, you may benefit from other forms of nutrition support. Your care  team may suggest appetite stimulants, tube feeding, or nutrition through an IV inserted into one of your veins. What foods should I eat during treatment?  Appetite is often altered during treatment. Some will have low appetite due to side effects of treatment. Others may gain weight due to medicines, less activity, and stress. Talk with a dietitian to help create the best eating plan for your situation. For a few meals each week, try eating plant-based foods that are high in protein, such as beans and peas, instead of meat. Include lots of whole grains, fruits, and vegetables. Eat at least 2 cups of fruits and vegetables a day. Include fresh fruits and dark-green and deep-yellow vegetables, which are full of natural, healthy substances. Remember to wash fresh produce thoroughly before eating to reduce any risk for infection. Throughout the day, eat small snacks that are high in calories and protein. High-protein snacks that are easy to prepare and eat include: Yogurt. Cereal and milk. Half of a sandwich. A bowl of hearty soup. Cheese and crackers. Avoid snacks that could make side effects from treatment worse. For instance, popcorn, raw fruits, and raw vegetables can make diarrhea worse. Dry, coarse snacks or foods high in acid can hurt your throat if it is already sore. Drink enough fluid to keep your urine pale yellow. Talk with your health care provider about taking any vitamins and supplements, as high doses of some can alter the effectiveness of treatment therapy. How to maintain healthy nutrition after cancer treatment The goals of your nutrition after cancer treatment are to achieve and maintain a healthy weight and  to be physically active on a regular basis. What are tips for eating after treatment? After cancer treatment, eat plenty of proteins, carbohydrates, and fats. You can get each of these dietary elements from a wide range of foods. You may also want to eat a diet rich in  plant-based foods because those have been shown to be beneficial for cancer survivors. What foods should I eat after treatment? Here are some types of foods that you should include in your diet after treatment: Proteins. Protein is needed for growth, repairing body tissue and muscles, fighting infection, and helping to keep your body's defense system (immune system) healthy. When planning meals, choose lean proteins that do not have a lot of saturated fats. Include fish, lean meat, skinless poultry, eggs, nonfat and low-fat dairy products, nuts, seeds, and legumes (like beans, peas, and lentils) in your daily diet. Carbohydrates. Carbohydrates are your body's main source of energy. They provide the fuel you need for physical activity and for your organs to work well. Most of the carbohydrates in your diet should come from foods that are high in essential nutrients and fiber, such as vegetables, fruits, whole grains, and legumes. Fruits and vegetables. Eat at least 2-3 cups of vegetables and 1-2 cups of fruits each day. You may choose to eat them fresh, frozen, canned, raw, cooked, or dried. If you want to heat the fruits or vegetables, consider microwaving or steaming them rather than boiling them in water. Microwaving and steaming keep their nutrients better. Dairy. Choose low-fat milk and other low-fat dairy products. Fats and oils. Fats help boost energy in your body. Choose healthy fats from nuts, seeds, avocados, fatty fish, and vegetable oils, including olive, avocado, canola, peanut, safflower, grapeseed, and sesame. Beverages. All your body's cells need water to work well, so drink plenty of water. Drink about eight 8-oz glasses of liquid each day so your body's cells get the fluid they need. Keep in mind that foods such as soups, milk, and even ice cream count toward that goal. The items listed above may not be a complete list of foods and beverages you can eat. Contact a dietitian for  more information. What foods and beverages should I avoid or limit? Maintaining a healthy weight is important for life after treatment. Check ingredients and nutrition facts on packaged foods and beverages. Talk with a registered dietitian to create a specific eating plan for you. Follow these recommendations for general healthy eating: Avoid beverages that are high in sugar, including juices. Avoiding these will help you stay at a healthy weight and help with blood sugar control. Limit high-fat foods, including fried foods and foods that come from animals. An example of a high-fat food from an animal is red meat, processed meats, or full-fat dairy. If you have diarrhea from treatment, fatty foods can make it worse. Limit foods that are high in added sugar, such as cookies, baked goods, and candy. Limit salt-cured, smoked, and pickled foods. Avoid alcohol. There is a link between alcohol and the risk of certain cancers. If you choose to drink alcohol, it is best to limit intake to 1 drink a day for nonpregnant women and 2 drinks a day for men. In the [place] one drink equals one 12 oz bottle of beer (355 mL), one 5 oz glass of wine (148 mL), or one 1 oz glass of hard liquor (44 mL). Depending on your cancer diagnosis, your health care provider may recommend that you do not drink alcohol at all.  Limit foods that are high in saturated fats, such as cheese and butter. Saturated fats may raise your risk of cancer. Limit processed foods, such as packaged meals and snacks. The items listed above may not be a complete list of foods and beverages you should avoid. Contact a dietitian for more information. Summary Good nutrition plays a big part in healing and keeping your body strong during and after cancer treatment. Ask to meet with a registered dietitian. A dietitian can help you create an eating plan that works well for you. During treatment, it may be helpful to eat small meals and snacks that are high in  calories and protein. After cancer treatment, eat plenty of proteins, carbohydrates, and fats. You may also want to eat a diet rich in plant-based foods because those have been shown to be beneficial for cancer survivors. This information is not intended to replace advice given to you by your health care provider. Make sure you discuss any questions you have with your health care provider. Document Revised: 08/14/2019 Document Reviewed: 08/14/2019 Elsevier Patient Education  [place]. Prediabetes Prediabetes is when your blood sugar (blood glucose) level is higher than normal but not high enough for you to be diagnosed with type 2 diabetes. Having prediabetes puts you at risk for developing type 2 diabetes (type 2 diabetes mellitus). With certain lifestyle changes, you may be able to prevent or delay the onset of type 2 diabetes. This is important because type 2 diabetes can lead to serious complications, such as: Heart disease. Stroke. Blindness. Kidney disease. Depression. Poor circulation in the feet and legs. In severe cases, this could lead to surgical removal of a leg (amputation). What are the causes? The exact cause of prediabetes is not known. It may result from insulin resistance. Insulin resistance develops when cells in the body do not respond properly to insulin that the body makes. This can cause excess glucose to build up in the blood. High blood glucose (hyperglycemia) can develop. What increases the risk? The following factors may make you more likely to develop this condition: You have a family member with type 2 diabetes. You are older than 45 years. You had a temporary form of diabetes during a pregnancy (gestational diabetes). You had polycystic ovary syndrome (PCOS). You are overweight or obese. You are inactive (sedentary). You have a history of heart disease, including problems with cholesterol levels, high levels of blood fats, or high blood pressure. What  are the signs or symptoms? You may have no symptoms. If you do have symptoms, they may include: Increased hunger. Increased thirst. Increased urination. Vision changes, such as blurry vision. Tiredness (fatigue). How is this diagnosed? This condition can be diagnosed with blood tests. Your blood glucose may be checked with one or more of the following tests: A fasting blood glucose (FBG) test. You will not be allowed to eat (you will fast) for at least 8 hours before a blood sample is taken. An A1C blood test (hemoglobin A1C). This test provides information about blood glucose levels over the previous 2?3 months. An oral glucose tolerance test (OGTT). This test measures your blood glucose at two points in time: After fasting. This is your baseline level. Two hours after you drink a beverage that contains glucose. You may be diagnosed with prediabetes if: Your FBG is 100?125 mg/dL (5.6-6.9 mmol/L). Your A1C level is 5.7?6.4% (39-46 mmol/mol). Your OGTT result is 140?199 mg/dL (7.8-11 mmol/L). These blood tests may be repeated to confirm your diagnosis.  How is this treated? Treatment may include dietary and lifestyle changes to help lower your blood glucose and prevent type 2 diabetes from developing. In some cases, medicine may be prescribed to help lower the risk of type 2 diabetes. Follow these instructions at home: Nutrition  Follow a healthy meal plan. This includes eating lean proteins, whole grains, legumes, fresh fruits and vegetables, low-fat dairy products, and healthy fats. Follow instructions from your health care provider about eating or drinking restrictions. Meet with a dietitian to create a healthy eating plan that is right for you. Lifestyle Do moderate-intensity exercise for at least 30 minutes a day on 5 or more days each week, or as told by your health care provider. A mix of activities may be best, such as: Brisk walking, swimming, biking, and weight lifting. Lose  weight as told by your health care provider. Losing 5-7% of your body weight can reverse insulin resistance. Do not drink alcohol if: Your health care provider tells you not to drink. You are pregnant, may be pregnant, or are planning to become pregnant. If you drink alcohol: Limit how much you use to: 0-1 drink a day for women. 0-2 drinks a day for men. Be aware of how much alcohol is in your drink. In the [place] one drink equals one 12 oz bottle of beer (355 mL), one 5 oz glass of wine (148 mL), or one 1 oz glass of hard liquor (44 mL). General instructions Take over-the-counter and prescription medicines only as told by your health care provider. You may be prescribed medicines that help lower the risk of type 2 diabetes. Do not use any products that contain nicotine or tobacco, such as cigarettes, e-cigarettes, and chewing tobacco. If you need help quitting, ask your health care provider. Keep all follow-up visits. This is important. Where to find more information American Diabetes Association: www.diabetes.org Academy of Nutrition and Dietetics: www.eatright.org American Heart Association: www.heart.org Contact a health care provider if: You have any of these symptoms: Increased hunger. Increased urination. Increased thirst. Fatigue. Vision changes, such as blurry vision. Get help right away if you: Have shortness of breath. Feel confused. Vomit or feel like you may vomit. Summary Prediabetes is when your blood sugar (blood glucose)level is higher than normal but not high enough for you to be diagnosed with type 2 diabetes. Having prediabetes puts you at risk for developing type 2 diabetes (type 2 diabetes mellitus). Make lifestyle changes such as eating a healthy diet and exercising regularly to help prevent diabetes. Lose weight as told by your health care provider. This information is not intended to replace advice given to you by your health care provider. Make sure you  discuss any questions you have with your health care provider. Document Revised: 12/27/2019 Document Reviewed: 12/27/2019 Elsevier Patient Education  [place].

## 2022-06-29 NOTE — Progress Notes (Signed)
Subjective:    Patient ID: Juan Lewis, male    DOB: Sep 01, 1971, 51 y.o.   MRN: [id number]  51y/o african [place] male established patient here for evaluation s/p covid infection positive test 06/18/22  Patient completed course of paxlovid and stated that it helped the most during covid infection.  Patient reported he did not pick up prednisone from [facility] office at pharmacy.   Had some lingering fatigue and tried to follow up with PCM but due to outstanding balance on his bill was not seen 9/15 by PCM.  Patient concerned has had decreased appetite and weight loss over the past month recently improved appetite since covid infection.  Denied changes in diet/new exercise program.  PMHx cancer  Works at [employer] in [employer] job very active/exertional.  Patient returned to work yesterday 06/28/22 Day 10 strict mask wear covid infection.  Patient stated today feeling much better and son has recovered from covid also.      Review of Systems  Constitutional:  Positive for appetite change and fatigue. Negative for activity change, chills, diaphoresis and fever.  HENT:  Negative for congestion and trouble swallowing.   Eyes:  Negative for photophobia and visual disturbance.  Respiratory:  Negative for cough, shortness of breath, wheezing and stridor.   Gastrointestinal:  Negative for diarrhea, nausea and vomiting.  Musculoskeletal:  Negative for gait problem and myalgias.  Skin:  Negative for rash.  Allergic/Immunologic: Negative for food allergies.  Neurological:  Negative for dizziness, tremors, syncope, speech difficulty, weakness, light-headedness and headaches.  Psychiatric/Behavioral:  Negative for agitation, confusion and sleep disturbance.        Objective:   Physical Exam Vitals and nursing note reviewed.  Constitutional:      General: He is awake. He is not in acute distress.    Appearance: Normal appearance. He is well-developed, well-groomed and normal weight. He is not  ill-appearing, toxic-appearing or diaphoretic.  HENT:     Head: Normocephalic and atraumatic.     Jaw: There is normal jaw occlusion.     Salivary Glands: Right salivary gland is not diffusely enlarged. Left salivary gland is not diffusely enlarged.     Right Ear: Hearing and external ear normal.     Left Ear: Hearing and external ear normal.     Nose: Nose normal. No congestion or rhinorrhea.     Mouth/Throat:     Lips: Pink. No lesions.     Mouth: Mucous membranes are moist. No oral lesions or angioedema.     Dentition: No gum lesions.     Tongue: No lesions. Tongue does not deviate from midline.     Palate: No mass and lesions.     Pharynx: Oropharynx is clear. Uvula midline.  Eyes:     General: Lids are normal. Vision grossly intact. Gaze aligned appropriately. No allergic shiner or scleral icterus.       Right eye: No discharge.        Left eye: No discharge.     Extraocular Movements: Extraocular movements intact.     Conjunctiva/sclera: Conjunctivae normal.     Pupils: Pupils are equal, round, and reactive to light.  Neck:     Trachea: Trachea and phonation normal. No tracheal deviation.  Cardiovascular:     Rate and Rhythm: Normal rate and regular rhythm.     Pulses: Normal pulses.          Radial pulses are 2+ on the right side and 2+ on the left side.  Heart sounds: Normal heart sounds, S1 normal and S2 normal. Heart sounds not distant. No murmur heard.    No friction rub. No gallop.  Pulmonary:     Effort: Pulmonary effort is normal.     Breath sounds: Normal breath sounds and air entry. No stridor or transmitted upper airway sounds. No wheezing.     Comments: Spoke full sentences without difficulty; no cough observed BBS CTA Abdominal:     General: Abdomen is flat.     Palpations: Abdomen is soft.  Musculoskeletal:        General: Normal range of motion.     Cervical back: Normal range of motion and neck supple. No edema, erythema, signs of trauma, rigidity,  torticollis, tenderness or crepitus. No pain with movement. Normal range of motion.     Right lower leg: No edema.     Left lower leg: No edema.  Lymphadenopathy:     Head:     Right side of head: No submandibular or preauricular adenopathy.     Left side of head: No submandibular or preauricular adenopathy.     Cervical: No cervical adenopathy.     Right cervical: No superficial cervical adenopathy.    Left cervical: No superficial cervical adenopathy.  Skin:    General: Skin is warm and dry.     Capillary Refill: Capillary refill takes less than 2 seconds.     Coloration: Skin is not ashen, cyanotic, jaundiced, mottled, pale or sallow.     Findings: No abrasion, bruising, burn, erythema, signs of injury, laceration, lesion, petechiae, rash or wound.  Neurological:     General: No focal deficit present.     Mental Status: He is alert and oriented to person, place, and time. Mental status is at baseline.     Motor: Motor function is intact. No weakness, tremor, abnormal muscle tone or seizure activity.     Coordination: Coordination is intact. Coordination normal.     Gait: Gait is intact. Gait normal.     Comments: In/out of chair without difficulty; gait sure and steady in clinic; bilateral hand grasp equal 5/5  Psychiatric:        Attention and Perception: Attention and perception normal.        Mood and Affect: Mood and affect normal.        Speech: Speech normal.        Behavior: Behavior normal. Behavior is cooperative.        Thought Content: Thought content normal.        Cognition and Memory: Cognition and memory normal.        Judgment: Judgment normal.           Assessment & Plan:   A-unintentional weight loss, prediabetes  P-male exec panel, Hgba1c and D to be drawn and follow up in 2 weeks last checked May 2023.  Chart review patient to follow up with [facility] October 2023.  Reminded patient to schedule his cancer follow up evaluation with  specialist.  Will check to see if worsening prediabetes or new findings on male exec panel to explain weight loss cannot rule out recurrent cancer.  Patient unable to follow up with PCM at [facility] due to outstanding bill needs to make some payments before they will see him again.  Feeling well today but concerned about weight loss. [employer]care handouts on prediabetes and cancer survivorship.   Patient agreed with plan of care and had no further questions at this time.

## 2022-07-02 ENCOUNTER — Ambulatory Visit: Payer: BC Managed Care – PPO

## 2022-07-02 NOTE — Telephone Encounter (Signed)
Patient seen in workcenter feeling well.  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  no audible congestion nasal/throat clearing/cough/dyspnea with exertion or speech.  Patient reported feeling well stated paxlovid helped him the most during covid infection.  Stated he did not see PCM as outstanding balance for previous office visits and he could not pay on outstanding balance so office cancelled appt.  He did not know about prednisone prescription and did not pick it up.  Patient stated son feeling well also.  Patient has had unintentional weight loss over the past month and would like appt.  Appt scheduled for patient today in clinic.  See office note.

## 2022-07-15 ENCOUNTER — Ambulatory Visit: Payer: BC Managed Care – PPO | Admitting: Registered Nurse

## 2022-07-15 ENCOUNTER — Encounter: Payer: Self-pay | Admitting: Registered Nurse

## 2022-07-15 VITALS — BP 108/68 | HR 67 | Temp 97.4°F | Resp 16 | Wt 153.0 lb

## 2022-07-15 DIAGNOSIS — R7303 Prediabetes: Secondary | ICD-10-CM

## 2022-07-15 DIAGNOSIS — R634 Abnormal weight loss: Secondary | ICD-10-CM

## 2022-07-15 DIAGNOSIS — D649 Anemia, unspecified: Secondary | ICD-10-CM

## 2022-07-15 LAB — GLUCOSE, POCT (MANUAL RESULT ENTRY): POC Glucose: 121 mg/dl — AB (ref 70–99)

## 2022-07-15 NOTE — Progress Notes (Signed)
Subjective:    Patient ID: Juan Lewis, male    DOB: 07/11/1971, 51 y.o.   MRN: [id number]  51y/o african [place] male here for labs nonfasting and weight recheck.  Appetite at baseline again usual 1-2 meals per day.  Eats varied diet includes meat.    Denied abdomen pain/dysphagia/dysphasia/changes in stool consistency/frequency/color/enlarged lymph nodes/cough/n/v/d/fatigue/chills/headaches/dyspnea with exertion/shortness of breath.  Feeling well good energy level.      Review of Systems  Constitutional:  Negative for activity change, appetite change, chills, diaphoresis, fatigue and fever.  HENT:  Negative for congestion, ear pain and sore throat.   Eyes:  Negative for pain and discharge.  Respiratory:  Negative for cough, chest tightness, shortness of breath, wheezing and stridor.   Cardiovascular:  Negative for chest pain and leg swelling.  Gastrointestinal:  Negative for abdominal pain, blood in stool, constipation, diarrhea, nausea and vomiting.  Endocrine: Negative for polydipsia, polyphagia and polyuria.  Genitourinary:  Negative for difficulty urinating, dysuria and hematuria.  Musculoskeletal:  Negative for arthralgias, back pain and myalgias.  Skin:  Negative for rash.  Allergic/Immunologic: Negative for environmental allergies and food allergies.  Neurological:  Negative for headaches.  Hematological:  Negative for adenopathy. Does not bruise/bleed easily.  Psychiatric/Behavioral:  Negative for agitation, confusion and sleep disturbance. The patient is not nervous/anxious.        Objective:   Physical Exam Vitals and nursing note reviewed.  Constitutional:      General: He is awake. He is not in acute distress.    Appearance: Normal appearance. He is well-developed, well-groomed and normal weight. He is not ill-appearing, toxic-appearing or diaphoretic.  HENT:     Head: Normocephalic and atraumatic.     Jaw: There is normal jaw occlusion.     Salivary Glands:  Right salivary gland is not diffusely enlarged or tender. Left salivary gland is not diffusely enlarged or tender.     Right Ear: Hearing and external ear normal.     Left Ear: Hearing and external ear normal.     Nose: Nose normal. No congestion or rhinorrhea.     Mouth/Throat:     Lips: Pink. No lesions.     Mouth: Mucous membranes are moist. No injury, lacerations, oral lesions or angioedema.     Dentition: No gum lesions.     Tongue: No lesions. Tongue does not deviate from midline.     Palate: No mass and lesions.     Pharynx: Oropharynx is clear. Uvula midline. No pharyngeal swelling, oropharyngeal exudate, posterior oropharyngeal erythema or uvula swelling.     Tonsils: No tonsillar exudate.  Eyes:     General: Lids are normal. Vision grossly intact. Gaze aligned appropriately. No allergic shiner or scleral icterus.       Right eye: No discharge.        Left eye: No discharge.     Extraocular Movements: Extraocular movements intact.     Conjunctiva/sclera: Conjunctivae normal.     Pupils: Pupils are equal, round, and reactive to light.  Neck:     Trachea: Trachea and phonation normal. No tracheal deviation.  Cardiovascular:     Rate and Rhythm: Normal rate and regular rhythm.     Pulses: Normal pulses.          Radial pulses are 2+ on the right side and 2+ on the left side.     Heart sounds: Normal heart sounds, S1 normal and S2 normal. No murmur heard.    No friction rub. No  gallop.  Pulmonary:     Effort: Pulmonary effort is normal. No respiratory distress.     Breath sounds: Normal breath sounds and air entry. No stridor, decreased air movement or transmitted upper airway sounds. No decreased breath sounds, wheezing, rhonchi or rales.     Comments: Spoke full sentences without difficulty; no cough observed in exam room Abdominal:     General: Abdomen is flat. Bowel sounds are normal. There is no distension.     Palpations: Abdomen is soft. There is no mass.     Tenderness:  There is no abdominal tenderness. There is no right CVA tenderness, left CVA tenderness, guarding or rebound. Negative signs include Murphy's sign.     Hernia: No hernia is present. There is no hernia in the umbilical area or ventral area.     Comments: Dull to percussion x 4 quads; normoactive bowel sounds x 4 quads  Musculoskeletal:        General: No swelling, tenderness, deformity or signs of injury. Normal range of motion.     Right hand: No swelling. Normal strength. Normal capillary refill.     Left hand: No swelling. Normal strength. Normal capillary refill.     Cervical back: Normal range of motion and neck supple. No swelling, edema, deformity, erythema, signs of trauma, lacerations, rigidity, spasms, torticollis, tenderness or crepitus. No pain with movement or muscular tenderness. Normal range of motion.     Thoracic back: No swelling, edema, deformity, signs of trauma, lacerations, spasms or tenderness. Normal range of motion.     Right lower leg: No edema.     Left lower leg: No edema.     Right ankle: No swelling, deformity or ecchymosis.     Left ankle: No swelling, deformity or ecchymosis.  Lymphadenopathy:     Head:     Right side of head: No submandibular or preauricular adenopathy.     Left side of head: No submandibular or preauricular adenopathy.     Cervical: No cervical adenopathy.     Right cervical: No superficial cervical adenopathy.    Left cervical: No superficial cervical adenopathy.  Skin:    General: Skin is warm and dry.     Capillary Refill: Capillary refill takes less than 2 seconds.     Coloration: Skin is not ashen, cyanotic, jaundiced, mottled, pale or sallow.     Findings: No abrasion, abscess, acne, bruising, burn, ecchymosis, erythema, signs of injury, laceration, lesion, petechiae, rash or wound.     Nails: There is no clubbing.  Neurological:     General: No focal deficit present.     Mental Status: He is alert and oriented to person, place, and  time. Mental status is at baseline.     GCS: GCS eye subscore is 4. GCS verbal subscore is 5. GCS motor subscore is 6.     Cranial Nerves: Cranial nerves 2-12 are intact. No cranial nerve deficit, dysarthria or facial asymmetry.     Sensory: Sensation is intact.     Motor: Motor function is intact. No weakness, tremor, atrophy, abnormal muscle tone or seizure activity.     Coordination: Coordination is intact. Coordination normal.     Gait: Gait is intact. Gait normal.     Comments: In/out of chair and on/off exam table without difficulty; gait sure and steady in clinic; bilateral hand grasp equal 5/5  Psychiatric:        Attention and Perception: Attention and perception normal.  Mood and Affect: Mood and affect normal.        Speech: Speech normal.        Behavior: Behavior normal. Behavior is cooperative.        Thought Content: Thought content normal.        Cognition and Memory: Cognition and memory normal.        Judgment: Judgment normal.           Assessment & Plan:  A-unintentional weight loss, prediabetes  P-recheck male exec panel nonfasting and vitamin D today.  POCT glucose normal ate lunch approximately 90 minutes prior to appt.  Weight improved from September visit 1 lb.  Schedule follow up with otolaryngology 6 month appt due this month patient given provider phone number last seen in April 2023.  See RN Vinnie Level tomorrow for lab results in clinic. Patient has strenuous job in warehouse encouraged him to eat 2-3 meals per day protein/vegetables each meal.   Patient verbalized understanding information/instructions, agreed with plan of care and had no further questions at this time.

## 2022-07-16 ENCOUNTER — Encounter: Payer: Self-pay | Admitting: Registered Nurse

## 2022-07-16 LAB — CMP12+LP+TP+TSH+6AC+PSA+CBC…
ALT: 14 IU/L (ref 0–44)
AST: 16 IU/L (ref 0–40)
Albumin/Globulin Ratio: 1.8 (ref 1.2–2.2)
Albumin: 4.4 g/dL (ref 3.8–4.9)
Alkaline Phosphatase: 41 IU/L — ABNORMAL LOW (ref 44–121)
BUN/Creatinine Ratio: 18 (ref 9–20)
BUN: 15 mg/dL (ref 6–24)
Basophils Absolute: 0 10*3/uL (ref 0.0–0.2)
Basos: 1 %
Bilirubin Total: 0.3 mg/dL (ref 0.0–1.2)
Calcium: 9.7 mg/dL (ref 8.7–10.2)
Chloride: 106 mmol/L (ref 96–106)
Chol/HDL Ratio: 3.1 ratio (ref 0.0–5.0)
Cholesterol, Total: 147 mg/dL (ref 100–199)
Creatinine, Ser: 0.84 mg/dL (ref 0.76–1.27)
EOS (ABSOLUTE): 0.1 10*3/uL (ref 0.0–0.4)
Eos: 1 %
Estimated CHD Risk: <0.5 times avg. (ref 0.0–1.0)
Free Thyroxine Index: 1.7 (ref 1.2–4.9)
GGT: 24 IU/L (ref 0–65)
Globulin, Total: 2.4 g/dL (ref 1.5–4.5)
Glucose: 89 mg/dL (ref 70–99)
HDL: 48 mg/dL (ref >39)
Hematocrit: 38.4 % (ref 37.5–51.0)
Hemoglobin: 12.2 g/dL — ABNORMAL LOW (ref 13.0–17.7)
Immature Grans (Abs): 0 10*3/uL (ref 0.0–0.1)
Immature Granulocytes: 0 %
Iron: 96 ug/dL (ref 38–169)
LDH: 157 IU/L (ref 121–224)
LDL Chol Calc (NIH): 80 mg/dL (ref 0–99)
Lymphocytes Absolute: 1.3 10*3/uL (ref 0.7–3.1)
Lymphs: 37 %
MCH: 28.8 pg (ref 26.6–33.0)
MCHC: 31.8 g/dL (ref 31.5–35.7)
MCV: 91 fL (ref 79–97)
Monocytes Absolute: 0.4 10*3/uL (ref 0.1–0.9)
Monocytes: 10 %
Neutrophils Absolute: 1.9 10*3/uL (ref 1.4–7.0)
Neutrophils: 51 %
Phosphorus: 3 mg/dL (ref 2.8–4.1)
Platelets: 237 10*3/uL (ref 150–450)
Potassium: 4.4 mmol/L (ref 3.5–5.2)
Prostate Specific Ag, Serum: 0.6 ng/mL (ref 0.0–4.0)
RBC: 4.23 x10E6/uL (ref 4.14–5.80)
RDW: 13.7 % (ref 11.6–15.4)
Sodium: 142 mmol/L (ref 134–144)
T3 Uptake Ratio: 28 % (ref 24–39)
T4, Total: 5.9 ug/dL (ref 4.5–12.0)
TSH: 1.58 u[IU]/mL (ref 0.450–4.500)
Total Protein: 6.8 g/dL (ref 6.0–8.5)
Triglycerides: 103 mg/dL (ref 0–149)
Uric Acid: 6 mg/dL (ref 3.8–8.4)
VLDL Cholesterol Cal: 19 mg/dL (ref 5–40)
WBC: 3.6 10*3/uL (ref 3.4–10.8)
eGFR: 104 mL/min/{1.73_m2} (ref >59)

## 2022-07-16 LAB — HEMOGLOBIN A1C
Est. average glucose Bld gHb Est-mCnc: 120 mg/dL
Hgb A1c MFr Bld: 5.8 % — ABNORMAL HIGH (ref 4.8–5.6)

## 2022-07-16 LAB — VITAMIN D 25 HYDROXY (VIT D DEFICIENCY, FRACTURES): Vit D, 25-Hydroxy: 31.3 ng/mL (ref 30.0–100.0)

## 2022-07-16 NOTE — Addendum Note (Signed)
Addended by: Gerarda Fraction A on: 07/16/2022 03:05 PM   Modules accepted: Orders

## 2022-08-05 ENCOUNTER — Telehealth: Payer: Self-pay | Admitting: *Deleted

## 2022-08-05 NOTE — Telephone Encounter (Signed)
CALLED PATIENT TO REMIND OF LAB AND FU ON 08-06-22, SPOKE WITH PATIENT AND HE IS AWARE OF THESE APPTS.

## 2022-08-06 ENCOUNTER — Ambulatory Visit: Payer: BC Managed Care – PPO

## 2022-08-06 ENCOUNTER — Ambulatory Visit: Payer: BC Managed Care – PPO | Admitting: Radiation Oncology

## 2022-08-06 ENCOUNTER — Telehealth: Payer: Self-pay | Admitting: *Deleted

## 2022-08-06 DIAGNOSIS — C320 Malignant neoplasm of glottis: Secondary | ICD-10-CM

## 2022-08-06 NOTE — Telephone Encounter (Signed)
CALLED PATIENT TO ASK ABOUT DELAYING FU APPT. UNTIL JAN. 2024 PER DR. SQUIRE, SPOKE WITH PATIENT AND HE AGREED TO COME ON 11-09-2022

## 2022-08-11 NOTE — Telephone Encounter (Signed)
Patient seen in workcenter reported he saw ENT/cancer follow up appt a couple weeks ago and no cancer found.  Feeling well and denied concerns at this time.  Epic reviewed appt 07/29/22 TSH rechecked normal and office visit exam normal.  Next follow up with ENT recommended 6 months.

## 2022-09-11 ENCOUNTER — Telehealth: Payer: Self-pay | Admitting: Registered Nurse

## 2022-09-11 ENCOUNTER — Encounter: Payer: Self-pay | Admitting: Registered Nurse

## 2022-09-11 NOTE — Telephone Encounter (Signed)
HR Tonya notified me patient has been out of work since 3 Nov ?hospitalized.  Per Epic review saw PCM and given work note 08/26/22 and patient to return for [facility] paperwork as oncology follow up in Jan 2024, referred to ENT and GI for CSP also.  Labs ordered but no results available in Care Everywhere.  Last labs viewable those done Oct [employer].  Slightly low Hgb 12.2 dropped from 13.2 in May 2023 and slightly worse Hgba1c 5.8 otherwise essentially normal labs.  History of tobacco and alcohol use.  HR Tonya notified PCM to be completing FMLA paperwork for patient on chart review.

## 2022-09-13 NOTE — Telephone Encounter (Signed)
HR Tonya notified me [employer] has not received FMLA paperwork nor has she.  Telephone message left for patient.

## 2022-09-17 NOTE — Telephone Encounter (Signed)
See tcon dated 09/11/22

## 2022-10-20 NOTE — Telephone Encounter (Signed)
Notified by HR patient terminated employment 17 Sep 2022 no longer eligible for care in clinic

## 2022-10-28 NOTE — Progress Notes (Signed)
Juan Lewis presents for follow up of radiation completed 06/01/2019 to his head and neck.  Pain issues, if any: no pain Using a feeding tube?: no feeding tube Weight changes, if any: good weight  Wt Readings from Last 3 Encounters:  11/09/22 159 lb 12.8 oz (72.5 kg)  07/15/22 153 lb (69.4 kg)  06/29/22 152 lb (68.9 kg)    Swallowing issues, if any: no problems Smoking or chewing tobacco? none Using fluoride trays daily? none Last ENT visit was on:  Impression & Plans:  Juan Lewis is a 52 y.o. male with history of T2N0M0 right anterior vocal cord cancer, status post completion of radiation therapy in 2020 with subsequent laser cordectomy of the right true vocal fold and anterior commissure performed by Dr. 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 Flexible nasolaryngoscopy was performed today, which demonstrated postsurgical changes as above with no evidence of findings concerning for recurrent disease. Patient was reassured. He does note a unintentional weight loss of approximately 10 pounds over the last 6 months. He denies symptoms of dysphagia, odynophagia, nausea, emesis, diarrhea, night sweats, fatigue. He was recently seen by his PCP and had some baseline labs performed. Patient states he is currently working 2 jobs and averages about 5 hours of sleep per night. I suspect he would likely benefit from additional calories, and encouraged supplementing his diet with a protein shake. We will obtain an updated TSH to ensure no thyroid irregularities; last TSH 6 months ago was normal. Patient will follow-up in 6 months for routine head neck cancer surveillance, or sooner if needed.  [place], DO Otolaryngology   Electronically signed by: Maryan Rued, DO 07/29/22 1122   Other notable issues, if any: no questions for Dr. Isidore Moos today  Vitals:   11/09/22 1456  BP: 115/74  Pulse: 77  Resp: 18  Temp: 97.6 F (36.4 C)  SpO2: 99%

## 2022-11-09 ENCOUNTER — Ambulatory Visit
Admission: RE | Admit: 2022-11-09 | Discharge: 2022-11-09 | Disposition: A | Discharge status: Home / Self Care | Payer: BC Managed Care – PPO | Source: Ambulatory Visit | Attending: Radiation Oncology | Admitting: Radiation Oncology

## 2022-11-09 ENCOUNTER — Encounter: Payer: Self-pay | Admitting: Radiation Oncology

## 2022-11-09 ENCOUNTER — Telehealth: Payer: Self-pay | Admitting: *Deleted

## 2022-11-09 ENCOUNTER — Other Ambulatory Visit: Payer: Self-pay

## 2022-11-09 VITALS — BP 115/74 | HR 77 | Temp 97.6°F | Resp 18 | Ht 68.0 in | Wt 159.8 lb

## 2022-11-09 DIAGNOSIS — C61 Malignant neoplasm of prostate: Secondary | ICD-10-CM | POA: Diagnosis not present

## 2022-11-09 DIAGNOSIS — Z923 Personal history of irradiation: Secondary | ICD-10-CM | POA: Insufficient documentation

## 2022-11-09 DIAGNOSIS — C320 Malignant neoplasm of glottis: Secondary | ICD-10-CM

## 2022-11-09 DIAGNOSIS — Z8521 Personal history of malignant neoplasm of larynx: Secondary | ICD-10-CM | POA: Insufficient documentation

## 2022-11-09 LAB — TSH: TSH: 2.598 u[IU]/mL (ref 0.350–4.500)

## 2022-11-09 MED ORDER — OXYMETAZOLINE HCL 0.05 % NA SOLN
1.0000 | Freq: Two times a day (BID) | NASAL | Status: DC
  Administered 2022-11-09: 1 via NASAL
  Filled 2022-11-09: qty 30

## 2022-11-09 NOTE — Telephone Encounter (Signed)
CALLED PATIENT TO REMIND OF LAB AND FU FOR 11-09-22, LVM FOR A RETURN CALL

## 2022-11-09 NOTE — Progress Notes (Signed)
Radiation Oncology         [phone] ________________________________  Name: Juan Lewis MRN: [id number]  Date: 11/09/2022  DOB: 12-08-1970  Follow-Up Visit Note, in person, outpatient   CC: Pcp, No  Melony Overly E, *  Diagnosis and Prior Radiotherapy:       ICD-10-CM   1. 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 Melony Overly Microlaryngoscopy with CO2 laser excision of right anterior true vocal cord, anterior commissure and superficial left anterior true vocal cord. --FINAL MICROSCOPIC DIAGNOSIS:  A. VOCAL CORD, LEFT ANTERIOR, BIOPSY:  - Squamous mucosa with atypia concerning for low grade dysplasia - Negative for carcinoma  B. VOCAL CORD, RIGHT ANTERIOR BIOPSY, AND ANTERIOR COMMISSURE:  - Minute focus of invasive moderately differentiated squamous cell carcinoma     CHIEF COMPLAINT:  Here for follow-up and surveillance of glottic cancer  Narrative:    Juan Lewis presents for follow up of radiation completed 06/01/2019 to his head and neck  Pain issues, if any: no pain Using a feeding tube?: no feeding tube Weight changes, if any: good weight     Wt Readings from Last 3 Encounters:  11/09/22 159 lb 12.8 oz (72.5 kg)  07/15/22 153 lb (69.4 kg)  06/29/22 152 lb (68.9 kg)      Swallowing issues, if any: no problems Smoking or chewing tobacco? none Using fluoride trays daily? none Last ENT visit was on:  Impression & Plans:  Juan Lewis is a 52 y.o. male with history of T2N0M0 right anterior vocal cord cancer, status post completion of radiation therapy in 2020 with subsequent laser cordectomy of the right true vocal fold and anterior commissure performed by Dr. 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 Flexible nasolaryngoscopy was performed today, which demonstrated postsurgical changes as above with no evidence of findings concerning for recurrent disease. Patient was reassured. He does note a unintentional weight loss of approximately 10 pounds over the last 6 months. He denies symptoms of dysphagia, odynophagia, nausea, emesis, diarrhea, night sweats, fatigue. He was recently seen by his PCP and had some baseline labs performed. Patient states he is currently working 2 jobs and averages about 5 hours of sleep per night. I suspect he would likely benefit from additional calories, and encouraged supplementing his diet with a protein shake. We will obtain an updated TSH to ensure no thyroid irregularities; last TSH 6 months ago was normal. Patient will follow-up in 6 months for routine head neck cancer surveillance, or sooner if needed.  Meghan Donia Pounds, DO Otolaryngology   Other notable issues, if any: no questions for Dr. Isidore Moos today   ALLERGIES:  has No Known Allergies.  Meds: Current Outpatient Medications  Medication Sig Dispense Refill   artificial tears (LACRILUBE) OINT ophthalmic ointment Place into the left eye at bedtime as needed for dry eyes. Please place the ophthalmic ointment in your eye and then tape your eyelid closed at night. (Patient not taking: Reported on 01/14/2022) 7 g 0   loratadine (CLARITIN) 10 MG tablet Take 1 tablet (10 mg total) by mouth daily as needed for allergies or rhinitis. 30 tablet 0   predniSONE (DELTASONE) 10 MG tablet Take 60 mg by mouth  daily. (Patient not taking: Reported on 11/09/2022)     sildenafil (VIAGRA) 100 MG tablet Take 100 mg by mouth daily as needed. (Patient not taking: Reported on 06/18/2022)     silodosin (RAPAFLO) 4 MG CAPS capsule Take 4 mg by mouth at bedtime. (Patient not taking: Reported on 01/14/2022)     tamsulosin (FLOMAX) 0.4 MG CAPS capsule Take 1 capsule by mouth at  bedtime. (Patient not taking: Reported on 06/18/2022)     No current facility-administered medications for this encounter.    Physical Findings: The patient is in no acute distress. Patient is alert and oriented. Wt Readings from Last 3 Encounters:  11/09/22 159 lb 12.8 oz (72.5 kg)  07/15/22 153 lb (69.4 kg)  06/29/22 152 lb (68.9 kg)    height is '5\' 8"'$  (1.727 m) and weight is 159 lb 12.8 oz (72.5 kg). His temperature is 97.6 F (36.4 C). His blood pressure is 115/74 and his pulse is 77. His respiration is 18 and oxygen saturation is 99%. .  General: Alert and oriented, in no acute distress HEENT: Head is normocephalic.  Oral cavity and oropharynx  notable for no lesions Neck: No palpable adenopathy Chest clear to auscultation bilaterally Heart regular in rate and rhythm Abdomen soft and nontender Skin: Satisfactory healing of skin of neck skin with residual hyperpigmentation Psychiatric: Judgment and insight are intact. Affect is appropriate.  PROCEDURE NOTE:  After obtaining consent and anesthetizing the nasal cavity with topical oxymetolazine, the flexible endoscope was introduced and passed through the nasal cavity.  The nasopharynx, oropharynx, larynx, and hypopharynx were then examined. No lesions appreciated in nasopharynx, oropharynx, hypopharynx, or larynx. True cords are without lesions or paralysis.   Lab Findings: Lab Results  Component Value Date   WBC 3.6 07/15/2022   HGB 12.2 (L) 07/15/2022   HCT 38.4 07/15/2022   MCV 91 07/15/2022   PLT 237 07/15/2022    Lab Results  Component Value Date   TSH 2.598 11/09/2022    Radiographic Findings: No results found.  Impression/Plan:    1) Head and Neck Cancer Status: NED  2) Nutritional Status: No issues  3) Risk Factors: The patient has been educated about risk factors including alcohol and tobacco abuse; they understand that avoidance of alcohol and tobacco is important to prevent recurrences as well as other  cancers - not smoking any substances whatsoever.  4) Swallowing: No issues  5)  Thyroid function:  Lab Results  Component Value Date   TSH 2.598 11/09/2022  Within normal limits, continue to check annually.    6) F/u w/ Dr. Fredric Dine in 4 months. Will see me back in 8 mo for routine follow up.     On date of service, in total, I spent 30 minutes on this encounter. Patient was seen in person.  Note was signed after date of encounter.  Minutes pertaining to date of encounter only.  _____________________________________   Leona Singleton, PA    Eppie Gibson, MD

## 2022-11-09 NOTE — Progress Notes (Signed)
Oncology Nurse Navigator Documentation   Per patient's 11/09/22 post-treatment follow-up with Dr. Isidore Moos, sent fax to [facility] ENT Scheduling with request Mr. Dupras be contacted and scheduled for routine post-RT follow-up with Dr. Fredric Dine in 4 months.  Notification of successful fax transmission received.   Harlow Asa RN, BSN, OCN Head & Neck Oncology Nurse [place] at [facility] Phone # [phone]  Fax # [phone]

## 2022-11-10 ENCOUNTER — Other Ambulatory Visit: Payer: Self-pay

## 2022-11-10 ENCOUNTER — Encounter: Payer: Self-pay | Admitting: Radiation Oncology

## 2022-11-10 DIAGNOSIS — Z1329 Encounter for screening for other suspected endocrine disorder: Secondary | ICD-10-CM

## 2022-12-22 NOTE — Progress Notes (Signed)
noted 

## 2023-06-01 ENCOUNTER — Other Ambulatory Visit (HOSPITAL_COMMUNITY): Payer: Self-pay | Admitting: Urology

## 2023-06-01 DIAGNOSIS — C61 Malignant neoplasm of prostate: Secondary | ICD-10-CM

## 2023-06-15 ENCOUNTER — Ambulatory Visit (HOSPITAL_COMMUNITY)
Admission: RE | Admit: 2023-06-15 | Discharge: 2023-06-15 | Disposition: A | Discharge status: Home / Self Care | Payer: BC Managed Care – PPO | Source: Ambulatory Visit | Attending: Urology | Admitting: Urology

## 2023-06-15 DIAGNOSIS — C61 Malignant neoplasm of prostate: Secondary | ICD-10-CM | POA: Insufficient documentation

## 2023-06-15 MED ORDER — FLOTUFOLASTAT F 18 GALLIUM 296-5846 MBQ/ML IV SOLN
8.7400 | Freq: Once | INTRAVENOUS | Status: AC
  Administered 2023-06-15: 8.74 via INTRAVENOUS

## 2023-06-24 NOTE — Progress Notes (Signed)
Juan Lewis presents for follow up of radiation completed 06/01/2019 to his head and neck.   Pain issues, if any: no pain concerns Using a feeding tube?: none Weight changes, if any:  Wt Readings from Last 3 Encounters:  07/08/23 160 lb 6 oz (72.7 kg)  11/09/22 159 lb 12.8 oz (72.5 kg)  07/15/22 153 lb (69.4 kg)    Swallowing issues, if any: no swallowing concerns Smoking or chewing tobacco? none Using fluoride toothpaste daily? no Last ENT visit was on: Dr. Irene Limbo on 05-16-23 with clean scope Other notable issues, if any: no concerns or questions at this time  NM PET (PSMA) SKULL TO MID THIGH 06/15/2023  IMPRESSION: 1. No evidence of prostate cancer recurrence in the prostate gland. 2. No evidence of metastatic adenopathy in the pelvis or periaortic retroperitoneum. 3. No evidence of visceral metastasis or skeletal metastasis.  05-16-23 Dr. Irene Limbo Comments: Flexible Laryngoscopy Procedure Note  Indications: Cancer surveillance  Risks, benefits and clinical relevance of the study were discussed. The patient understands and agrees to proceed.   Procedure: After adequate topical anesthetic was applied, 4 mm flexible laryngoscope was passed through the nasal cavity without difficulty. Flexible laryngoscopy shows patent anterior nasal cavity with minimal crusting, no discharge or infection.  Normal base of tongue and supraglottis Normal vocal cord mobility without vocal cord nodule, mass, polyp or tumor. Stable small defect noted at the anterior portion of the right true vocal fold from previous laser cordectomy. No significant changes on laryngoscopy in comparison to photo documentation from previous visit on 07/29/2022. Hypopharynx normal without mass, pooling of secretions or aspiration.

## 2023-07-07 ENCOUNTER — Telehealth: Payer: Self-pay | Admitting: *Deleted

## 2023-07-07 NOTE — Telephone Encounter (Signed)
CALLED PATIENT TO REMIND OF LAB AND FU APPT. FOR 07-08-23, LVM FOR A RETURN CALL

## 2023-07-08 ENCOUNTER — Ambulatory Visit
Admission: RE | Admit: 2023-07-08 | Discharge: 2023-07-08 | Disposition: A | Discharge status: Home / Self Care | Payer: BC Managed Care – PPO | Source: Ambulatory Visit | Attending: Radiation Oncology | Admitting: Radiation Oncology

## 2023-07-08 ENCOUNTER — Encounter: Payer: Self-pay | Admitting: Radiation Oncology

## 2023-07-08 VITALS — BP 124/87 | HR 58 | Temp 96.4°F | Resp 18 | Ht 67.0 in | Wt 160.4 lb

## 2023-07-08 DIAGNOSIS — C61 Malignant neoplasm of prostate: Secondary | ICD-10-CM | POA: Insufficient documentation

## 2023-07-08 DIAGNOSIS — C320 Malignant neoplasm of glottis: Secondary | ICD-10-CM | POA: Insufficient documentation

## 2023-07-08 DIAGNOSIS — Z1329 Encounter for screening for other suspected endocrine disorder: Secondary | ICD-10-CM | POA: Diagnosis present

## 2023-07-08 LAB — T4, FREE: Free T4: 0.73 ng/dL (ref 0.61–1.12)

## 2023-07-08 LAB — TSH: TSH: 5.042 u[IU]/mL — ABNORMAL HIGH (ref 0.350–4.500)

## 2023-07-08 MED ORDER — OXYMETAZOLINE HCL 0.05 % NA SOLN
1.0000 | Freq: Two times a day (BID) | NASAL | Status: DC
  Administered 2023-07-08: 1 via NASAL
  Filled 2023-07-08: qty 30

## 2023-07-10 ENCOUNTER — Encounter: Payer: Self-pay | Admitting: Radiation Oncology

## 2023-07-10 NOTE — Progress Notes (Signed)
Radiation Oncology         [phone] ________________________________  Name: Juan Lewis MRN: [id number]  Date: 07/08/2023  DOB: 08-25-1971  Follow-Up Visit Note, in person, outpatient   CC: Pcp, No  Dillard Cannon E, *  Diagnosis and Prior Radiotherapy:       ICD-10-CM   1. 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 Dillard Cannon Microlaryngoscopy with CO2 laser excision of right anterior true vocal cord, anterior commissure and superficial left anterior true vocal cord. --FINAL MICROSCOPIC DIAGNOSIS:  A. VOCAL CORD, LEFT ANTERIOR, BIOPSY:  - Squamous mucosa with atypia concerning for low grade dysplasia - Negative for carcinoma  B. 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   Pain issues, if any: no pain concerns Using a feeding tube?: none Weight changes, if any:  Wt Readings from Last 3 Encounters:  07/08/23 160 lb 6 oz (72.7 kg)  11/09/22 159 lb 12.8 oz (72.5 kg)  07/15/22 153 lb (69.4 kg)    Swallowing issues, if any: no swallowing concerns Smoking or chewing tobacco? none Using fluoride toothpaste daily? no Last ENT visit was on: Dr. Irene Limbo on 05-16-23 with clean scope Other notable issues, if any: no concerns or questions at this time  NM PET (PSMA) SKULL TO MID THIGH 06/15/2023  IMPRESSION: 1. No evidence of prostate cancer recurrence in the prostate gland. 2. No evidence of  metastatic adenopathy in the pelvis or periaortic retroperitoneum. 3. No evidence of visceral metastasis or skeletal metastasis.  05-16-23 Dr. Irene Limbo Comments: Flexible Laryngoscopy Procedure Note  Indications: Cancer surveillance  Risks, benefits and clinical relevance of the study were discussed. The patient understands and agrees to proceed.   Procedure: After adequate topical anesthetic was applied, 4 mm flexible laryngoscope was passed through the nasal cavity without difficulty. Flexible laryngoscopy shows patent anterior nasal cavity with minimal crusting, no discharge or infection.  Normal base of tongue and supraglottis Normal vocal cord mobility without vocal cord nodule, mass, polyp or tumor. Stable small defect noted at the anterior portion of the right true vocal fold from previous laser cordectomy. No significant changes on laryngoscopy in comparison to photo documentation from previous visit on 07/29/2022. Hypopharynx normal without mass, pooling of secretions or aspiration.    Other notable issues, if any: no questions for Dr. Basilio Cairo today   ALLERGIES:  has No Known Allergies.  Meds: Current Outpatient Medications  Medication Sig Dispense Refill   artificial tears (LACRILUBE) OINT ophthalmic ointment Place into the left eye at bedtime as needed for dry eyes. Please place the ophthalmic ointment in your eye and then tape your eyelid closed at night. (Patient not taking: Reported on 01/14/2022) 7 g 0   loratadine (CLARITIN) 10 MG tablet Take 1 tablet (10 mg total) by mouth daily as needed for allergies or rhinitis. 30 tablet 0   predniSONE (DELTASONE) 10 MG tablet Take 60 mg by mouth daily. (Patient not taking: Reported  on 11/09/2022)     sildenafil (VIAGRA) 100 MG tablet Take 100 mg by mouth daily as needed. (Patient not taking: Reported on 06/18/2022)     silodosin (RAPAFLO) 4 MG CAPS capsule Take 4 mg by mouth at bedtime. (Patient not taking: Reported on 01/14/2022)      tamsulosin (FLOMAX) 0.4 MG CAPS capsule Take 1 capsule by mouth at bedtime. (Patient not taking: Reported on 06/18/2022)     No current facility-administered medications for this encounter.    Physical Findings: The patient is in no acute distress. Patient is alert and oriented. Wt Readings from Last 3 Encounters:  07/08/23 160 lb 6 oz (72.7 kg)  11/09/22 159 lb 12.8 oz (72.5 kg)  07/15/22 153 lb (69.4 kg)    height is 5\' 7"  (1.702 m) and weight is 160 lb 6 oz (72.7 kg). His temporal temperature is 96.4 F (35.8 C) (abnormal). His blood pressure is 124/87 and his pulse is 58 (abnormal). His respiration is 18 and oxygen saturation is 100%. .  General: Alert and oriented, in no acute distress HEENT: Head is normocephalic.  Oral cavity and oropharynx  notable for no lesions Neck: No palpable adenopathy Chest clear to auscultation bilaterally Heart regular in rate and rhythm Abdomen soft and nontender Skin: Satisfactory healing of skin of neck skin with residual hyperpigmentation Psychiatric: Judgment and insight are intact. Affect is appropriate.  PROCEDURE NOTE:  After obtaining consent and anesthetizing the nasal cavity with topical oxymetolazine, the flexible endoscope was coated with lidocaine gel and introduced and passed through the nasal cavity.  The nasopharynx, oropharynx, larynx, and hypopharynx were then examined. No lesions appreciated in nasopharynx, oropharynx, hypopharynx, or larynx. True cords are without lesions or paralysis. He tolerated this well   Lab Findings: Lab Results  Component Value Date   WBC 3.6 07/15/2022   HGB 12.2 (L) 07/15/2022   HCT 38.4 07/15/2022   MCV 91 07/15/2022   PLT 237 07/15/2022    Lab Results  Component Value Date   TSH 5.042 (H) 07/08/2023   FREE T4  0.73  Radiographic Findings: NM PET (PSMA) SKULL TO MID THIGH  Result Date: 06/15/2023 CLINICAL DATA:  Prostate carcinoma status post radiation treatment. Rising PSA 1.31. EXAM: NUCLEAR  MEDICINE PET SKULL BASE TO THIGH TECHNIQUE: 8.7 mCi Flotufolastat (Posluma) was injected intravenously. Full-ring PET imaging was performed from the skull base to thigh after the radiotracer. CT data was obtained and used for attenuation correction and anatomic localization. COMPARISON:  None Available. FINDINGS: NECK No radiotracer activity in neck lymph nodes. Incidental CT finding: None. CHEST No radiotracer accumulation within mediastinal or hilar lymph nodes. No suspicious pulmonary nodules on the CT scan. Incidental CT finding: Mild atelectasis in the RIGHT middle lobe. ABDOMEN/PELVIS Prostate: Initial markers noted in the prostate gland. No abnormal radiotracer accumulation Lymph nodes: No abnormal radiotracer accumulation within pelvic or abdominal nodes. Liver: No evidence of liver metastasis. Incidental CT finding: None. SKELETON No focal activity to suggest skeletal metastasis. IMPRESSION: 1. No evidence of prostate cancer recurrence in the prostate gland. 2. No evidence of metastatic adenopathy in the pelvis or periaortic retroperitoneum. 3. No evidence of visceral metastasis or skeletal metastasis. Electronically Signed   By: Genevive Bi M.D.   On: 06/15/2023 15:43    Impression/Plan:    1) Head and Neck Cancer Status: NED  2) Nutritional Status: No issues  3) Risk Factors: The patient has been educated about risk factors including alcohol and tobacco abuse; they understand that avoidance  of alcohol and tobacco is important to prevent recurrences as well as other cancers - not smoking any substances whatsoever.  4) Swallowing: No issues  5)  Thyroid function:  Lab Results  Component Value Date   TSH 5.042 (H) 07/08/2023  Free T4 0.73 today - within normal limits, continue to check annually.   6) F/u with me in 12 mo for routine follow up.     On date of service, in total, I spent 30 minutes on this encounter. Patient was seen in person.  Note was signed after date of encounter.   Minutes pertaining to date of encounter only.  _____________________________________   Joyice Faster, PA    Lonie Peak, MD

## 2023-07-11 NOTE — Progress Notes (Signed)
Free T4 was normal 

## 2023-09-22 ENCOUNTER — Other Ambulatory Visit (INDEPENDENT_AMBULATORY_CARE_PROVIDER_SITE_OTHER): Payer: BC Managed Care – PPO

## 2023-09-22 ENCOUNTER — Encounter: Payer: Self-pay | Admitting: Physician Assistant

## 2023-09-22 ENCOUNTER — Ambulatory Visit (INDEPENDENT_AMBULATORY_CARE_PROVIDER_SITE_OTHER): Payer: BC Managed Care – PPO | Admitting: Physician Assistant

## 2023-09-22 DIAGNOSIS — M79605 Pain in left leg: Secondary | ICD-10-CM

## 2023-09-22 NOTE — Progress Notes (Signed)
Office Visit Note   Patient: Juan Lewis           Date of Birth: March 23, 1971           MRN: [id number] Visit Date: 09/22/2023              Requested by: No referring provider defined for this encounter. PCP: Pcp, No   Assessment & Plan: Visit Diagnoses:  1. Pain in left leg     Plan: 1 month history of left groin to knee pain.  No history of injury.  Do not see any concerning lesions on x-ray.  Seems more like impingement when he lifts his leg up.  He is already been examined by his primary care provider for other causes of the pain.  I would like for him to try some physical therapy.  Will refer him today.  He will follow-up in 1 month if he still has continued symptoms could consider an ultrasound-guided injection or an MRI  Follow-Up Instructions: No follow-ups on file.   Orders:  No orders of the defined types were placed in this encounter.  No orders of the defined types were placed in this encounter.     Procedures: No procedures performed   Clinical Data: No additional findings.   Subjective: No chief complaint on file.   HPI patient is a pleasant 52 year old gentleman with a chief complaint of left hip to knee pain.  He said this began approximately 3-1/2 weeks ago.  He says sitting is okay but walking is painful.  He has been taking a muscle relaxer and naproxen for 3 weeks.  He tried ice with no relief.  He does not have any prior history or injury no popping no numbness or tingling.  Review of Systems  All other systems reviewed and are negative.    Objective: Vital Signs: There were no vitals taken for this visit.  Physical Exam Constitutional:      Appearance: Normal appearance.  Pulmonary:     Effort: Pulmonary effort is normal.  Skin:    General: Skin is warm and dry.  Neurological:     Mental Status: He is alert.  Psychiatric:        Mood and Affect: Mood normal.        Behavior: Behavior normal.    Ortho Exam Examination of his left  leg he has no swelling no erythema compartments are soft and compressible he is neurovascularly intact.  No lower back pains no radicular findings he has good strength.  Does have pain with flexion of his hip no real pain with logrolling no pain below the knee.  He has good strength. Specialty Comments:  No specialty comments available.  Imaging: No results found.   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   Tobacco user 12/08/2018   Atypical chest pain 12/05/2017   Past Medical History:  Diagnosis Date   GERD (gastroesophageal reflux disease)    hx of   History of radiation therapy    2020/2021 for vocal  cord lesion and prostate cancer   Hoarseness 2020   Lesion of vocal cord 2020   Prostate cancer (HCC) 2021    Family History  Problem Relation Age of Onset   Prostate cancer Neg Hx    Breast cancer Neg Hx    Colon cancer Neg Hx    Pancreatic cancer Neg Hx    Colon polyps Neg Hx    Esophageal cancer Neg Hx    Rectal cancer Neg Hx    Stomach cancer Neg Hx     Past Surgical History:  Procedure Laterality Date   MICROLARYNGOSCOPY N/A 03/23/2019   Procedure: MICROLARYNGOSCOPY WITH EXCISION OF VOCAL CORD LESION;  Surgeon: Drema Halon, MD;  Location: [facility];  Service: ENT;  Laterality: N/A;   MICROLARYNGOSCOPY N/A 07/15/2020   Procedure: DIAGNOSTIC LARYNGOSCOPY WITH MICROSCOPE AND BIOPSY;  Surgeon: Drema Halon, MD;  Location: [facility];  Service:  ENT;  Laterality: N/A;   MICROLARYNGOSCOPY WITH CO2 LASER AND EXCISION OF VOCAL CORD LESION N/A 07/28/2020   Procedure: MICROLARYNGOSCOPY WITH CO2 LASER AND EXCISION OF VOCAL CORD LESION;  Surgeon: Drema Halon, MD;  Location: [facility];  Service: ENT;  Laterality: N/A;   PROSTATE BIOPSY     Social History   Occupational History   Not on file  Tobacco Use   Smoking status: Former    Current packs/day: 0.00    Average packs/day: 0.5 packs/day for 30.5 years (15.3 ttl pk-yrs)    Types: Cigarettes    Start date: 36    Quit date: 04/20/2019    Years since quitting: 4.4   Smokeless tobacco: Never   Tobacco comments:    he quit when he started radiation. 06/15/19  Vaping Use   Vaping status: Never Used  Substance and Sexual Activity   Alcohol use: Not Currently    Alcohol/week: 14.0 standard drinks of alcohol    Types: 14 Glasses of wine per week   Drug use: No   Sexual activity: Not Currently

## 2023-09-22 NOTE — Addendum Note (Signed)
Addended by: Albertha Ghee E on: 09/22/2023 01:37 PM   Modules accepted: Orders

## 2023-10-07 ENCOUNTER — Ambulatory Visit: Payer: BC Managed Care – PPO | Admitting: Physician Assistant

## 2023-10-07 ENCOUNTER — Encounter: Payer: Self-pay | Admitting: Physician Assistant

## 2023-10-07 DIAGNOSIS — M79605 Pain in left leg: Secondary | ICD-10-CM | POA: Diagnosis not present

## 2023-10-07 MED ORDER — MELOXICAM 15 MG PO TABS: 15.0000 mg | ORAL_TABLET | Freq: Every day | ORAL | 0 refills | Status: DC

## 2023-10-07 NOTE — Progress Notes (Signed)
Office Visit Note   Patient: Juan Lewis           Date of Birth: [birth year]           MRN: [id number] Visit Date: 10/07/2023              Requested by: No referring provider defined for this encounter. PCP: Pcp, No  Chief Complaint  Patient presents with   Left Leg - Pain, Follow-up      HPI: Patient is a pleasant 52 year old gentleman who I saw a couple weeks ago for left groin and hip pain.  He was referred by his primary care who was concerned he had a flexor strain.  He also had some findings consistent with some degenerative changes of his hip joint.  As a start I recommended physical therapy.  He is due to start this next week.  He comes in today because he says the pain is not improved  Assessment & Plan: Visit Diagnoses: Left hip pain  Plan: Will place him on meloxicam should come in and see me in 2 weeks for follow-up and begin physical therapy as was previously discussed  Follow-Up Instructions: Return in about 1 week (around 10/14/2023).   Ortho Exam  Patient is alert, oriented, no adenopathy, well-dressed, normal affect, normal respiratory effort. Lamination he has no redness no erythema.  He does have pain focally in the groin is recreated a little bit with logrolling but also with resisted flexion.  No back pain no radicular findings strength is intact  Imaging: No results found. No images are attached to the encounter.  Labs: Lab Results  Component Value Date   HGBA1C 5.8 (H) 07/15/2022   HGBA1C 5.7 (H) 02/23/2022   HGBA1C 5.9 (H) 01/30/2021   LABURIC 6.0 07/15/2022   LABURIC 6.2 02/23/2022   LABURIC 7.1 01/30/2021     Lab Results  Component Value Date   ALBUMIN 4.4 07/15/2022   ALBUMIN 5.0 (H) 02/23/2022   ALBUMIN 4.4 12/31/2021    No results found for: "MG" Lab Results  Component Value Date   VD25OH 31.3 07/15/2022    No results found for: "PREALBUMIN"    Latest Ref Rng & Units 07/15/2022   12:39 PM 02/23/2022    9:50 AM 12/31/2021     9:05 AM  CBC EXTENDED  WBC 3.4 - 10.8 x10E3/uL 3.6  3.4  3.6   RBC 4.14 - 5.80 x10E6/uL 4.23  4.45  4.65   Hemoglobin 13.0 - 17.7 g/dL 18.8  41.6  60.6   HCT 37.5 - 51.0 % 38.4  40.9  42.8   Platelets 150 - 450 x10E3/uL 237  222  225   NEUT# 1.4 - 7.0 x10E3/uL 1.9  1.2  1.4   Lymph# 0.7 - 3.1 x10E3/uL 1.3  1.8  1.8      There is no height or weight on file to calculate BMI.  Orders:  No orders of the defined types were placed in this encounter.  Meds ordered this encounter  Medications   meloxicam (MOBIC) 15 MG tablet    Sig: Take 1 tablet (15 mg total) by mouth daily.    Dispense:  30 tablet    Refill:  0     Procedures: No procedures performed  Clinical Data: No additional findings.  ROS:  All other systems negative, except as noted in the HPI. Review of Systems  Objective: Vital Signs: There were no vitals taken for this visit.  Specialty Comments:  No specialty comments  available.  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   Tobacco user 12/08/2018   Atypical chest pain 12/05/2017   Past Medical History:  Diagnosis Date   GERD (gastroesophageal reflux disease)    hx of   History of radiation therapy    2020/2021 for vocal cord lesion and prostate cancer   Hoarseness 2020   Lesion of vocal cord 2020   Prostate cancer (HCC) 2021    Family History  Problem Relation Age of Onset   Prostate cancer Neg Hx    Breast cancer Neg Hx    Colon cancer Neg Hx    Pancreatic cancer  Neg Hx    Colon polyps Neg Hx    Esophageal cancer Neg Hx    Rectal cancer Neg Hx    Stomach cancer Neg Hx     Past Surgical History:  Procedure Laterality Date   MICROLARYNGOSCOPY N/A 03/23/2019   Procedure: MICROLARYNGOSCOPY WITH EXCISION OF VOCAL CORD LESION;  Surgeon: Drema Halon, MD;  Location: [facility];  Service: ENT;  Laterality: N/A;   MICROLARYNGOSCOPY N/A 07/15/2020   Procedure: DIAGNOSTIC LARYNGOSCOPY WITH MICROSCOPE AND BIOPSY;  Surgeon: Drema Halon, MD;  Location: [facility];  Service: ENT;  Laterality: N/A;   MICROLARYNGOSCOPY WITH CO2 LASER AND EXCISION OF VOCAL CORD LESION N/A 07/28/2020   Procedure: MICROLARYNGOSCOPY WITH CO2 LASER AND EXCISION OF VOCAL CORD LESION;  Surgeon: Drema Halon, MD;  Location: [facility];  Service: ENT;  Laterality: N/A;   PROSTATE BIOPSY     Social History   Occupational History   Not on file  Tobacco Use   Smoking status: Former    Current packs/day: 0.00    Average packs/day: 0.5 packs/day for 30.5 years (15.3 ttl pk-yrs)    Types: Cigarettes    Start date: 36    Quit date: 04/20/2019    Years since quitting: 4.4   Smokeless tobacco: Never   Tobacco comments:    he quit when he started radiation. 06/15/19  Vaping Use   Vaping status: Never Used  Substance and Sexual Activity   Alcohol use: Not Currently    Alcohol/week: 14.0 standard drinks of alcohol    Types: 14 Glasses of wine per week   Drug use: No   Sexual activity: Not Currently

## 2023-10-11 ENCOUNTER — Ambulatory Visit (INDEPENDENT_AMBULATORY_CARE_PROVIDER_SITE_OTHER): Payer: BC Managed Care – PPO | Admitting: Physical Therapy

## 2023-10-11 ENCOUNTER — Encounter: Payer: Self-pay | Admitting: Physical Therapy

## 2023-10-11 DIAGNOSIS — R29898 Other symptoms and signs involving the musculoskeletal system: Secondary | ICD-10-CM

## 2023-10-11 DIAGNOSIS — M79605 Pain in left leg: Secondary | ICD-10-CM

## 2023-10-11 DIAGNOSIS — M6281 Muscle weakness (generalized): Secondary | ICD-10-CM

## 2023-10-11 NOTE — Therapy (Signed)
 OUTPATIENT PHYSICAL THERAPY EVALUATION   Patient Name: Juan Lewis MRN: [id number] DOB:1971-07-30, 52 y.o., male Today's Date: 10/11/2023  END OF SESSION:  PT End of Session - 10/11/23 1008     Visit Number 1    Number of Visits 6    Date for PT Re-Evaluation 11/22/23    Authorization Type [employer] 20% coinsurance    PT Start Time 0930    PT Stop Time 1000    PT Time Calculation (min) 30 min    Activity Tolerance Patient tolerated treatment well    Behavior During Therapy WFL for tasks assessed/performed             Past Medical History:  Diagnosis Date   GERD (gastroesophageal reflux disease)    hx of   History of radiation therapy    2020/2021 for vocal cord lesion and prostate cancer   Hoarseness 2020   Lesion of vocal cord 2020   Prostate cancer (HCC) 2021   Past Surgical History:  Procedure Laterality Date   MICROLARYNGOSCOPY N/A 03/23/2019   Procedure: MICROLARYNGOSCOPY WITH EXCISION OF VOCAL CORD LESION;  Surgeon: Ethyl Lonni BRAVO, MD;  Location: [facility];  Service: ENT;  Laterality: N/A;   MICROLARYNGOSCOPY N/A 07/15/2020   Procedure: DIAGNOSTIC LARYNGOSCOPY WITH MICROSCOPE AND BIOPSY;  Surgeon: Ethyl Lonni BRAVO, MD;  Location: [facility];  Service: ENT;  Laterality: N/A;   MICROLARYNGOSCOPY WITH CO2 LASER AND EXCISION OF VOCAL CORD LESION N/A 07/28/2020   Procedure: MICROLARYNGOSCOPY WITH CO2 LASER AND EXCISION OF VOCAL CORD LESION;  Surgeon: Ethyl Lonni BRAVO, MD;  Location: [facility];  Service: ENT;  Laterality: N/A;   PROSTATE BIOPSY     Patient Active Problem List   Diagnosis Date Noted   Pain in left leg 09/22/2023   Hemoglobin low 08/03/2021   Chronic hoarseness 06/09/2021   Degeneration of thoracic intervertebral disc 06/09/2021   History of malignant neoplasm of larynx 06/09/2021   Leukopenia 06/09/2021   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   Neutropenia (HCC) 06/09/2021   Prediabetes 06/09/2021    Carcinoma of prostate (HCC) 12/04/2019   Raised prostate specific antigen 09/28/2019   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   Tobacco user 12/08/2018   Atypical chest pain 12/05/2017    PCP: Pcp, No  REFERRING PROVIDER: Persons, Juan Dragon, PA  REFERRING DIAG: M79.605 (ICD-10-CM) - Pain in left leg  Rationale for Evaluation and Treatment: Rehabilitation  THERAPY DIAG:  Muscle weakness (generalized) - Plan: PT plan of care cert/re-cert  Pain in left leg - Plan: PT plan of care cert/re-cert  Other symptoms and signs involving the musculoskeletal system - Plan: PT plan of care cert/re-cert  ONSET DATE: ~ 6-7 weeks ago   SUBJECTIVE:  SUBJECTIVE STATEMENT: Patient is a pleasant 52 year old gentleman who I saw a couple weeks ago for left groin and hip pain which radiates to his knee. He reports the medication doesn't seem to be helping his pain, which was a muscle relaxer and naproxen.  He denies any injury.  PERTINENT HISTORY:  Hx prostate cancer  PAIN:  Are you having pain? Yes: NPRS scale: currently 5; up to 7, at best 3 Pain location: Lt groin/buttock Pain description: aching Aggravating factors: unsure; maybe walking Relieving factors: sitting  PRECAUTIONS:  None  RED FLAGS: None   WEIGHT BEARING RESTRICTIONS:  No  FALLS:  Has patient fallen in last 6 months? 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, no heavy lifting  PLOF:  Independent and Leisure: watching movies, prior to pain was walking on treadmill  PATIENT GOALS:  Improve  pain   OBJECTIVE:   DIAGNOSTIC FINDINGS:  X-rays: mild Lt hip OA  PATIENT SURVEYS:  10/11/23 FOTO 85 (predicted 89)  COGNITIVE STATUS: Within functional limits for tasks assessed   SENSATION: WFL  POSTURE:  rounded shoulders and forward head   GAIT: 10/11/23 Comments: decreased hip flexion on Lt with swing needing to rely on trunk rotation to advance LLE   PALPATION: 10/11/23 pain at proximal quads/rectus femoris with trigger points noted   LUMBAR ROM:   Active  A/PROM  eval  Flexion WNL  Extension Limited 25%   (Blank rows = not tested)   LOWER EXTREMITY ROM:  10/11/23: bil LEs WNL     LOWER EXTREMITY MMT:   10/11/23: increased pain with supine SLR and unable to lift against gravity on Rt  Active  Right eval Left eval  Hip flexion 5/5 5/5  Hip extension    Hip abduction    Hip adduction 3/5 (with pain)   Hip internal rotation 5/5 5/5  Hip external rotation 5/5 5/5   (Blank rows = not tested)     TREATMENT:                                                                                                                              DATE:  10/11/23 See HEP - demonstrated with trial reps performed PRN; mod cues for comprehension     PATIENT EDUCATION:  Education details: HEP Person educated: Patient Education method: Programmer, Multimedia, Demonstration, and Handouts Education comprehension: verbalized understanding, returned demonstration, and needs further education  HOME EXERCISE PROGRAM: Access Code: [email or web address] URL: [email or web address] Date: 10/11/2023 Prepared by: Corean Ku  Exercises - Hip Flexor Stretch at [facility] of Bed  - 2 x daily - 7 x weekly - 1 sets - 3 reps - 30 sec hold - Seated Hip Flexor Stretch  - 2 x daily - 7 x weekly - 1 sets -  3 reps - 30 sec hold - Seated Hip Adductor Stretch  - 2 x daily - 7 x weekly - 1 sets - 3 reps - 30 sec hold - Small Range Straight Leg Raise  - 2 x daily - 7 x weekly - 1 sets - 10  reps   ASSESSMENT:  CLINICAL IMPRESSION: Patient is a 52 y.o. male who was seen today for physical therapy evaluation and treatment for Lt hip pain. He demonstrates increased pain and gait abnormalities, with decreased strength and flexibility affecting functional mobility.  He will benefit from PT to address deficits listed.     OBJECTIVE IMPAIRMENTS: Abnormal gait, decreased activity tolerance, decreased mobility, difficulty walking, decreased ROM, decreased strength, increased fascial restrictions, increased muscle spasms, impaired flexibility, postural dysfunction, and pain.   ACTIVITY LIMITATIONS: bending, standing, squatting, stairs, transfers, and locomotion level  PARTICIPATION LIMITATIONS: meal prep, cleaning, laundry, community activity, and occupation  PERSONAL FACTORS: 1-2 comorbidities: hx prostate cancer  are also affecting patient's functional outcome.   REHAB POTENTIAL: Good  CLINICAL DECISION MAKING: Evolving/moderate complexity  EVALUATION COMPLEXITY: Moderate   GOALS: Goals reviewed with patient? Yes  SHORT TERM GOALS: Target date: 11/01/2023   Independent with initial HEP Goal status: INITIAL  LONG TERM GOALS: Target date: 11/22/2023  Independent with final HEP Goal status: INITIAL  2.  FOTO score improved to 89 Goal status: INITIAL  3.  Lt hip adductor strength improved to 4/5 without pain for improved function Goal status: INIITAL  4.  Report pain < 3/10 with standing and walking for improved function Goal status: INITIAL  5.  Demonstrate ability to perform Lt SLR without increase in pain for improved function Goal status: INITIAL   PLAN:  PT FREQUENCY: 1x/week  PT DURATION: 6 weeks  PLANNED INTERVENTIONS: 97164- PT Re-evaluation, 97110-Therapeutic exercises, 97530- Therapeutic activity, [id number]- Neuromuscular re-education, 97535- Self Care, 02859- Manual therapy, [id number]- Gait training, [phone]- Aquatic Therapy, 97014- Electrical stimulation  (unattended), [id number]- Ultrasound, [id number]- Traction (mechanical), [id number]- Ionotophoresis 4mg /ml Dexamethasone , Patient/Family education, Stair training, Taping, Dry Needling, Joint mobilization, Joint manipulation, Spinal manipulation, Spinal mobilization, Cryotherapy, and Moist heat.  PLAN FOR NEXT SESSION: review HEP, work on hip strength as able   NEXT MD VISIT: 10/21/23   Corean JULIANNA Ku, PT, DPT 10/11/23 10:20 AM

## 2023-10-19 ENCOUNTER — Telehealth: Payer: Self-pay | Admitting: Physical Therapy

## 2023-10-19 ENCOUNTER — Encounter: Payer: BC Managed Care – PPO | Admitting: Physical Therapy

## 2023-10-19 NOTE — Telephone Encounter (Signed)
 Spoke with pt as he did not show for his PT appt today.  He states he tried to call to cancel but was on hold for 45 min.  Provided direct line for PT, as well as offered other appointments but pt unable to come at available times.    Reminded of next scheduled appt.   Corean JULIANNA Ku, PT, DPT 10/19/23 11:11 AM

## 2023-10-21 ENCOUNTER — Ambulatory Visit: Payer: BC Managed Care – PPO | Admitting: Physician Assistant

## 2023-10-25 ENCOUNTER — Ambulatory Visit: Payer: BC Managed Care – PPO | Admitting: Physician Assistant

## 2023-10-26 ENCOUNTER — Ambulatory Visit (INDEPENDENT_AMBULATORY_CARE_PROVIDER_SITE_OTHER): Payer: BC Managed Care – PPO | Admitting: Physical Therapy

## 2023-10-26 ENCOUNTER — Encounter: Payer: Self-pay | Admitting: Physical Therapy

## 2023-10-26 ENCOUNTER — Encounter: Payer: BC Managed Care – PPO | Admitting: Physical Therapy

## 2023-10-26 DIAGNOSIS — M79605 Pain in left leg: Secondary | ICD-10-CM

## 2023-10-26 DIAGNOSIS — R29898 Other symptoms and signs involving the musculoskeletal system: Secondary | ICD-10-CM

## 2023-10-26 DIAGNOSIS — M6281 Muscle weakness (generalized): Secondary | ICD-10-CM

## 2023-10-26 NOTE — Progress Notes (Signed)
 Otolaryngology Clinic Note  HPI:    Chief Complaint  Patient presents with  . Hoarse    Patient states his voice feels dry and throat is sore x1 week   Juan Lewis is a 53 y.o. male who presents as an established patient for one week of sore throat and dryness.   Patient last seen in our office on 05/16/2023 by Dr. Llewellyn for routine head and neck cancer surveillance. He is followed by Dr. Izell. History of T2N0M0 right anterior vocal cord cancer, status post completion of radiation therapy in 2020 with subsequent laser cordectomy (of the right true vocal cord and anterior commissure) performed by Dr. Ethyl, 2021. Fiberoptic exam of the larynx performed in August 2024 demonstrated a stable, small defect of the anterior portion of the right true vocal fold from previous laser cordectomy. No change compared to prior photo documentation performed 07/29/22. No mass or other lesion identified.   Patient returns today. States the throat feels sore and irritated. States he has been hoarse since undergoing radiation therapy. His voice seems to get more hoarse later in the day. He is swallowing without difficulty. No fever, night sweats, chills or unintentional weight loss. No otalgia, cough or hemoptysis.   Currently no use of tobacco products.   PMH/Meds/All/SocHx/FamHx/ROS:   Past Medical History:  Diagnosis Date  . Cancer (CMS/HCC)     No past surgical history on file.  No family history of bleeding disorders, wound healing problems or difficulty with anesthesia.       Current Outpatient Medications:  .  fluticasone propionate (FLONASE) 50 mcg/spray nasal spray, Spray 1 spray every day by intranasal route., Disp: , Rfl:  .  sildenafiL (VIAGRA) 100 mg tablet, Take 100 mg by mouth daily as needed., Disp: , Rfl:   A complete ROS was performed with pertinent positives/negatives noted in the HPI. The remainder of the ROS are negative.    Physical Exam:    There were no vitals  taken for this visit.   General Awake, at baseline alertness during examination.  Eyes No scleral icterus or conjunctival hemorrhage. Globe position appears normal. EOMI.   Right Ear EAC patent, TM intact w/o inflammation. Middle ear well aerated.   Left Ear EAC patent, TM intact w/o inflammation. Middle ear well aerated.   Nose Patent, no polyps or masses seen on anterior rhinoscopy.  Oral cavity No mucosal lesions or tumors seen. Tongue midline.   Oropharynx Symmetric tonsils.   Neck No abnormal cervical lymphadenopathy. No thyromegaly. No thyroid  masses palpated.  Cardio-vascular No cyanosis.  Pulmonary No audible stridor. Breathing easily with no labor.  Neuro Symmetric facial movement.   Psychiatry Appropriate affect and mood for clinic visit.   Independent Review of Additional Tests or Records:   Medical records.   Procedures:   Flexible Laryngoscopy Procedure Note:  Date: 10/26/23  Indications: sore throat, history of glottic cancer.  Risks, benefits and clinical relevance of the study were discussed. The patient understands and agrees to proceed.   Procedure: After adequate topical anesthetic was applied, 4 mm flexible laryngoscope was passed through the right nasal cavity without difficulty. Flexible laryngoscopy shows patent anterior nasal cavity without crusting, no discharge or infection.  Normal base of tongue and supraglottis. Normal vocal cord mobility without vocal cord nodule, mass, polyp or tumor. Stable-appearing right anterior true vocal cord defect. Hypopharynx normal without mass, pooling of secretions or aspiration. Moderate post-glottic edema.  Patient tolerated procedure without complication or difficulty.   Velia Pry, PA-C  Attending physician present: Dr. Carlie        Impression & Plans:  Juan Lewis is a 53 y.o. male, status post RT for right vocal cord cancer treated in 2020 with subsequent laser cordectomy in 2021. Here for one  week of sore throat and throat dryness. Fiberoptic exam of the larynx demonstrates no signs of recurrent disease or infection. Stable defect noted of the right anterior vocal cord; photos are similar to those documented in August 2024. Recommend watchful waiting. Recommend staying well hydrated and considering use of humidification in the bedroom, particularly during the cooler months. Follow up if symptoms are persistent or worsening.   Patient agrees with the plan.    Velia Pry, PA-C [facility]

## 2023-10-26 NOTE — Therapy (Addendum)
 OUTPATIENT PHYSICAL THERAPY TREATMENT DISCHARGE SUMMARY   Patient Name: Juan Lewis MRN: [id number] DOB:[birth year], 53 y.o., male Today's Date: 10/26/2023  END OF SESSION:  PT End of Session - 10/26/23 1300     Visit Number 2    Number of Visits 6    Date for PT Re-Evaluation 11/22/23    Authorization Type [employer] 20% coinsurance    PT Start Time 1300    PT Stop Time 1329    PT Time Calculation (min) 29 min    Activity Tolerance Patient tolerated treatment well    Behavior During Therapy WFL for tasks assessed/performed              Past Medical History:  Diagnosis Date   GERD (gastroesophageal reflux disease)    hx of   History of radiation therapy    2020/2021 for vocal cord lesion and prostate cancer   Hoarseness 2020   Lesion of vocal cord 2020   Prostate cancer (HCC) 2021   Past Surgical History:  Procedure Laterality Date   MICROLARYNGOSCOPY N/A 03/23/2019   Procedure: MICROLARYNGOSCOPY WITH EXCISION OF VOCAL CORD LESION;  Surgeon: Prescott Brodie, MD;  Location: [facility];  Service: ENT;  Laterality: N/A;   MICROLARYNGOSCOPY N/A 07/15/2020   Procedure: DIAGNOSTIC LARYNGOSCOPY WITH MICROSCOPE AND BIOPSY;  Surgeon: Prescott Brodie, MD;  Location: [facility];  Service: ENT;  Laterality: N/A;   MICROLARYNGOSCOPY WITH CO2 LASER AND EXCISION OF VOCAL CORD LESION N/A 07/28/2020   Procedure: MICROLARYNGOSCOPY WITH CO2 LASER AND EXCISION OF VOCAL CORD LESION;  Surgeon: Prescott Brodie, MD;  Location: [facility];  Service: ENT;  Laterality: N/A;   PROSTATE BIOPSY     Patient Active Problem List   Diagnosis Date Noted   Pain in left leg 09/22/2023   Hemoglobin low 08/03/2021   Chronic hoarseness 06/09/2021   Degeneration of thoracic intervertebral disc 06/09/2021   History of malignant neoplasm of larynx 06/09/2021   Leukopenia 06/09/2021   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   Neutropenia (HCC) 06/09/2021   Prediabetes  06/09/2021   Carcinoma of prostate (HCC) 12/04/2019   Raised prostate specific antigen 09/28/2019   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  Noticed a difference after about 3 days of exercise.  Occasionally still experiences pain.  Walking doesn't seem to aggravate it.    PERTINENT HISTORY:  Hx prostate cancer  PAIN:  Are you having pain? Yes: NPRS scale: currently 0; up to 3, at best 0/10 Pain location: Lt groin/buttock Pain description: aching Aggravating factors: unsure Relieving factors: sitting  PRECAUTIONS:  None  RED FLAGS: None   WEIGHT BEARING RESTRICTIONS:  No  FALLS:  Has patient fallen in last 6 months? 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, no heavy lifting  PLOF:  Independent and Leisure: watching movies, prior to pain was walking on treadmill  PATIENT GOALS:  Improve pain   OBJECTIVE:   DIAGNOSTIC FINDINGS:  X-rays: mild Lt hip OA  PATIENT SURVEYS:  10/11/23 FOTO 85 (predicted 89) 10/26/23: FOTO 97  COGNITIVE STATUS: Within  functional limits for tasks assessed   SENSATION: WFL  POSTURE:  rounded shoulders and forward head   GAIT: 10/11/23 Comments: decreased hip flexion on Lt with swing needing to rely on trunk rotation to advance LLE   PALPATION: 10/11/23 pain at proximal quads/rectus femoris with trigger points noted   LUMBAR ROM:   Active  A/PROM  eval  Flexion WNL  Extension Limited 25%   (Blank rows = not tested)   LOWER EXTREMITY ROM:  10/11/23: bil LEs WNL     LOWER EXTREMITY MMT:   10/11/23: increased pain with supine SLR and unable to lift against gravity on Lt 10/26/23: able to perform SLR on Lt without pain or issues  Active  Left eval Right eval Left 10/26/23  Hip flexion 5/5 5/5   Hip extension     Hip abduction     Hip adduction 3/5 (with pain)  5/5  (no pain)  Hip internal rotation 5/5 5/5   Hip external rotation 5/5 5/5    (Blank rows = not tested)     TREATMENT:                                                                                                                              DATE:  10/26/23 Review of HEP - and pt demonstrated good understanding; now able to peform SLR without difficulty SLS on LLE without pain or imbalance noted MMT - see above for details Review of FOTO with pt - assist needed due to ESL    10/11/23 See HEP - demonstrated with trial reps performed PRN; mod cues for comprehension     PATIENT EDUCATION:  Education details: HEP Person educated: Patient Education method: Programmer, multimedia, Facilities manager, and Handouts Education comprehension: verbalized understanding, returned demonstration, and needs further education  HOME EXERCISE PROGRAM: Access  Code: [email or web address] URL: [email or web address] Date: 10/11/2023 Prepared by: Casimer Clear  Exercises - Hip Flexor Stretch at [facility] of Bed  - 2 x daily - 7 x weekly - 1 sets - 3 reps - 30 sec hold - Seated Hip Flexor Stretch  - 2 x daily - 7 x weekly - 1 sets - 3 reps -  30 sec hold - Seated Hip Adductor Stretch  - 2 x daily - 7 x weekly - 1 sets - 3 reps - 30 sec hold - Small Range Straight Leg Raise  - 2 x daily - 7 x weekly - 1 sets - 10 reps   ASSESSMENT:  CLINICAL IMPRESSION: Pt has met all goals at this time and feel skilled PT is not longer needed.  We will hold PT and pt will call if symptoms return.  Otherwise will d/c PT.   OBJECTIVE IMPAIRMENTS: Abnormal gait, decreased activity tolerance, decreased mobility, difficulty walking, decreased ROM, decreased strength, increased fascial restrictions, increased muscle spasms, impaired flexibility, postural dysfunction, and pain.   ACTIVITY LIMITATIONS: bending, standing, squatting, stairs, transfers, and locomotion level  PARTICIPATION LIMITATIONS: meal prep, cleaning, laundry, community activity, and occupation  PERSONAL FACTORS: 1-2 comorbidities: hx prostate cancer  are also affecting patient's functional outcome.   REHAB POTENTIAL: Good  CLINICAL DECISION MAKING: Evolving/moderate complexity  EVALUATION COMPLEXITY: Moderate   GOALS: Goals reviewed with patient? Yes  SHORT TERM GOALS: Target date: 11/01/2023   Independent with initial HEP Goal status: MET 10/26/23  LONG TERM GOALS: Target date: 11/22/2023  Independent with final HEP Goal status: MET (to date) 10/26/23  2.  FOTO score improved to 89 Goal status: MET 10/26/23  3.  Lt hip adductor strength improved to 4/5 without pain for improved function Goal status: MET 10/26/23  4.  Report pain < 3/10 with standing and walking for improved function Goal status: MET 10/26/23  5.  Demonstrate ability to perform Lt SLR without increase in pain for improved function Goal status: MET 10/26/23   PLAN:  PT FREQUENCY: 1x/week  PT DURATION: 6 weeks  PLANNED INTERVENTIONS: 97164- PT Re-evaluation, 97110-Therapeutic exercises, 97530- Therapeutic activity, 97112- Neuromuscular re-education, 97535- Self Care, 20254- Manual therapy,  [id number]- Gait training, [id number]- Aquatic Therapy, 97014- Electrical stimulation (unattended), [id number]- Ultrasound, [id number]- Traction (mechanical), [id number]- Ionotophoresis 4mg /ml Dexamethasone , Patient/Family education, Stair training, Taping, Dry Needling, Joint mobilization, Joint manipulation, Spinal manipulation, Spinal mobilization, Cryotherapy, and Moist heat.  PLAN FOR NEXT SESSION: hold PT, d/c or reassess based on if he returns    NEXT MD VISIT: 10/27/23   Marley Simmers, PT, DPT 10/26/23 1:29 PM   PHYSICAL THERAPY DISCHARGE SUMMARY  Visits from Start of Care: 2  Current functional level related to goals / functional outcomes: See above   Remaining deficits: See above   Education / Equipment: HEP   Patient agrees to discharge. Patient goals were met. Patient is being discharged due to meeting the stated rehab goals.   Marley Simmers, PT, DPT 01/30/24 3:02 PM

## 2023-10-27 ENCOUNTER — Ambulatory Visit: Payer: BC Managed Care – PPO | Admitting: Physician Assistant

## 2023-10-27 ENCOUNTER — Encounter: Payer: Self-pay | Admitting: Physician Assistant

## 2023-10-27 DIAGNOSIS — M79605 Pain in left leg: Secondary | ICD-10-CM

## 2023-10-27 NOTE — Progress Notes (Signed)
Office Visit Note   Patient: Juan Lewis           Date of Birth: July 23, 1971           MRN: [id number] Visit Date: 10/27/2023              Requested by: No referring provider defined for this encounter. PCP: Pcp, No  Chief Complaint  Patient presents with   Left Hip - Follow-up      HPI: Patient presents today for follow-up on his left hip pain.  He has been doing physical therapy and reports significant decrease in his symptoms.  Assessment & Plan: Visit Diagnoses: Left hip pain  Plan: Patient is fairly asymptomatic today may follow-up with me as needed encouraged him to continue home exercise program  Follow-Up Instructions: No follow-ups on file.   Ortho Exam  Patient is alert, oriented, no adenopathy, well-dressed, normal affect, normal respiratory effort. Examination of his left hip no erythema no negative straight leg rise neurovascularly intact no pain with logrolling or internal/external rotation of his hip  Imaging: No results found. No images are attached to the encounter.  Labs: Lab Results  Component Value Date   HGBA1C 5.8 (H) 07/15/2022   HGBA1C 5.7 (H) 02/23/2022   HGBA1C 5.9 (H) 01/30/2021   LABURIC 6.0 07/15/2022   LABURIC 6.2 02/23/2022   LABURIC 7.1 01/30/2021     Lab Results  Component Value Date   ALBUMIN 4.4 07/15/2022   ALBUMIN 5.0 (H) 02/23/2022   ALBUMIN 4.4 12/31/2021    No results found for: "MG" Lab Results  Component Value Date   VD25OH 31.3 07/15/2022    No results found for: "PREALBUMIN"    Latest Ref Rng & Units 07/15/2022   12:39 PM 02/23/2022    9:50 AM 12/31/2021    9:05 AM  CBC EXTENDED  WBC 3.4 - 10.8 x10E3/uL 3.6  3.4  3.6   RBC 4.14 - 5.80 x10E6/uL 4.23  4.45  4.65   Hemoglobin 13.0 - 17.7 g/dL 11.9  14.7  82.9   HCT 37.5 - 51.0 % 38.4  40.9  42.8   Platelets 150 - 450 x10E3/uL 237  222  225   NEUT# 1.4 - 7.0 x10E3/uL 1.9  1.2  1.4   Lymph# 0.7 - 3.1 x10E3/uL 1.3  1.8  1.8      There is no height or  weight on file to calculate BMI.  Orders:  No orders of the defined types were placed in this encounter.  No orders of the defined types were placed in this encounter.    Procedures: No procedures performed  Clinical Data: No additional findings.  ROS:  All other systems negative, except as noted in the HPI. Review of Systems  Objective: Vital Signs: There were no vitals taken for this visit.  Specialty Comments:  No specialty comments available.  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   Tobacco user 12/08/2018   Atypical chest pain 12/05/2017   Past Medical History:  Diagnosis Date  GERD (gastroesophageal reflux disease)    hx of   History of radiation therapy    2020/2021 for vocal cord lesion and prostate cancer   Hoarseness 2020   Lesion of vocal cord 2020   Prostate cancer (HCC) 2021    Family History  Problem Relation Age of Onset   Prostate cancer Neg Hx    Breast cancer Neg Hx    Colon cancer Neg Hx    Pancreatic cancer Neg Hx    Colon polyps Neg Hx    Esophageal cancer Neg Hx    Rectal cancer Neg Hx    Stomach cancer Neg Hx     Past Surgical History:  Procedure Laterality Date   MICROLARYNGOSCOPY N/A 03/23/2019   Procedure: MICROLARYNGOSCOPY WITH EXCISION OF VOCAL CORD LESION;  Surgeon: Drema Halon, MD;  Location: [facility];  Service: ENT;  Laterality: N/A;   MICROLARYNGOSCOPY N/A 07/15/2020   Procedure: DIAGNOSTIC LARYNGOSCOPY WITH  MICROSCOPE AND BIOPSY;  Surgeon: Drema Halon, MD;  Location: [facility];  Service: ENT;  Laterality: N/A;   MICROLARYNGOSCOPY WITH CO2 LASER AND EXCISION OF VOCAL CORD LESION N/A 07/28/2020   Procedure: MICROLARYNGOSCOPY WITH CO2 LASER AND EXCISION OF VOCAL CORD LESION;  Surgeon: Drema Halon, MD;  Location: [facility];  Service: ENT;  Laterality: N/A;   PROSTATE BIOPSY     Social History   Occupational History   Not on file  Tobacco Use   Smoking status: Former    Current packs/day: 0.00    Average packs/day: 0.5 packs/day for 30.5 years (15.3 ttl pk-yrs)    Types: Cigarettes    Start date: 65    Quit date: 04/20/2019    Years since quitting: 4.5   Smokeless tobacco: Never   Tobacco comments:    he quit when he started radiation. 06/15/19  Vaping Use   Vaping status: Never Used  Substance and Sexual Activity   Alcohol use: Not Currently    Alcohol/week: 14.0 standard drinks of alcohol    Types: 14 Glasses of wine per week   Drug use: No   Sexual activity: Not Currently

## 2023-11-02 ENCOUNTER — Encounter: Payer: BC Managed Care – PPO | Admitting: Physical Therapy

## 2023-11-15 ENCOUNTER — Other Ambulatory Visit: Payer: Self-pay | Admitting: Physician Assistant

## 2023-12-02 ENCOUNTER — Ambulatory Visit: Payer: BC Managed Care – PPO | Admitting: Physician Assistant

## 2023-12-02 ENCOUNTER — Encounter: Payer: Self-pay | Admitting: Physician Assistant

## 2023-12-02 ENCOUNTER — Other Ambulatory Visit (INDEPENDENT_AMBULATORY_CARE_PROVIDER_SITE_OTHER): Payer: BC Managed Care – PPO

## 2023-12-02 DIAGNOSIS — M5440 Lumbago with sciatica, unspecified side: Secondary | ICD-10-CM

## 2023-12-02 DIAGNOSIS — M79605 Pain in left leg: Secondary | ICD-10-CM

## 2023-12-02 MED ORDER — MELOXICAM 15 MG PO TABS: 15.0000 mg | ORAL_TABLET | Freq: Every day | ORAL | 0 refills | Status: DC

## 2023-12-02 NOTE — Progress Notes (Signed)
 Office Visit Note   Patient: Juan Lewis           Date of Birth: 07-Jan-1971           MRN: [id number] Visit Date: 12/02/2023              Requested by: No referring provider defined for this encounter. PCP: Pcp, No  Chief Complaint  Patient presents with   Left Leg - Pain      HPI: Patient is a 53 year old gentleman who have seen in the past for left groin pain.  He has taken meloxicam and did do physical therapy and it got significantly better.  He comes in today because the pain has returned no new injury.  He has not been taking any particular medication and admits he has not been keeping up with the home exercises.  Denies any fever or chills denies any back pain  Assessment & Plan: Visit Diagnoses:  1. Acute bilateral low back pain with sciatica, sciatica laterality unspecified   2. Pain in left leg     Plan: Patient has had a greater than 6-week history of left hip pain does not seem to be his lower back.  He is also had physical therapy and return of symptoms.  Also has a history of prostate cancer.  Did have a PET scan not that long ago.  But given his continued symptoms we will order an MRI I have also given him a new prescription for meloxicam  Follow-Up Instructions: Return if symptoms worsen or fail to improve.   Ortho Exam  Patient is alert, oriented, no adenopathy, well-dressed, normal affect, normal respiratory effort. Examination of his left hip he is neuro vastly intact has good dorsiflexion plantarflexion eversion inversion extension flexion of his legs does have some pain with manipulation of his left hip no radicular findings  Imaging: XR Lumbar Spine 2-3 Views Result Date: 12/02/2023 Three-view radiographs of the lumbar spine well-preserved joint spacing no acute fractures or lytic lesions noted  No images are attached to the encounter.  Labs: Lab Results  Component Value Date   HGBA1C 5.8 (H) 07/15/2022   HGBA1C 5.7 (H) 02/23/2022   HGBA1C 5.9  (H) 01/30/2021   LABURIC 6.0 07/15/2022   LABURIC 6.2 02/23/2022   LABURIC 7.1 01/30/2021     Lab Results  Component Value Date   ALBUMIN 4.4 07/15/2022   ALBUMIN 5.0 (H) 02/23/2022   ALBUMIN 4.4 12/31/2021    No results found for: "MG" Lab Results  Component Value Date   VD25OH 31.3 07/15/2022    No results found for: "PREALBUMIN"    Latest Ref Rng & Units 07/15/2022   12:39 PM 02/23/2022    9:50 AM 12/31/2021    9:05 AM  CBC EXTENDED  WBC 3.4 - 10.8 x10E3/uL 3.6  3.4  3.6   RBC 4.14 - 5.80 x10E6/uL 4.23  4.45  4.65   Hemoglobin 13.0 - 17.7 g/dL 86.5  78.4  69.6   HCT 37.5 - 51.0 % 38.4  40.9  42.8   Platelets 150 - 450 x10E3/uL 237  222  225   NEUT# 1.4 - 7.0 x10E3/uL 1.9  1.2  1.4   Lymph# 0.7 - 3.1 x10E3/uL 1.3  1.8  1.8      There is no height or weight on file to calculate BMI.  Orders:  Orders Placed This Encounter  Procedures   XR Lumbar Spine 2-3 Views   MR Hip Left w/o contrast   Meds  ordered this encounter  Medications   meloxicam (MOBIC) 15 MG tablet    Sig: Take 1 tablet (15 mg total) by mouth daily.    Dispense:  30 tablet    Refill:  0     Procedures: No procedures performed  Clinical Data: No additional findings.  ROS:  All other systems negative, except as noted in the HPI. Review of Systems  Objective: Vital Signs: There were no vitals taken for this visit.  Specialty Comments:  No specialty comments available.  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   Neutropenia (HCC) 06/09/2021   Prediabetes 06/09/2021   Carcinoma of prostate (HCC) 12/04/2019   Raised prostate specific antigen 09/28/2019   Malignant neoplasm of glottis (HCC)  03/28/2019   Tobacco user 12/08/2018   Atypical chest pain 12/05/2017   Past Medical History:  Diagnosis Date   GERD (gastroesophageal reflux disease)    hx of   History of radiation therapy    2020/2021 for vocal cord lesion and prostate cancer   Hoarseness 2020   Lesion of vocal cord 2020   Prostate cancer (HCC) 2021    Family History  Problem Relation Age of Onset   Prostate cancer Neg Hx    Breast cancer Neg Hx    Colon cancer Neg Hx    Pancreatic cancer Neg Hx    Colon polyps Neg Hx    Esophageal cancer Neg Hx    Rectal cancer Neg Hx    Stomach cancer Neg Hx     Past Surgical History:  Procedure Laterality Date   MICROLARYNGOSCOPY N/A 03/23/2019   Procedure: MICROLARYNGOSCOPY WITH EXCISION OF VOCAL CORD LESION;  Surgeon: Drema Halon, MD;  Location: [facility];  Service: ENT;  Laterality: N/A;   MICROLARYNGOSCOPY N/A 07/15/2020   Procedure: DIAGNOSTIC LARYNGOSCOPY WITH MICROSCOPE AND BIOPSY;  Surgeon: Drema Halon, MD;  Location: [facility];  Service: ENT;  Laterality: N/A;   MICROLARYNGOSCOPY WITH CO2 LASER AND EXCISION OF VOCAL CORD LESION N/A 07/28/2020   Procedure: MICROLARYNGOSCOPY WITH CO2 LASER AND EXCISION OF VOCAL CORD LESION;  Surgeon: Drema Halon, MD;  Location: [facility];  Service: ENT;  Laterality: N/A;   PROSTATE BIOPSY     Social History   Occupational History   Not on file  Tobacco Use   Smoking status: Former    Current packs/day: 0.00    Average packs/day: 0.5 packs/day for 30.5 years (15.3 ttl pk-yrs)    Types: Cigarettes    Start date: 84    Quit date: 04/20/2019    Years since quitting: 4.6   Smokeless tobacco: Never   Tobacco comments:    he quit when he started radiation. 06/15/19  Vaping Use   Vaping status: Never Used  Substance and Sexual Activity   Alcohol use: Not Currently    Alcohol/week: 14.0 standard drinks of alcohol    Types: 14 Glasses of wine per week   Drug use: No    Sexual activity: Not Currently

## 2023-12-23 ENCOUNTER — Ambulatory Visit
Admission: RE | Admit: 2023-12-23 | Discharge: 2023-12-23 | Disposition: A | Discharge status: Home / Self Care | Payer: BC Managed Care – PPO | Source: Ambulatory Visit | Attending: Physician Assistant | Admitting: Physician Assistant

## 2023-12-23 DIAGNOSIS — M79605 Pain in left leg: Secondary | ICD-10-CM

## 2023-12-29 ENCOUNTER — Other Ambulatory Visit: Payer: Self-pay | Admitting: Physician Assistant

## 2024-01-10 ENCOUNTER — Encounter: Payer: Self-pay | Admitting: Orthopaedic Surgery

## 2024-01-10 ENCOUNTER — Ambulatory Visit: Admitting: Orthopaedic Surgery

## 2024-01-10 DIAGNOSIS — M87052 Idiopathic aseptic necrosis of left femur: Secondary | ICD-10-CM | POA: Diagnosis not present

## 2024-01-10 NOTE — Progress Notes (Signed)
 Office Visit Note   Patient: Juan Lewis           Date of Birth: Aug 28, 1971           MRN: [id number] Visit Date: 01/10/2024              Requested by: No referring provider defined for this encounter. PCP: Pcp, No   Assessment & Plan: Visit Diagnoses:  1. Avascular necrosis of bone of hip, left (HCC)     Plan: Patient is a 53 year old gentleman with left hip avascular necrosis.  MRI shows significant edema throughout the entire femoral head and neck region.  These findings were reviewed with the patient and treatment options were discussed.  Total hip replacement handout provided.  He will think about his options and get back in touch with [place].  Follow-up as needed.  Follow-Up Instructions: No follow-ups on file.   Orders:  No orders of the defined types were placed in this encounter.  No orders of the defined types were placed in this encounter.     Procedures: No procedures performed   Clinical Data: No additional findings.   Subjective: Chief Complaint  Patient presents with   Left Hip - Pain    HPI Patient is a 53 year old gentleman referral from [facility] for surgical consultation for left hip avascular necrosis.  Pain started last November.  Has groin and thigh pain.  Denies any trauma. Review of Systems  Constitutional: Negative.   HENT: Negative.    Eyes: Negative.   Respiratory: Negative.    Cardiovascular: Negative.   Gastrointestinal: Negative.   Endocrine: Negative.   Genitourinary: Negative.   Skin: Negative.   Allergic/Immunologic: Negative.   Neurological: Negative.   Hematological: Negative.   Psychiatric/Behavioral: Negative.    All other systems reviewed and are negative.    Objective: Vital Signs: There were no vitals taken for this visit.  Physical Exam Vitals and nursing note reviewed.  Constitutional:      Appearance: He is well-developed.  Pulmonary:     Effort: Pulmonary effort is normal.  Abdominal:     Palpations:  Abdomen is soft.  Skin:    General: Skin is warm.  Neurological:     Mental Status: He is alert and oriented to person, place, and time.  Psychiatric:        Behavior: Behavior normal.        Thought Content: Thought content normal.        Judgment: Judgment normal.     Ortho Exam Examination of the left hip shows some pain with hip rotation.  Slight antalgic gait.  Lumbar spine exam is normal. Specialty Comments:  No specialty comments available.  Imaging: No results found.   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   Tobacco user 12/08/2018   Atypical chest pain 12/05/2017   Past Medical History:  Diagnosis Date   GERD (gastroesophageal reflux disease)    hx of   History of radiation  therapy    2020/2021 for vocal cord lesion and prostate cancer   Hoarseness 2020   Lesion of vocal cord 2020   Prostate cancer (HCC) 2021    Family History  Problem Relation Age of Onset   Prostate cancer Neg Hx    Breast cancer Neg Hx    Colon cancer Neg Hx    Pancreatic cancer Neg Hx    Colon polyps Neg Hx    Esophageal cancer Neg Hx    Rectal cancer Neg Hx    Stomach cancer Neg Hx     Past Surgical History:  Procedure Laterality Date   MICROLARYNGOSCOPY N/A 03/23/2019   Procedure: MICROLARYNGOSCOPY WITH EXCISION OF VOCAL CORD LESION;  Surgeon: Drema Halon, MD;  Location: [facility];  Service: ENT;  Laterality: N/A;    MICROLARYNGOSCOPY N/A 07/15/2020   Procedure: DIAGNOSTIC LARYNGOSCOPY WITH MICROSCOPE AND BIOPSY;  Surgeon: Drema Halon, MD;  [facility];  Service: ENT;  Laterality: N/A;   MICROLARYNGOSCOPY WITH CO2 LASER AND EXCISION OF VOCAL CORD LESION N/A 07/28/2020   Procedure: MICROLARYNGOSCOPY WITH CO2 LASER AND EXCISION OF VOCAL CORD LESION;  Surgeon: Drema Halon, MD;  Location: [facility];  Service: ENT;  Laterality: N/A;   PROSTATE BIOPSY     Social History   Occupational History   Not on file  Tobacco Use   Smoking status: Former    Current packs/day: 0.00    Average packs/day: 0.5 packs/day for 30.5 years (15.3 ttl pk-yrs)    Types: Cigarettes    Start date: 18    Quit date: 04/20/2019    Years since quitting: 4.7   Smokeless tobacco: Never   Tobacco comments:    he quit when he started radiation. 06/15/19  Vaping Use   Vaping status: Never Used  Substance and Sexual Activity   Alcohol use: Not Currently    Alcohol/week: 14.0 standard drinks of alcohol    Types: 14 Glasses of wine per week   Drug use: No   Sexual activity: Not Currently

## 2024-04-04 ENCOUNTER — Telehealth: Payer: Self-pay | Admitting: Orthopaedic Surgery

## 2024-04-04 NOTE — Telephone Encounter (Signed)
 Patient called to say he is ready to move forward with surgery

## 2024-04-05 ENCOUNTER — Telehealth: Payer: Self-pay | Admitting: Orthopaedic Surgery

## 2024-04-05 NOTE — Telephone Encounter (Signed)
 Patient left voicemail message stating he would like to have surgery with Dr. Jerri.  Please provide surgery sheet if surgery is in order.

## 2024-04-06 ENCOUNTER — Telehealth: Payer: Self-pay | Admitting: Orthopaedic Surgery

## 2024-04-06 NOTE — Telephone Encounter (Signed)
 Pt called stating he want to move forward with surgery and waiting for a call back. Please call pt about this matter at [phone].

## 2024-04-12 ENCOUNTER — Other Ambulatory Visit (INDEPENDENT_AMBULATORY_CARE_PROVIDER_SITE_OTHER): Payer: Self-pay

## 2024-04-12 ENCOUNTER — Ambulatory Visit: Admitting: Physician Assistant

## 2024-04-12 ENCOUNTER — Encounter: Payer: Self-pay | Admitting: Physician Assistant

## 2024-04-12 DIAGNOSIS — M87052 Idiopathic aseptic necrosis of left femur: Secondary | ICD-10-CM | POA: Diagnosis not present

## 2024-04-12 NOTE — Progress Notes (Signed)
 Office Visit Note   Patient: Juan Lewis           Date of Birth: 1971-07-16           MRN: [id number] Visit Date: 04/12/2024              Requested by: No referring provider defined for this encounter. PCP: Pcp, No   Assessment & Plan: Visit Diagnoses:  1. Avascular necrosis of bone of hip, left (HCC)     Plan: Impression is severe left hip degenerative joint disease secondary to Avascular necrosis.  Patient has attempted conservative treatment for at least 6 consecutive weeks within the past 12 weeks, including but not limited to physical therapy, home exercise program, NSAIDs, activity modification, and/or corticosteroid injections. Despite these efforts, symptoms have not improved or have worsened. Conservative measures have been deemed unsuccessful at this time. After a detailed discussion covering diagnosis and treatment options--including the risks, benefits, alternatives, and potential complications of surgical and nonsurgical management--the patient elected to proceed with surgery.  Current anticoagulants: none Postop anticoagulation: Eliquis- history of prostate cancer Diabetic: No  Prior DVT/PE: No Tobacco use: No Clearances needed for surgery: pcp Anticipate discharge dispo: home   Follow-Up Instructions: Return if symptoms worsen or fail to improve.   Orders:  Orders Placed This Encounter  Procedures   XR Pelvis 1-2 Views   No orders of the defined types were placed in this encounter.     Procedures: No procedures performed   Clinical Data: No additional findings.   Subjective: Chief Complaint  Patient presents with   Left Hip - Pain    HPI patient is a pleasant 53 year old gentleman who comes in today with  worsening left hip pain.  History of AVN with severe bony edema seen on recent MRI.  He is here today to discuss surgical intervention.  He is having pain primarily to the groin which is constant.  He takes occasional Tylenol .  He has been  ambulating with a cane.  Review of Systems as detailed in HPI.  All others reviewed and are negative.   Objective: Vital Signs: There were no vitals taken for this visit.  Physical Exam well-developed well-nourished gentleman in no acute distress.  Alert and oriented x 3.  Ortho Exam left hip exam: Marked pain with logroll and FADIR.  He is neurovascularly intact distally.  Specialty Comments:  No specialty comments available.  Imaging: XR Pelvis 1-2 Views Result Date: 04/12/2024 X-rays of the left hip reveal avascular necrosis with moderately decreased joint space.    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   Tobacco user 12/08/2018   Atypical chest pain 12/05/2017   Past Medical History:  Diagnosis Date   GERD (gastroesophageal reflux disease)    hx of   History of radiation therapy    2020/2021 for vocal cord lesion and prostate cancer   Hoarseness 2020   Lesion of vocal cord 2020   Prostate cancer (HCC) 2021  Family History  Problem Relation Age of Onset   Prostate cancer Neg Hx    Breast cancer Neg Hx    Colon cancer Neg Hx    Pancreatic cancer Neg Hx    Colon polyps Neg Hx    Esophageal cancer Neg Hx    Rectal cancer Neg Hx    Stomach cancer Neg Hx     Past Surgical History:  Procedure Laterality Date   MICROLARYNGOSCOPY N/A 03/23/2019   Procedure: MICROLARYNGOSCOPY WITH EXCISION OF VOCAL CORD LESION;  Surgeon:  Ethyl Lonni BRAVO, MD;  Location: [facility];  Service: ENT;  Laterality: N/A;   MICROLARYNGOSCOPY N/A 07/15/2020   Procedure: DIAGNOSTIC LARYNGOSCOPY WITH MICROSCOPE AND BIOPSY;  Surgeon: Ethyl Lonni BRAVO, MD;  Location: [facility];  Service: ENT;  Laterality: N/A;   MICROLARYNGOSCOPY WITH CO2 LASER AND EXCISION OF VOCAL CORD LESION N/A 07/28/2020   Procedure: MICROLARYNGOSCOPY WITH CO2 LASER AND EXCISION OF VOCAL CORD LESION;  Surgeon: Ethyl Lonni BRAVO, MD;  Location: [facility];  Service: ENT;  Laterality: N/A;   PROSTATE BIOPSY     Social History   Occupational History   Not on file  Tobacco Use   Smoking status: Former    Current packs/day: 0.00    Average packs/day: 0.5 packs/day for 30.5 years (15.3 ttl pk-yrs)    Types: Cigarettes    Start date: 46    Quit date: 04/20/2019    Years since quitting: 4.9   Smokeless tobacco: Never   Tobacco comments:    he quit when he started radiation. 06/15/19  Vaping Use   Vaping status: Never Used  Substance and Sexual Activity   Alcohol use: Not Currently    Alcohol/week: 14.0 standard drinks of alcohol    Types: 14 Glasses of wine per week   Drug use: No   Sexual activity: Not Currently

## 2024-04-25 ENCOUNTER — Telehealth: Payer: Self-pay | Admitting: Orthopaedic Surgery

## 2024-04-25 NOTE — Telephone Encounter (Signed)
Note made.  

## 2024-04-25 NOTE — Telephone Encounter (Signed)
 yes

## 2024-04-25 NOTE — Telephone Encounter (Signed)
 Pt walked into office stating that his employer is requesting a note that states he is able to work with no restrictions up until his surgery on 05/24/24.    Pt will wait for the note.

## 2024-05-07 ENCOUNTER — Other Ambulatory Visit: Payer: Self-pay | Admitting: Physician Assistant

## 2024-05-07 MED ORDER — METHOCARBAMOL 750 MG PO TABS: 750.0000 mg | ORAL_TABLET | Freq: Three times a day (TID) | ORAL | 2 refills | Status: DC | PRN

## 2024-05-07 MED ORDER — ASPIRIN 81 MG PO CHEW: 81.0000 mg | CHEWABLE_TABLET | Freq: Two times a day (BID) | ORAL | 0 refills | Status: AC

## 2024-05-07 MED ORDER — DOCUSATE SODIUM 100 MG PO CAPS: 100.0000 mg | ORAL_CAPSULE | Freq: Every day | ORAL | 2 refills | Status: AC | PRN

## 2024-05-07 MED ORDER — OXYCODONE-ACETAMINOPHEN 5-325 MG PO TABS: 1.0000 | ORAL_TABLET | Freq: Four times a day (QID) | ORAL | 0 refills | Status: DC | PRN

## 2024-05-07 MED ORDER — ONDANSETRON HCL 4 MG PO TABS: 4.0000 mg | ORAL_TABLET | Freq: Three times a day (TID) | ORAL | 0 refills | Status: DC | PRN

## 2024-05-15 NOTE — Patient Instructions (Signed)
 SURGICAL WAITING ROOM VISITATION Patients having surgery or a procedure may have no more than 2 support people in the waiting area - these visitors may rotate in the visitor waiting room.   Due to an increase in RSV and influenza rates and associated hospitalizations, children ages 99 and under may not visit patients in [facility] hospitals. If the patient needs to stay at the hospital during part of their recovery, the visitor guidelines for inpatient rooms apply.  PRE-OP VISITATION  Pre-op nurse will coordinate an appropriate time for 1 support person to accompany the patient in pre-op.  This support person may not rotate.  This visitor will be contacted when the time is appropriate for the visitor to come back in the pre-op area.  Please refer to the [facility] website for the visitor guidelines for Inpatients (after your surgery is over and you are in a regular room).  You are not required to quarantine at this time prior to your surgery. However, you must do this: Hand Hygiene often Do NOT share personal items Notify your provider if you are in close contact with someone who has COVID or you develop fever 100.4 or greater, new onset of sneezing, cough, sore throat, shortness of breath or body aches.  If you test positive for Covid or have been in contact with anyone that has tested positive in the last 10 days please notify you surgeon.    Your procedure is scheduled on: 05/24/24   Report to [facility] Main Entrance: [place] entrance where the [employer] is available.   Report to admitting at: 7:20 AM  Call this number if you have any questions or problems the morning of surgery [phone]  FOLLOW ANY ADDITIONAL PRE OP INSTRUCTIONS YOU RECEIVED FROM YOUR SURGEON'S OFFICE!!!  Do not eat food after Midnight the night prior to your surgery/procedure.  After Midnight you may have the following liquids until: 6:50 AM DAY OF SURGERY  Clear Liquid Diet Water  Black  Coffee (sugar ok, NO MILK/CREAM OR CREAMERS)  Tea (sugar ok, NO MILK/CREAM OR CREAMERS) regular and decaf                             Plain Jell-O  with no fruit (NO RED)                                           Fruit ices (not with fruit pulp, NO RED)                                     Popsicles (NO RED)                                                                  Juice: NO CITRUS JUICES: only apple, WHITE grape, WHITE cranberry Sports drinks like Gatorade or Powerade (NO RED)   The day of surgery:  Drink ONE (1) Pre-Surgery Clear Ensure at : 6:50 AM the morning of surgery. Drink in one sitting. Do not sip.  This drink was given  to you during your hospital pre-op appointment visit. Nothing else to drink after completing the Pre-Surgery Clear Ensure or G2 : No candy, chewing gum or throat lozenges.    Oral Hygiene is also important to reduce your risk of infection.        Remember - BRUSH YOUR TEETH THE MORNING OF SURGERY WITH YOUR REGULAR TOOTHPASTE  Do NOT smoke after Midnight the night before surgery.  STOP TAKING all Vitamins, Herbs and supplements 1 week before your surgery.   Take ONLY these medicines the morning of surgery with A SIP OF WATER : NONE  If You have been diagnosed with Sleep Apnea - Bring CPAP mask and tubing day of surgery. We will provide you with a CPAP machine on the day of your surgery.                   You may not have any metal on your body including hair pins, jewelry, and body piercing  Do not wear lotions, powders, perfumes / cologne, or deodorant  Men may shave face and neck.  Contacts, Hearing Aids, dentures or bridgework may not be worn into surgery. DENTURES WILL BE REMOVED PRIOR TO SURGERY PLEASE DO NOT APPLY Poly grip OR ADHESIVES!!!  You may bring a small overnight bag with you on the day of surgery, only pack items that are not valuable. [facility] IS NOT RESPONSIBLE   FOR VALUABLES THAT ARE LOST OR STOLEN.   Patients discharged on the  day of surgery will not be allowed to drive home.  Someone NEEDS to stay with you for the first 24 hours after anesthesia.  Do not bring your home medications to the hospital. The Pharmacy will dispense medications listed on your medication list to you during your admission in the Hospital.  Special Instructions: Bring a copy of your healthcare power of attorney and living will documents the day of surgery, if you wish to have them scanned into your [facility] Records- EPIC  Please read over the following fact sheets you were given: IF YOU HAVE QUESTIONS ABOUT YOUR PRE-OP INSTRUCTIONS, PLEASE CALL [phone]   PATIENT SIGNATURE_________________________________  NURSE SIGNATURE__________________________________  ________________________________________________________________________    Pre-operative 5 CHG Bath Instructions   You can play a key role in reducing the risk of infection after surgery. Your skin needs to be as free of germs as possible. You can reduce the number of germs on your skin by washing with CHG (chlorhexidine  gluconate) soap before surgery. CHG is an antiseptic soap that kills germs and continues to kill germs even after washing.   DO NOT use if you have an allergy to chlorhexidine /CHG or antibacterial soaps. If your skin becomes reddened or irritated, stop using the CHG and notify one of our RNs at [phone].   Please shower with the CHG soap starting 4 days before surgery using the following schedule:     Please keep in mind the following:  DO NOT shave, including legs and underarms, starting the day of your first shower.   You may shave your face at any point before/day of surgery.  Place clean sheets on your bed the day you start using CHG soap. Use a clean washcloth (not used since being washed) for each shower. DO NOT sleep with pets once you start using the CHG.   CHG Shower Instructions:  If you choose to wash your hair and private area, wash  first with your normal shampoo/soap.  After you use shampoo/soap, rinse your hair and  body thoroughly to remove shampoo/soap residue.  Turn the water  OFF and apply about 3 tablespoons (45 ml) of CHG soap to a CLEAN washcloth.  Apply CHG soap ONLY FROM YOUR NECK DOWN TO YOUR TOES (washing for 3-5 minutes)  DO NOT use CHG soap on face, private areas, open wounds, or sores.  Pay special attention to the area where your surgery is being performed.  If you are having back surgery, having someone wash your back for you may be helpful. Wait 2 minutes after CHG soap is applied, then you may rinse off the CHG soap.  Pat dry with a clean towel  Put on clean clothes/pajamas   If you choose to wear lotion, please use ONLY the CHG-compatible lotions on the back of this paper.     Additional instructions for the day of surgery: DO NOT APPLY any lotions, deodorants, cologne, or perfumes.   Put on clean/comfortable clothes.  Brush your teeth.  Ask your nurse before applying any prescription medications to the skin.   CHG Compatible Lotions   Aveeno Moisturizing lotion  Cetaphil Moisturizing Cream  Cetaphil Moisturizing Lotion  Clairol Herbal Essence Moisturizing Lotion, Dry Skin  Clairol Herbal Essence Moisturizing Lotion, Extra Dry Skin  Clairol Herbal Essence Moisturizing Lotion, Normal Skin  Curel Age Defying Therapeutic Moisturizing Lotion with Alpha Hydroxy  Curel Extreme Care Body Lotion  Curel Soothing Hands Moisturizing Hand Lotion  Curel Therapeutic Moisturizing Cream, Fragrance-Free  Curel Therapeutic Moisturizing Lotion, Fragrance-Free  Curel Therapeutic Moisturizing Lotion, Original Formula  Eucerin Daily Replenishing Lotion  Eucerin Dry Skin Therapy Plus Alpha Hydroxy Crme  Eucerin Dry Skin Therapy Plus Alpha Hydroxy Lotion  Eucerin Original Crme  Eucerin Original Lotion  Eucerin Plus Crme Eucerin Plus Lotion  Eucerin TriLipid Replenishing Lotion  Keri Anti-Bacterial Hand  Lotion  Keri Deep Conditioning Original Lotion Dry Skin Formula Softly Scented  Keri Deep Conditioning Original Lotion, Fragrance Free Sensitive Skin Formula  Keri Lotion Fast Absorbing Fragrance Free Sensitive Skin Formula  Keri Lotion Fast Absorbing Softly Scented Dry Skin Formula  Keri Original Lotion  Keri Skin Renewal Lotion Keri Silky Smooth Lotion  Keri Silky Smooth Sensitive Skin Lotion  Nivea Body Creamy Conditioning Oil  Nivea Body Extra Enriched Lotion  Nivea Body Original Lotion  Nivea Body Sheer Moisturizing Lotion Nivea Crme  Nivea Skin Firming Lotion  NutraDerm 30 Skin Lotion  NutraDerm Skin Lotion  NutraDerm Therapeutic Skin Cream  NutraDerm Therapeutic Skin Lotion  ProShield Protective Hand Cream  Provon moisturizing lotion   Incentive Spirometer  An incentive spirometer is a tool that can help keep your lungs clear and active. This tool measures how well you are filling your lungs with each breath. Taking long deep breaths may help reverse or decrease the chance of developing breathing (pulmonary) problems (especially infection) following: A long period of time when you are unable to move or be active. BEFORE THE PROCEDURE  If the spirometer includes an indicator to show your best effort, your nurse or respiratory therapist will set it to a desired goal. If possible, sit up straight or lean slightly forward. Try not to slouch. Hold the incentive spirometer in an upright position. INSTRUCTIONS FOR USE  Sit on the edge of your bed if possible, or sit up as far as you can in bed or on a chair. Hold the incentive spirometer in an upright position. Breathe out normally. Place the mouthpiece in your mouth and seal your lips tightly around it. Breathe in slowly and as deeply  as possible, raising the piston or the ball toward the top of the column. Hold your breath for 3-5 seconds or for as long as possible. Allow the piston or ball to fall to the bottom of the  column. Remove the mouthpiece from your mouth and breathe out normally. Rest for a few seconds and repeat Steps 1 through 7 at least 10 times every 1-2 hours when you are awake. Take your time and take a few normal breaths between deep breaths. The spirometer may include an indicator to show your best effort. Use the indicator as a goal to work toward during each repetition. After each set of 10 deep breaths, practice coughing to be sure your lungs are clear. If you have an incision (the cut made at the time of surgery), support your incision when coughing by placing a pillow or rolled up towels firmly against it. Once you are able to get out of bed, walk around indoors and cough well. You may stop using the incentive spirometer when instructed by your caregiver.  RISKS AND COMPLICATIONS Take your time so you do not get dizzy or light-headed. If you are in pain, you may need to take or ask for pain medication before doing incentive spirometry. It is harder to take a deep breath if you are having pain. AFTER USE Rest and breathe slowly and easily. It can be helpful to keep track of a log of your progress. Your caregiver can provide you with a simple table to help with this. If you are using the spirometer at home, follow these instructions: SEEK MEDICAL CARE IF:  You are having difficultly using the spirometer. You have trouble using the spirometer as often as instructed. Your pain medication is not giving enough relief while using the spirometer. You develop fever of 100.5 F (38.1 C) or higher. SEEK IMMEDIATE MEDICAL CARE IF:  You cough up bloody sputum that had not been present before. You develop fever of 102 F (38.9 C) or greater. You develop worsening pain at or near the incision site. MAKE SURE YOU:  Understand these instructions. Will watch your condition. Will get help right away if you are not doing well or get worse. Document Released: 02/07/2007 Document Revised: 12/20/2011  Document Reviewed: 04/10/2007 [facility] Patient Information 2014 [place], [place].   ________________________________________________________________________

## 2024-05-17 ENCOUNTER — Encounter (HOSPITAL_COMMUNITY)
Admission: RE | Admit: 2024-05-17 | Discharge: 2024-05-17 | Disposition: A | Discharge status: Home / Self Care | Payer: Self-pay | Source: Ambulatory Visit | Attending: Orthopaedic Surgery | Admitting: Orthopaedic Surgery

## 2024-05-17 ENCOUNTER — Other Ambulatory Visit: Payer: Self-pay

## 2024-05-17 ENCOUNTER — Encounter (HOSPITAL_COMMUNITY): Payer: Self-pay

## 2024-05-17 VITALS — BP 112/78 | HR 55 | Temp 98.6°F | Ht 68.0 in | Wt 153.0 lb

## 2024-05-17 DIAGNOSIS — M87052 Idiopathic aseptic necrosis of left femur: Secondary | ICD-10-CM | POA: Diagnosis not present

## 2024-05-17 DIAGNOSIS — Z01812 Encounter for preprocedural laboratory examination: Secondary | ICD-10-CM | POA: Diagnosis present

## 2024-05-17 DIAGNOSIS — Z01818 Encounter for other preprocedural examination: Secondary | ICD-10-CM

## 2024-05-17 HISTORY — DX: Unspecified osteoarthritis, unspecified site: M19.90

## 2024-05-17 HISTORY — DX: Prediabetes: R73.03

## 2024-05-17 LAB — SURGICAL PCR SCREEN
MRSA, PCR: NEGATIVE
Staphylococcus aureus: NEGATIVE

## 2024-05-17 LAB — CBC
HCT: 40.9 % (ref 39.0–52.0)
Hemoglobin: 13 g/dL (ref 13.0–17.0)
MCH: 29.1 pg (ref 26.0–34.0)
MCHC: 31.8 g/dL (ref 30.0–36.0)
MCV: 91.7 fL (ref 80.0–100.0)
Platelets: 276 K/uL (ref 150–400)
RBC: 4.46 MIL/uL (ref 4.22–5.81)
RDW: 14.5 % (ref 11.5–15.5)
WBC: 5.1 K/uL (ref 4.0–10.5)
nRBC: 0 % (ref 0.0–0.2)

## 2024-05-17 LAB — BASIC METABOLIC PANEL WITH GFR
Anion gap: 8 (ref 5–15)
BUN: 10 mg/dL (ref 6–20)
CO2: 26 mmol/L (ref 22–32)
Calcium: 9.6 mg/dL (ref 8.9–10.3)
Chloride: 106 mmol/L (ref 98–111)
Creatinine, Ser: 0.95 mg/dL (ref 0.61–1.24)
GFR, Estimated: >60 mL/min (ref >60)
Glucose, Bld: 90 mg/dL (ref 70–99)
Potassium: 4.2 mmol/L (ref 3.5–5.1)
Sodium: 140 mmol/L (ref 135–145)

## 2024-05-17 NOTE — Progress Notes (Addendum)
 For Anesthesia: PCP - No PCP Cardiologist - N/A   Bowel Prep reminder:  Chest x-ray -  EKG -  Stress Test -  ECHO -  Cardiac Cath -  Pacemaker/ICD device last checked: Pacemaker orders received: Device Rep notified:  Spinal Cord Stimulator:N/A  Sleep Study - N/A CPAP -   Fasting Blood Sugar - N/A Checks Blood Sugar _____ times a day Date and result of last Hgb A1c-  Last dose of GLP1 agonist- N/A GLP1 instructions:   Last dose of SGLT-2 inhibitors- N/A SGLT-2 instructions:   Blood Thinner Instructions:N/A Aspirin  Instructions: Last Dose:  Activity level: Can go up a flight of stairs and activities of daily living without stopping and without chest pain and/or shortness of breath   Able to exercise without chest pain and/or shortness of breath  Anesthesia review:   Patient denies shortness of breath, fever, cough and chest pain at PAT appointment   Patient verbalized understanding of instructions that were reviewed over the telephone.

## 2024-05-24 ENCOUNTER — Encounter (HOSPITAL_COMMUNITY): Payer: Self-pay | Admitting: Orthopaedic Surgery

## 2024-05-24 ENCOUNTER — Observation Stay (HOSPITAL_COMMUNITY)

## 2024-05-24 ENCOUNTER — Other Ambulatory Visit: Payer: Self-pay

## 2024-05-24 ENCOUNTER — Ambulatory Visit (HOSPITAL_COMMUNITY)

## 2024-05-24 ENCOUNTER — Observation Stay (HOSPITAL_COMMUNITY)
Admission: RE | Admit: 2024-05-24 | Discharge: 2024-05-25 | Disposition: A | Discharge status: Home / Self Care | Payer: Self-pay | Attending: Orthopaedic Surgery | Admitting: Orthopaedic Surgery

## 2024-05-24 ENCOUNTER — Encounter (HOSPITAL_COMMUNITY)
Admission: RE | Disposition: A | Discharge status: Home / Self Care | Payer: Self-pay | Source: Home / Self Care | Attending: Orthopaedic Surgery

## 2024-05-24 ENCOUNTER — Ambulatory Visit (HOSPITAL_COMMUNITY): Payer: Self-pay | Admitting: Registered Nurse

## 2024-05-24 DIAGNOSIS — Z87891 Personal history of nicotine dependence: Secondary | ICD-10-CM | POA: Diagnosis not present

## 2024-05-24 DIAGNOSIS — Z96642 Presence of left artificial hip joint: Secondary | ICD-10-CM | POA: Diagnosis not present

## 2024-05-24 DIAGNOSIS — Z7982 Long term (current) use of aspirin: Secondary | ICD-10-CM | POA: Insufficient documentation

## 2024-05-24 DIAGNOSIS — M87052 Idiopathic aseptic necrosis of left femur: Principal | ICD-10-CM | POA: Diagnosis present

## 2024-05-24 HISTORY — PX: TOTAL HIP ARTHROPLASTY: SHX124

## 2024-05-24 LAB — TYPE AND SCREEN
ABO/RH(D): O POS
Antibody Screen: NEGATIVE

## 2024-05-24 LAB — ABO/RH: ABO/RH(D): O POS

## 2024-05-24 SURGERY — ARTHROPLASTY, HIP, TOTAL, ANTERIOR APPROACH
Anesthesia: Monitor Anesthesia Care | Site: Hip | Laterality: Left

## 2024-05-24 MED ORDER — FENTANYL CITRATE (PF) 100 MCG/2ML IJ SOLN
INTRAMUSCULAR | Status: DC | PRN
  Administered 2024-05-24: 25 ug via INTRAVENOUS

## 2024-05-24 MED ORDER — ACETAMINOPHEN 500 MG PO TABS
1000.0000 mg | ORAL_TABLET | Freq: Four times a day (QID) | ORAL | Status: DC
  Administered 2024-05-24: 1000 mg via ORAL
  Filled 2024-05-24: qty 2

## 2024-05-24 MED ORDER — DEXAMETHASONE SODIUM PHOSPHATE 10 MG/ML IJ SOLN
INTRAMUSCULAR | Status: DC | PRN
  Administered 2024-05-24: 8 mg via INTRAVENOUS

## 2024-05-24 MED ORDER — OXYCODONE HCL 5 MG PO TABS: 5.0000 mg | ORAL_TABLET | Freq: Once | ORAL | Status: DC | PRN

## 2024-05-24 MED ORDER — SODIUM CHLORIDE 0.9 % IV SOLN
INTRAVENOUS | Status: DC | PRN
  Administered 2024-05-24: 2000 mg via TOPICAL

## 2024-05-24 MED ORDER — METOCLOPRAMIDE HCL 5 MG/ML IJ SOLN: 5.0000 mg | Freq: Three times a day (TID) | INTRAMUSCULAR | Status: DC | PRN

## 2024-05-24 MED ORDER — DEXAMETHASONE SODIUM PHOSPHATE 10 MG/ML IJ SOLN
INTRAMUSCULAR | Status: AC
  Filled 2024-05-24: qty 1

## 2024-05-24 MED ORDER — LACTATED RINGERS IV SOLN: INTRAVENOUS | Status: DC

## 2024-05-24 MED ORDER — ACETAMINOPHEN 325 MG PO TABS: 325.0000 mg | ORAL_TABLET | Freq: Four times a day (QID) | ORAL | Status: DC | PRN

## 2024-05-24 MED ORDER — CHLORHEXIDINE GLUCONATE 0.12 % MT SOLN
15.0000 mL | Freq: Once | OROMUCOSAL | Status: AC
  Administered 2024-05-24: 15 mL via OROMUCOSAL

## 2024-05-24 MED ORDER — ONDANSETRON HCL 4 MG/2ML IJ SOLN
INTRAMUSCULAR | Status: DC | PRN
  Administered 2024-05-24: 4 mg via INTRAVENOUS

## 2024-05-24 MED ORDER — BUPIVACAINE IN DEXTROSE 0.75-8.25 % IT SOLN
INTRATHECAL | Status: DC | PRN
  Administered 2024-05-24: 1.8 mL via INTRATHECAL

## 2024-05-24 MED ORDER — TRANEXAMIC ACID-NACL 1000-0.7 MG/100ML-% IV SOLN
1000.0000 mg | Freq: Once | INTRAVENOUS | Status: AC
  Administered 2024-05-24: 1000 mg via INTRAVENOUS

## 2024-05-24 MED ORDER — CEFAZOLIN SODIUM-DEXTROSE 2-4 GM/100ML-% IV SOLN
2.0000 g | Freq: Four times a day (QID) | INTRAVENOUS | Status: AC
  Administered 2024-05-24 (×2): 2 g via INTRAVENOUS
  Filled 2024-05-24 (×2): qty 100

## 2024-05-24 MED ORDER — DEXAMETHASONE SODIUM PHOSPHATE 10 MG/ML IJ SOLN
10.0000 mg | Freq: Once | INTRAMUSCULAR | Status: AC
  Administered 2024-05-25: 10 mg via INTRAVENOUS
  Filled 2024-05-24: qty 1

## 2024-05-24 MED ORDER — OXYCODONE HCL 5 MG/5ML PO SOLN: 5.0000 mg | Freq: Once | ORAL | Status: DC | PRN

## 2024-05-24 MED ORDER — TRANEXAMIC ACID-NACL 1000-0.7 MG/100ML-% IV SOLN
INTRAVENOUS | Status: AC
  Filled 2024-05-24: qty 100

## 2024-05-24 MED ORDER — TRANEXAMIC ACID 1000 MG/10ML IV SOLN
2000.0000 mg | INTRAVENOUS | Status: DC
  Filled 2024-05-24: qty 20

## 2024-05-24 MED ORDER — MIDAZOLAM HCL 2 MG/2ML IJ SOLN
INTRAMUSCULAR | Status: AC
  Filled 2024-05-24: qty 2

## 2024-05-24 MED ORDER — BUPIVACAINE-MELOXICAM ER 200-6 MG/7ML IJ SOLN
INTRAMUSCULAR | Status: AC
  Filled 2024-05-24: qty 1

## 2024-05-24 MED ORDER — HYDROCODONE-ACETAMINOPHEN 5-325 MG PO TABS
1.0000 | ORAL_TABLET | Freq: Three times a day (TID) | ORAL | Status: DC | PRN
  Administered 2024-05-24 – 2024-05-25 (×2): 2 via ORAL
  Filled 2024-05-24 (×2): qty 2

## 2024-05-24 MED ORDER — VANCOMYCIN HCL 1000 MG IV SOLR
INTRAVENOUS | Status: AC
  Filled 2024-05-24: qty 20

## 2024-05-24 MED ORDER — LACTATED RINGERS IV BOLUS
250.0000 mL | Freq: Once | INTRAVENOUS | Status: AC
  Administered 2024-05-24: 250 mL via INTRAVENOUS

## 2024-05-24 MED ORDER — PANTOPRAZOLE SODIUM 40 MG PO TBEC
40.0000 mg | DELAYED_RELEASE_TABLET | Freq: Every day | ORAL | Status: DC
  Administered 2024-05-24 – 2024-05-25 (×2): 40 mg via ORAL
  Filled 2024-05-24 (×2): qty 1

## 2024-05-24 MED ORDER — SODIUM CHLORIDE 0.9 % IR SOLN
Status: DC | PRN
  Administered 2024-05-24: 1000 mL

## 2024-05-24 MED ORDER — SORBITOL 70 % SOLN: 30.0000 mL | Freq: Every day | Status: DC | PRN

## 2024-05-24 MED ORDER — SODIUM CHLORIDE 0.9 % IV SOLN: INTRAVENOUS | Status: DC

## 2024-05-24 MED ORDER — EPHEDRINE 5 MG/ML INJ
INTRAVENOUS | Status: AC
  Filled 2024-05-24: qty 5

## 2024-05-24 MED ORDER — MORPHINE SULFATE (PF) 2 MG/ML IV SOLN: 0.5000 mg | Freq: Four times a day (QID) | INTRAVENOUS | Status: DC | PRN

## 2024-05-24 MED ORDER — LIDOCAINE 2% (20 MG/ML) 5 ML SYRINGE
INTRAMUSCULAR | Status: DC | PRN
  Administered 2024-05-24: 60 mg via INTRAVENOUS

## 2024-05-24 MED ORDER — ASPIRIN 81 MG PO CHEW
81.0000 mg | CHEWABLE_TABLET | Freq: Two times a day (BID) | ORAL | Status: DC
  Administered 2024-05-24 – 2024-05-25 (×2): 81 mg via ORAL
  Filled 2024-05-24 (×2): qty 1

## 2024-05-24 MED ORDER — FENTANYL CITRATE (PF) 100 MCG/2ML IJ SOLN
INTRAMUSCULAR | Status: AC
  Filled 2024-05-24: qty 2

## 2024-05-24 MED ORDER — HYDROCODONE-ACETAMINOPHEN 7.5-325 MG PO TABS: 1.0000 | ORAL_TABLET | Freq: Three times a day (TID) | ORAL | Status: DC | PRN

## 2024-05-24 MED ORDER — CEFAZOLIN SODIUM-DEXTROSE 2-4 GM/100ML-% IV SOLN
2.0000 g | INTRAVENOUS | Status: AC
  Administered 2024-05-24: 2 g via INTRAVENOUS
  Filled 2024-05-24: qty 100

## 2024-05-24 MED ORDER — PHENYLEPHRINE HCL-NACL 20-0.9 MG/250ML-% IV SOLN
INTRAVENOUS | Status: DC | PRN
  Administered 2024-05-24: 25 ug/min via INTRAVENOUS

## 2024-05-24 MED ORDER — POVIDONE-IODINE 10 % EX SWAB: 2.0000 | Freq: Once | CUTANEOUS | Status: DC

## 2024-05-24 MED ORDER — MIDAZOLAM HCL 5 MG/5ML IJ SOLN
INTRAMUSCULAR | Status: DC | PRN
  Administered 2024-05-24: 2 mg via INTRAVENOUS

## 2024-05-24 MED ORDER — PHENOL 1.4 % MT LIQD: 1.0000 | OROMUCOSAL | Status: DC | PRN

## 2024-05-24 MED ORDER — 0.9 % SODIUM CHLORIDE (POUR BTL) OPTIME
TOPICAL | Status: DC | PRN
  Administered 2024-05-24: 1000 mL

## 2024-05-24 MED ORDER — POLYETHYLENE GLYCOL 3350 17 G PO PACK: 17.0000 g | PACK | Freq: Every day | ORAL | Status: DC

## 2024-05-24 MED ORDER — ORAL CARE MOUTH RINSE: 15.0000 mL | Freq: Once | OROMUCOSAL | Status: AC

## 2024-05-24 MED ORDER — ONDANSETRON HCL 4 MG/2ML IJ SOLN
INTRAMUSCULAR | Status: AC
Start: 2024-05-24 — End: 2024-05-24
  Filled 2024-05-24: qty 2

## 2024-05-24 MED ORDER — ONDANSETRON HCL 4 MG PO TABS: 4.0000 mg | ORAL_TABLET | Freq: Four times a day (QID) | ORAL | Status: DC | PRN

## 2024-05-24 MED ORDER — PROPOFOL 500 MG/50ML IV EMUL
INTRAVENOUS | Status: DC | PRN
Start: 2024-05-24 — End: 2024-05-24
  Administered 2024-05-24: 40 ug/kg/min via INTRAVENOUS

## 2024-05-24 MED ORDER — METHOCARBAMOL 1000 MG/10ML IJ SOLN: 500.0000 mg | Freq: Four times a day (QID) | INTRAMUSCULAR | Status: DC | PRN

## 2024-05-24 MED ORDER — ISOPROPYL ALCOHOL 70 % SOLN
Status: DC | PRN
  Administered 2024-05-24: 1 via TOPICAL

## 2024-05-24 MED ORDER — ONDANSETRON HCL 4 MG/2ML IJ SOLN: 4.0000 mg | Freq: Four times a day (QID) | INTRAMUSCULAR | Status: DC | PRN

## 2024-05-24 MED ORDER — METHOCARBAMOL 500 MG PO TABS
500.0000 mg | ORAL_TABLET | Freq: Four times a day (QID) | ORAL | Status: DC | PRN
  Administered 2024-05-24 – 2024-05-25 (×3): 500 mg via ORAL
  Filled 2024-05-24 (×3): qty 1

## 2024-05-24 MED ORDER — METOCLOPRAMIDE HCL 5 MG PO TABS: 5.0000 mg | ORAL_TABLET | Freq: Three times a day (TID) | ORAL | Status: DC | PRN

## 2024-05-24 MED ORDER — MAGNESIUM CITRATE PO SOLN
1.0000 | Freq: Once | ORAL | Status: DC | PRN
Start: 2024-05-24 — End: 2024-05-25

## 2024-05-24 MED ORDER — VANCOMYCIN HCL 1 G IV SOLR
INTRAVENOUS | Status: DC | PRN
  Administered 2024-05-24: 1000 mg via TOPICAL

## 2024-05-24 MED ORDER — MENTHOL 3 MG MT LOZG: 1.0000 | LOZENGE | OROMUCOSAL | Status: DC | PRN

## 2024-05-24 MED ORDER — PROPOFOL 1000 MG/100ML IV EMUL
INTRAVENOUS | Status: AC
  Filled 2024-05-24: qty 100

## 2024-05-24 MED ORDER — DIPHENHYDRAMINE HCL 12.5 MG/5ML PO ELIX: 25.0000 mg | ORAL_SOLUTION | ORAL | Status: DC | PRN

## 2024-05-24 MED ORDER — EPHEDRINE SULFATE-NACL 50-0.9 MG/10ML-% IV SOSY
PREFILLED_SYRINGE | INTRAVENOUS | Status: DC | PRN
  Administered 2024-05-24 (×2): 5 mg via INTRAVENOUS

## 2024-05-24 MED ORDER — BUPIVACAINE-MELOXICAM ER 200-6 MG/7ML IJ SOLN
INTRAMUSCULAR | Status: DC | PRN
  Administered 2024-05-24: 200 mg

## 2024-05-24 MED ORDER — LACTATED RINGERS IV BOLUS
500.0000 mL | Freq: Once | INTRAVENOUS | Status: AC
  Administered 2024-05-24: 500 mL via INTRAVENOUS

## 2024-05-24 MED ORDER — DOCUSATE SODIUM 100 MG PO CAPS
100.0000 mg | ORAL_CAPSULE | Freq: Two times a day (BID) | ORAL | Status: DC
  Administered 2024-05-24 – 2024-05-25 (×2): 100 mg via ORAL
  Filled 2024-05-24 (×2): qty 1

## 2024-05-24 MED ORDER — ALUM & MAG HYDROXIDE-SIMETH 200-200-20 MG/5ML PO SUSP: 30.0000 mL | ORAL | Status: DC | PRN

## 2024-05-24 MED ORDER — FENTANYL CITRATE PF 50 MCG/ML IJ SOSY: 25.0000 ug | PREFILLED_SYRINGE | INTRAMUSCULAR | Status: DC | PRN

## 2024-05-24 MED ORDER — TRANEXAMIC ACID-NACL 1000-0.7 MG/100ML-% IV SOLN
1000.0000 mg | INTRAVENOUS | Status: AC
  Administered 2024-05-24: 1000 mg via INTRAVENOUS
  Filled 2024-05-24: qty 100

## 2024-05-24 SURGICAL SUPPLY — 42 items
BAG COUNTER SPONGE SURGICOUNT (BAG) IMPLANT
BAG ZIPLOCK 12X15 (MISCELLANEOUS) ×1 IMPLANT
BLADE SAG 18X100X1.27 (BLADE) ×1 IMPLANT
CLSR STERI-STRIP ANTIMIC 1/2X4 (GAUZE/BANDAGES/DRESSINGS) IMPLANT
COVER PERINEAL POST (MISCELLANEOUS) ×1 IMPLANT
COVER SURGICAL LIGHT HANDLE (MISCELLANEOUS) ×1 IMPLANT
DERMABOND ADVANCED .7 DNX12 (GAUZE/BANDAGES/DRESSINGS) IMPLANT
DRAPE IMP U-DRAPE 54X76 (DRAPES) ×1 IMPLANT
DRAPE POUCH INSTRU U-SHP 10X18 (DRAPES) ×1 IMPLANT
DRAPE STERI IOBAN 125X83 (DRAPES) ×1 IMPLANT
DRAPE TOP 10253 STERILE (DRAPES) ×2 IMPLANT
DRAPE U-SHAPE 47X51 STRL (DRAPES) ×1 IMPLANT
DRESSING AQUACEL AG SP 3.5X6 (GAUZE/BANDAGES/DRESSINGS) ×1 IMPLANT
DRSG AQUACEL AG ADV 3.5X10 (GAUZE/BANDAGES/DRESSINGS) ×1 IMPLANT
DURAPREP 26ML APPLICATOR (WOUND CARE) ×2 IMPLANT
ELECT PENCIL ROCKER SW 15FT (MISCELLANEOUS) ×1 IMPLANT
ELECT REM PT RETURN 15FT ADLT (MISCELLANEOUS) ×1 IMPLANT
GLOVE BIOGEL PI IND STRL 7.0 (GLOVE) ×1 IMPLANT
GLOVE BIOGEL PI IND STRL 7.5 (GLOVE) ×1 IMPLANT
GLOVE ECLIPSE 7.0 STRL STRAW (GLOVE) ×3 IMPLANT
GLOVE SURG SYN 7.5 PF PI (GLOVE) ×2 IMPLANT
GOWN SRG XL LVL 4 BRTHBL STRL (GOWNS) ×2 IMPLANT
HEAD CERAMIC 36 PLUS 8.5 12 14 (Hips) IMPLANT
HOOD PEEL AWAY T7 (MISCELLANEOUS) ×3 IMPLANT
KIT TURNOVER KIT A (KITS) ×1 IMPLANT
LINER NEUTRAL 52X36MM PLUS 4 (Liner) IMPLANT
MARKER SKIN DUAL TIP RULER LAB (MISCELLANEOUS) ×1 IMPLANT
NDL SPNL 18GX3.5 QUINCKE PK (NEEDLE) ×1 IMPLANT
NEEDLE SPNL 18GX3.5 QUINCKE PK (NEEDLE) ×1 IMPLANT
PACK ANTERIOR HIP CUSTOM (KITS) ×1 IMPLANT
PIN SECTOR W/GRIP ACE CUP 52MM (Hips) IMPLANT
SET HNDPC FAN SPRY TIP SCT (DISPOSABLE) ×1 IMPLANT
SOLUTION PRONTOSAN WOUND 350ML (IRRIGATION / IRRIGATOR) ×1 IMPLANT
STEM FEM ACTIS STD SZ4 (Stem) IMPLANT
SUT ETHIBOND 2 V 37 (SUTURE) ×1 IMPLANT
SUT MNCRL AB 3-0 PS2 18 (SUTURE) IMPLANT
SUT NYLON 3 0 (SUTURE) IMPLANT
SUT STRATAFIX PDS+ 0 24IN (SUTURE) ×1 IMPLANT
SUT VIC AB 0 CT1 36 (SUTURE) IMPLANT
SUT VIC AB 2-0 CT1 TAPERPNT 27 (SUTURE) ×2 IMPLANT
TRAY CATH INTERMITTENT SS 16FR (CATHETERS) IMPLANT
TUBE SUCTION HIGH CAP CLEAR NV (SUCTIONS) ×1 IMPLANT

## 2024-05-24 NOTE — Transfer of Care (Signed)
 Immediate Anesthesia Transfer of Care Note  Patient: Juan Lewis  Procedure(s) Performed: ARTHROPLASTY, HIP, TOTAL, ANTERIOR APPROACH (Left: Hip)  Patient Location: PACU  Anesthesia Type:MAC and Spinal  Level of Consciousness: drowsy and patient cooperative  Airway & Oxygen Therapy: Patient Spontanous Breathing and Patient connected to face mask oxygen  Post-op Assessment: Report given to RN and Post -op Vital signs reviewed and stable  Post vital signs: Reviewed and stable  Last Vitals:  Vitals Value Taken Time  BP 95/62 05/24/24 12:02  Temp    Pulse 57 05/24/24 12:04  Resp 9 05/24/24 12:04  SpO2 100 % 05/24/24 12:04  Vitals shown include unfiled device data.  Last Pain:  Vitals:   05/24/24 0804  TempSrc:   PainSc: 0-No pain         Complications: No notable events documented.

## 2024-05-24 NOTE — Discharge Instructions (Signed)

## 2024-05-24 NOTE — Anesthesia Preprocedure Evaluation (Signed)
 Anesthesia Evaluation  Patient identified by MRN, date of birth, ID band Patient awake    Reviewed: Allergy & Precautions, H&P , NPO status , Patient's Chart, lab work & pertinent test results  Airway Mallampati: II   Neck ROM: full    Dental   Pulmonary former smoker   breath sounds clear to auscultation       Cardiovascular negative cardio ROS  Rhythm:regular Rate:Normal     Neuro/Psych    GI/Hepatic ,GERD  ,,  Endo/Other    Renal/GU      Musculoskeletal  (+) Arthritis ,    Abdominal   Peds  Hematology   Anesthesia Other Findings   Reproductive/Obstetrics                              Anesthesia Physical Anesthesia Plan  ASA: 3  Anesthesia Plan: MAC and Spinal   Post-op Pain Management:    Induction: Intravenous  PONV Risk Score and Plan: 1 and Propofol  infusion, Ondansetron  and Treatment may vary due to age or medical condition  Airway Management Planned: Simple Face Mask  Additional Equipment:   Intra-op Plan:   Post-operative Plan:   Informed Consent: I have reviewed the patients History and Physical, chart, labs and discussed the procedure including the risks, benefits and alternatives for the proposed anesthesia with the patient or authorized representative who has indicated his/her understanding and acceptance.     Dental advisory given  Plan Discussed with: CRNA, Anesthesiologist and Surgeon  Anesthesia Plan Comments:         Anesthesia Quick Evaluation

## 2024-05-24 NOTE — Op Note (Signed)
 ARTHROPLASTY, HIP, TOTAL, ANTERIOR APPROACH  Procedure Note Percell Lamboy   [id number]  Pre-op Diagnosis: left hip avascular necrosis     Post-op Diagnosis: same  Operative Findings AVN and DJD   Operative Procedures  1. Total hip replacement; Left hip; uncemented [id number]   Surgeon: Kay Cummins, M.D.  Assist: April Green   Anesthesia: spinal  Prosthesis: Depuy Acetabulum: Pinnacle 52 mm Femur: ACtis 4 STD Head: 36 mm size: +8.5 Liner: +4 neutral Bearing Type: ceramic/poly  Total Hip Arthroplasty (Anterior Approach) Op Note:  After informed consent was obtained and the operative extremity marked in the holding area, the patient was brought back to the operating room and placed supine on the HANA table. Next, the operative extremity was prepped and draped in normal sterile fashion. Surgical timeout occurred verifying patient identification, surgical site, surgical procedure and administration of antibiotics.  A 10 cm longitudinal incision was made starting from 2 fingerbreadths lateral and inferior to the ASIS towards the lateral aspect of the patella.  A Hueter approach to the hip was performed, using the interval between tensor fascia lata and sartorius.  Dissection was carried bluntly down onto the anterior hip capsule. The lateral femoral circumflex vessels were identified and coagulated. A capsulotomy was performed and the capsular flaps tagged for later repair.  The neck osteotomy was performed 1 fingerbreadth above the lesser trochanter. The femoral head was removed which showed AVN and advanced DJD, the acetabular rim was cleared of soft tissue and osteophytes and attention was turned to reaming the acetabulum.  Sequential reaming was performed under fluoroscopic guidance down to the floor of the cotyloid fossa. We reamed to a size 51 mm, and then impacted the acetabular shell.   A +4 neutral liner was then placed after irrigation and attention turned to the femur.  After  placing the femoral hook, the leg was taken to externally rotated, extended and adducted position taking care to perform soft tissue releases to allow for adequate mobilization of the femur. Soft tissue was cleared from the shoulder of the greater trochanter and the hook elevator used to improve exposure of the proximal femur.  Lateral bone from the shoulder was rasped away for relief.  Sequential broaching performed up to a size 4.  Standard trial neck and +8.5 head were placed. The leg was brought back up to neutral and the construct reduced.  The position and sizing of components, offset and leg lengths were checked using fluoroscopy. Stability of the construct was checked in 45 degrees of hip extension and 90 degrees of external rotation without any subluxation, shuck or impingement of prosthesis. We dislocated the prosthesis, dropped the leg back into position, removed trial components, and irrigated copiously. The final stem and head was then placed, the leg brought back up, the system reduced and fluoroscopy used to verify positioning.  Antibiotic irrigation was placed in the surgical wound.   We irrigated, obtained hemostasis and closed the capsule using #2 ethibond suture.  A topical mixture of 0.25% bupivacaine  and meloxicam  was placed deep to the fascia.  One gram of vancomycin  powder was placed in the surgical bed.   One gram of topical tranexamic acid  was injected into the joint.  The fascia was closed with #1 stratafix, the deep fat layer was closed with 0 vicryl, the subcutaneous layers closed with 2.0 Vicryl Plus and the skin closed with 2.0 nylon and dermabond. A sterile dressing was applied. The patient was awakened in the operating room and taken to recovery  in stable condition.  All sponge, needle, and instrument counts were correct at the end of the case.   Morna Grave, my PA, was a medical necessity for opening, closing, limb positioning, retracting, exposing, and overall facilitation  and timely completion of the surgery.  Position: supine  Complications: see description of procedure.  Time Out: performed   Drains/Packing: none  Estimated blood loss: see anesthesia record  Returned to Recovery Room: in good condition.   Antibiotics: yes   Mechanical VTE (DVT) Prophylaxis: sequential compression devices, TED thigh-high  Chemical VTE (DVT) Prophylaxis: aspirin    Fluid Replacement: see anesthesia record  Specimens Removed: 1 to pathology   Sponge and Instrument Count Correct? yes   PACU: portable radiograph - low AP   Plan/RTC: Return in 2 weeks for suture removal. Weight Bearing/Load Lower Extremity: full  Hip precautions: none Suture Removal: 2 weeks   N. Ozell Cummins, MD [facility] 11:24 AM   Implant Name Type Inv. Item Serial No. Manufacturer Lot No. LRB No. Used Action  PIN SECTOR W/GRIP ACE CUP - [id number] Hips PIN SECTOR W/GRIP ACE CUP  [employer] [id number] Left 1 Implanted  LINER NEUTRAL [id number] PLUS 4 - [id number] Liner LINER NEUTRAL [id number] PLUS 4  [employer] [id number] Left 1 Implanted  STEM FEM ACTIS STD SZ4 - [id number] Stem STEM FEM ACTIS STD SZ4  [employer] [id number] Left 1 Implanted  HEAD CERAMIC 36 PLUS 8.5 12 14  - [id number] Hips HEAD CERAMIC 36 PLUS 8.5 12 14   [employer] [id number] Left 1 Implanted

## 2024-05-24 NOTE — Anesthesia Procedure Notes (Signed)
 Procedure Name: MAC Date/Time: 05/24/2024 10:02 AM  Performed by: Memory Armida LABOR, CRNAPre-anesthesia Checklist: Patient identified, Emergency Drugs available, Suction available, Patient being monitored and Timeout performed Patient Re-evaluated:Patient Re-evaluated prior to induction Oxygen Delivery Method: Simple face mask Placement Confirmation: positive ETCO2 Dental Injury: Teeth and Oropharynx as per pre-operative assessment

## 2024-05-24 NOTE — Plan of Care (Signed)

## 2024-05-24 NOTE — H&P (Signed)
 PREOPERATIVE H&P  Chief Complaint: left hip avascular necrosis  HPI: Juan Lewis is a 53 y.o. male who presents for surgical treatment of left hip avascular necrosis.  He denies any changes in medical history.  Past Surgical History:  Procedure Laterality Date   COLONOSCOPY     ESOPHAGOGASTRODUODENOSCOPY ENDOSCOPY     MICROLARYNGOSCOPY N/A 03/23/2019   Procedure: MICROLARYNGOSCOPY WITH EXCISION OF VOCAL CORD LESION;  Surgeon: Ethyl Lonni BRAVO, MD;  Location: [facility];  Service: ENT;  Laterality: N/A;   MICROLARYNGOSCOPY N/A 07/15/2020   Procedure: DIAGNOSTIC LARYNGOSCOPY WITH MICROSCOPE AND BIOPSY;  Surgeon: Ethyl Lonni BRAVO, MD;  Location: [facility];  Service: ENT;  Laterality: N/A;   MICROLARYNGOSCOPY WITH CO2 LASER AND EXCISION OF VOCAL CORD LESION N/A 07/28/2020   Procedure: MICROLARYNGOSCOPY WITH CO2 LASER AND EXCISION OF VOCAL CORD LESION;  Surgeon: Ethyl Lonni BRAVO, MD;  Location: [facility] OR;  Service: ENT;  Laterality: N/A;   PROSTATE BIOPSY     Social History   Socioeconomic History   Marital status: Married    Spouse name: [name]    Number of children: 4   Years of education: Not on file   Highest education level: Not on file  Occupational History   Not on file  Tobacco Use   Smoking status: Former    Current packs/day: 0.00    Average packs/day: 0.5 packs/day for 30.5 years (15.3 ttl pk-yrs)    Types: Cigarettes    Start date: 43    Quit date: 04/20/2019    Years since quitting: 5.0   Smokeless tobacco: Never   Tobacco comments:    he quit when he started radiation. 06/15/19  Vaping Use   Vaping status: Never Used  Substance and Sexual Activity   Alcohol  use: Not Currently    Alcohol /week: 1.0 - 8.0 standard drink of alcohol     Types: 1 - 8 Glasses of wine per week    Comment: wine on weekend 1-2 botles   Drug use: No   Sexual activity: Not Currently  Other Topics Concern   Not on file  Social History  Narrative   Not on file   Social Drivers of Health   Financial Resource Strain: Not on file  Food Insecurity: Low Risk  (10/26/2023)   Received from [facility]   Hunger Vital Sign    Within the past 12 months, you worried that your food would run out before you got money to buy more: Never true    Within the past 12 months, the food you bought just didn't last and you didn't have money to get more. : Never true  Transportation Needs: No Transportation Needs (10/26/2023)   Received from [employer]    In the past 12 months, has lack of reliable transportation kept you from medical appointments, meetings, work or from getting things needed for daily living? : No  Physical Activity: Not on file  Stress: Not on file  Social Connections: Not on file   Family History  Problem Relation Age of Onset   Prostate cancer Neg Hx    Breast cancer Neg Hx    Colon cancer Neg Hx    Pancreatic cancer Neg Hx    Colon polyps Neg Hx    Esophageal cancer Neg Hx    Rectal cancer Neg Hx    Stomach cancer Neg Hx    No Known Allergies Prior to Admission medications   Medication Sig Start Date  End Date Taking? Authorizing Provider  artificial tears (LACRILUBE) OINT ophthalmic ointment Place into the left eye at bedtime as needed for dry eyes. Please place the ophthalmic ointment in your eye and then tape your eyelid closed at night. Patient not taking: Reported on 05/11/2024 12/31/21   Tegeler, Lonni PARAS, MD  aspirin  (ASPIRIN  81) 81 MG chewable tablet Chew 1 tablet (81 mg total) by mouth 2 (two) times daily. To be taken after surgery to prevent blood clots 05/07/24   Jule Ronal CROME, PA-C  docusate sodium  (COLACE) 100 MG capsule Take 1 capsule (100 mg total) by mouth daily as needed. 05/07/24 05/07/25  Jule Ronal CROME, PA-C  loratadine  (CLARITIN ) 10 MG tablet Take 1 tablet (10 mg total) by mouth daily as needed for allergies or rhinitis. Patient not taking: Reported on 05/11/2024 06/17/22  07/17/22  Ranae Ellouise LABOR, NP  meloxicam  (MOBIC ) 15 MG tablet TAKE 1 TABLET(15 MG) BY MOUTH DAILY Patient not taking: Reported on 05/11/2024 11/15/23   Persons, Ronal Dragon, PA  meloxicam  (MOBIC ) 15 MG tablet TAKE 1 TABLET(15 MG) BY MOUTH DAILY Patient not taking: Reported on 05/11/2024 12/29/23   Persons, Ronal Dragon, PA  methocarbamol  (ROBAXIN ) 750 MG tablet Take 1 tablet (750 mg total) by mouth 3 (three) times daily as needed. 05/07/24   Jule Ronal CROME, PA-C  ondansetron  (ZOFRAN ) 4 MG tablet Take 1 tablet (4 mg total) by mouth every 8 (eight) hours as needed for nausea or vomiting. 05/07/24   Jule Ronal CROME, PA-C  oxyCODONE -acetaminophen  (PERCOCET) 5-325 MG tablet Take 1-2 tablets by mouth every 6 (six) hours as needed. To be taken after surgery 05/07/24   Jule Ronal CROME, PA-C     Positive ROS: All other systems have been reviewed and were otherwise negative with the exception of those mentioned in the HPI and as above.  Physical Exam: General: Alert, no acute distress Cardiovascular: No pedal edema Respiratory: No cyanosis, no use of accessory musculature GI: abdomen soft Skin: No lesions in the area of chief complaint Neurologic: Sensation intact distally Psychiatric: Patient is competent for consent with normal mood and affect Lymphatic: no lymphedema  MUSCULOSKELETAL: exam stable  Assessment: left hip avascular necrosis  Plan: Plan for Procedure(s): ARTHROPLASTY, HIP, TOTAL, ANTERIOR APPROACH  The risks benefits and alternatives were discussed with the patient including but not limited to the risks of nonoperative treatment, versus surgical intervention including infection, bleeding, nerve injury,  blood clots, cardiopulmonary complications, morbidity, mortality, among others, and they were willing to proceed.   Ozell Cummins, MD 05/24/2024 9:08 AM

## 2024-05-24 NOTE — Evaluation (Signed)
 Physical Therapy Evaluation Patient Details Name: Juan Lewis MRN: [id number] DOB: 18-Jul-1971 Today's Date: 05/24/2024  History of Present Illness  Pt s/p L THR and with hx of prostate CA  Clinical Impression  Pt s/p L THR and presents with decreased L LE strength/ROM and post op pain limiting functional mobility.  PT to room this pm when alerted by RN pt ready for assessment.  Pt able to move feet and squeeze buttocks and indicates he can feel everything.  Pt moving well in bed and able to balance in sitting EOB but with standing and attempt to take steps, noted instability in L LE and pt returned to bed for safety.   Pt should progress to dc home with family assist.      If plan is discharge home, recommend the following: A little help with walking and/or transfers;A little help with bathing/dressing/bathroom;Assistance with cooking/housework;Assist for transportation;Help with stairs or ramp for entrance   Can travel by private vehicle        Equipment Recommendations None recommended by PT  Recommendations for Other Services       Functional Status Assessment Patient has had a recent decline in their functional status and demonstrates the ability to make significant improvements in function in a reasonable and predictable amount of time.     Precautions / Restrictions Precautions Precautions: Fall Restrictions Weight Bearing Restrictions Per Provider Order: No Other Position/Activity Restrictions: WBAT      Mobility  Bed Mobility Overal bed mobility: Needs Assistance Bed Mobility: Supine to Sit, Sit to Supine     Supine to sit: Min assist Sit to supine: Min assist, Mod assist   General bed mobility comments: Increased time with cues for sequence and use of R LE to self assist    Transfers Overall transfer level: Needs assistance Equipment used: Rolling walker (2 wheels) Transfers: Sit to/from Stand Sit to Stand: Min assist, Mod assist           General  transfer comment: cues for LE management and use of UEs to self assist    Ambulation/Gait Ambulation/Gait assistance: Min assist, Mod assist Gait Distance (Feet): 4 Feet Assistive device: Rolling walker (2 wheels) Gait Pattern/deviations: Step-to pattern, Decreased step length - right, Decreased step length - left, Shuffle, Trunk flexed       General Gait Details: cues for sequence, posture and position from RW; NOted instability with L LE WB and pt returned to bed for safety  Stairs            Wheelchair Mobility     Tilt Bed    Modified Rankin (Stroke Patients Only)       Balance Overall balance assessment: Needs assistance Sitting-balance support: No upper extremity supported, Feet supported Sitting balance-Leahy Scale: Good     Standing balance support: Bilateral upper extremity supported Standing balance-Leahy Scale: Poor                               Pertinent Vitals/Pain Pain Assessment Pain Assessment: Faces Faces Pain Scale: Hurts a little bit Pain Location: L hip Pain Descriptors / Indicators: Sore Pain Intervention(s): Monitored during session, Limited activity within patient's tolerance, Ice applied    Home Living Family/patient expects to be discharged to:: Private residence Living Arrangements: Spouse/significant other;Children Available Help at Discharge: Family Type of Home: Apartment Home Access: Stairs to enter Entrance Stairs-Rails: Right;Left;Can reach both Entrance Stairs-Number of Steps: 14     Home  Equipment: Agricultural consultant (2 wheels)      Prior Function Prior Level of Function : Independent/Modified Independent                     Extremity/Trunk Assessment   Upper Extremity Assessment Upper Extremity Assessment: Overall WFL for tasks assessed    Lower Extremity Assessment Lower Extremity Assessment: LLE deficits/detail LLE Deficits / Details: 2+/5 at hip with AAROM at hip to 80 flex    Cervical /  Trunk Assessment Cervical / Trunk Assessment: Normal  Communication   Communication Communication: No apparent difficulties Factors Affecting Communication: Reduced clarity of speech    Cognition Arousal: Alert Behavior During Therapy: WFL for tasks assessed/performed   PT - Cognitive impairments: No apparent impairments, Difficult to assess Difficult to assess due to:  (Limited English)                       Following commands: Intact       Cueing Cueing Techniques: Verbal cues, Gestural cues, Tactile cues     General Comments      Exercises Total Joint Exercises Ankle Circles/Pumps: AROM, Both, 15 reps, Supine Quad Sets: AROM, Both, 10 reps, Supine Heel Slides: AAROM, Left, 20 reps, Supine   Assessment/Plan    PT Assessment    PT Problem List         PT Treatment Interventions      PT Goals (Current goals can be found in the Care Plan section)  Acute Rehab PT Goals Patient Stated Goal: Regain IND PT Goal Formulation: With patient Time For Goal Achievement: 05/31/24 Potential to Achieve Goals: Good    Frequency       Co-evaluation               AM-PAC PT 6 Clicks Mobility  Outcome Measure Help needed turning from your back to your side while in a flat bed without using bedrails?: A Little Help needed moving from lying on your back to sitting on the side of a flat bed without using bedrails?: A Little Help needed moving to and from a bed to a chair (including a wheelchair)?: A Little Help needed standing up from a chair using your arms (e.g., wheelchair or bedside chair)?: A Lot Help needed to walk in hospital room?: Total Help needed climbing 3-5 steps with a railing? : Total 6 Click Score: 13    End of Session Equipment Utilized During Treatment: Gait belt Activity Tolerance: Patient tolerated treatment well Patient left: in bed;with call bell/phone within reach;with family/visitor present        Time: 8564-8496 PT Time  Calculation (min) (ACUTE ONLY): 28 min   Charges:   PT Evaluation $PT Eval Low Complexity: 1 Low PT Treatments $Therapeutic Activity: 8-22 mins PT General Charges $$ ACUTE PT VISIT: 1 Visit         [facility] PT Acute Rehabilitation Services Office [phone]   [facility] 05/24/2024, 3:13 PM

## 2024-05-24 NOTE — Anesthesia Procedure Notes (Signed)
 Spinal  Patient location during procedure: OR Start time: 05/24/2024 10:03 AM End time: 05/24/2024 10:12 AM Reason for block: surgical anesthesia Staffing Performed: anesthesiologist  Anesthesiologist: Maryclare Cornet, MD Performed by: Maryclare Cornet, MD Authorized by: Maryclare Cornet, MD   Preanesthetic Checklist Completed: patient identified, IV checked, risks and benefits discussed, surgical consent, monitors and equipment checked, pre-op evaluation and timeout performed Spinal Block Patient position: sitting Prep: DuraPrep Patient monitoring: cardiac monitor, continuous pulse ox and blood pressure Approach: midline Location: L3-4 Injection technique: single-shot Needle Needle type: Pencan  Needle gauge: 24 G Needle length: 9 cm Assessment Sensory level: T10 Events: CSF return and second provider Additional Notes Functioning IV was confirmed and monitors were applied. Sterile prep and drape, including hand hygiene and sterile gloves were used. The patient was positioned and the spine was prepped. The skin was anesthetized with lidocaine .  Free flow of clear CSF was obtained prior to injecting local anesthetic into the CSF.  The spinal needle aspirated freely following injection.  The needle was carefully withdrawn.  The patient tolerated the procedure well.

## 2024-05-25 ENCOUNTER — Encounter (HOSPITAL_COMMUNITY): Payer: Self-pay | Admitting: Orthopaedic Surgery

## 2024-05-25 DIAGNOSIS — M87052 Idiopathic aseptic necrosis of left femur: Secondary | ICD-10-CM | POA: Diagnosis not present

## 2024-05-25 NOTE — Plan of Care (Signed)

## 2024-05-25 NOTE — Progress Notes (Signed)
 Physical Therapy Treatment Patient Details Name: Juan Lewis MRN: [id number] DOB: [birth year] Today's Date: 05/25/2024   History of Present Illness Pt s/p L THR and with hx of prostate CA    PT Comments  POD # 1 pm session Assisted with amb in hallway a greater distance then practiced stairs. General stair comments: practiced 12 steps with B hands on LEFT rail and VC's on proper sequencing.  Pt was able to safely navigate 12 steps up and down. Then returned to room to perform some TE's following HEP handout.  Instructed on proper tech, freq as well as use of ICE.   Addressed all mobility questions, discussed appropriate activity, educated on use of ICE.  Pt ready for D/C to home.    If plan is discharge home, recommend the following: A little help with walking and/or transfers;A little help with bathing/dressing/bathroom;Assistance with cooking/housework;Assist for transportation;Help with stairs or ramp for entrance   Can travel by private vehicle        Equipment Recommendations  None recommended by PT    Recommendations for Other Services       Precautions / Restrictions Precautions Precautions: Fall Restrictions Weight Bearing Restrictions Per Provider Order: No LLE Weight Bearing Per Provider Order: Weight bearing as tolerated     Mobility  Bed Mobility Overal bed mobility: Needs Assistance Bed Mobility: Supine to Sit     Supine to sit: Supervision, Contact guard     General bed mobility comments: OOB in recliner    Transfers Overall transfer level: Needs assistance Equipment used: Rolling walker (2 wheels) Transfers: Sit to/from Stand Sit to Stand: Supervision           General transfer comment: cues for LE management and use of UEs to self assist    Ambulation/Gait Ambulation/Gait assistance: Supervision Gait Distance (Feet): 85 Feet Assistive device: Rolling walker (2 wheels) Gait Pattern/deviations: Step-to pattern, Decreased step length -  right, Decreased step length - left, Shuffle, Trunk flexed Gait velocity: decreased     General Gait Details: tolerated an increased distance with VC's on proper walker to self distance as well as safety with turns.   Stairs Stairs: Yes Stairs assistance: Contact guard assist, Supervision Stair Management: One rail Left, Step to pattern, Forwards, Sideways Number of Stairs: 12 General stair comments: practiced 12 steps with B hands on LEFT rail and VC's on proper sequencing.  Pt was able to safely navigate 12 steps up and down.   Wheelchair Mobility     Tilt Bed    Modified Rankin (Stroke Patients Only)       Balance                                            Communication Communication Communication: No apparent difficulties  Cognition Arousal: Alert Behavior During Therapy: WFL for tasks assessed/performed   PT - Cognitive impairments: No apparent impairments, Difficult to assess                       PT - Cognition Comments: AxO x 3 pleasant and motivated [place] Speaking but knows some [place]. Following commands: Intact      Cueing Cueing Techniques: Verbal cues  Exercises  Total Hip Replacement TE's following HEP Handout 10 reps ankle pumps 05 reps knee presses 05 reps heel slides 05 reps SAQ's 05 reps ABD Instructed how to use  a belt loop to assist  Followed by ICE     General Comments        Pertinent Vitals/Pain Pain Assessment Pain Assessment: Faces Faces Pain Scale: Hurts little more Pain Location: L hip Pain Descriptors / Indicators: Sore, Tender, Operative site guarding Pain Intervention(s): Monitored during session, Premedicated before session, Repositioned, Ice applied    Home Living                          Prior Function            PT Goals (current goals can now be found in the care plan section) Progress towards PT goals: Progressing toward goals    Frequency           PT Plan       Co-evaluation              AM-PAC PT 6 Clicks Mobility   Outcome Measure  Help needed turning from your back to your side while in a flat bed without using bedrails?: A Little Help needed moving from lying on your back to sitting on the side of a flat bed without using bedrails?: A Little Help needed moving to and from a bed to a chair (including a wheelchair)?: A Little Help needed standing up from a chair using your arms (e.g., wheelchair or bedside chair)?: A Little Help needed to walk in hospital room?: A Little Help needed climbing 3-5 steps with a railing? : A Little 6 Click Score: 18    End of Session Equipment Utilized During Treatment: Gait belt Activity Tolerance: Patient tolerated treatment well Patient left: in chair;with call bell/phone within reach;with chair alarm set Nurse Communication: Mobility status PT Visit Diagnosis: Difficulty in walking, not elsewhere classified (R26.2)     Time: 8697-8673 PT Time Calculation (min) (ACUTE ONLY): 24 min  Charges:    $Gait Training: 8-22 mins $Therapeutic Exercise: 8-22 mins PT General Charges $$ ACUTE PT VISIT: 1 Visit

## 2024-05-25 NOTE — Progress Notes (Signed)
   Subjective:  Patient reports pain as mild.   Today's  total administered Morphine  Milligram Equivalents: 10  Objective:   VITALS:   Vitals:   05/24/24 1834 05/24/24 2138 05/25/24 0242 05/25/24 0632  BP: (!) 140/78 130/88 128/85 136/84  Pulse: 61 68 62 (!) 58  Resp: 16 18 18 18   Temp: 97.9 F (36.6 C) 98.3 F (36.8 C) 98.4 F (36.9 C) 98.1 F (36.7 C)  TempSrc: Oral Oral Oral Oral  SpO2: 100% 96% 96% 98%  Weight:      Height:        Neurovascular intact Sensation intact distally Intact pulses distally Dorsiflexion/Plantar flexion intact Incision: dressing C/D/I and no drainage   Lab Results  Component Value Date   WBC 5.1 05/17/2024   HGB 13.0 05/17/2024   HCT 40.9 05/17/2024   MCV 91.7 05/17/2024   PLT 276 05/17/2024     Assessment/Plan:  1 Day Post-Op   - Expected postop acute blood loss anemia - Up with PT/OT - DVT ppx - SCDs, ambulation, aspirin  - WBAT operative extremity - Pain control - Discharge planning - home today after PT  Juan Lewis 05/25/2024, 6:59 AM

## 2024-05-25 NOTE — TOC Transition Note (Signed)
 Transition of Care [facility]) - Discharge Note   Patient Details  Name: Juan Lewis MRN: [id number] Date of Birth: [birth year]  Transition of [facility] CM/SW Contact:  Alfonse JONELLE Rex, RN Phone Number: 05/25/2024, 10:05 AM   Clinical Narrative:   Met with patient at bedside to review dc therapy and home equipment needs, pt confirmed HH PT ([facility]), has RW, no home DME needs. No TOC Needs.      Final next level of care: Home w Home Health Services Barriers to Discharge: No Barriers Identified   Patient Goals and CMS Choice Patient states their goals for this hospitalization and ongoing recovery are:: return home          Discharge Placement                       Discharge Plan and Services Additional resources added to the After Visit Summary for                                       Social Drivers of Health (SDOH) Interventions SDOH Screenings   Food Insecurity: No Food Insecurity (05/24/2024)  Housing: Low Risk  (05/24/2024)  Transportation Needs: No Transportation Needs (05/24/2024)  Utilities: Not At Risk (05/24/2024)  Alcohol  Screen: Low Risk  (07/08/2023)  Depression (PHQ2-9): Low Risk  (03/26/2021)  Tobacco Use: Medium Risk (05/24/2024)  Health Literacy: Adequate Health Literacy (07/08/2023)     Readmission Risk Interventions     No data to display

## 2024-05-25 NOTE — Discharge Summary (Signed)
 Patient ID: Henok Heacock MRN: [id number] DOB/AGE: 1971/03/29 53 y.o.  Admit date: 05/24/2024 Discharge date: 05/25/2024  Admission Diagnoses:  Avascular necrosis of bone of hip, left [facility])  Discharge Diagnoses:  Principal Problem:   Avascular necrosis of bone of hip, left (HCC) Active Problems:   Status post total replacement of left hip   Past Medical History:  Diagnosis Date   Arthritis    GERD (gastroesophageal reflux disease)    hx of   History of radiation therapy    2020/2021 for vocal cord lesion and prostate cancer   Hoarseness 2020   Lesion of vocal cord 2020   Pre-diabetes    Prostate cancer (HCC) 2021    Surgeries: Procedure(s): ARTHROPLASTY, HIP, TOTAL, ANTERIOR APPROACH on 05/24/2024   Consultants (if any):   Discharged Condition: Improved  Hospital Course: Danzig Macgregor is an 53 y.o. male who was admitted 05/24/2024 with a diagnosis of Avascular necrosis of bone of hip, left (HCC) and went to the operating room on 05/24/2024 and underwent the above named procedures.    He was given perioperative antibiotics:  Anti-infectives (From admission, onward)    Start     Dose/Rate Route Frequency Ordered Stop   05/24/24 1600  ceFAZolin  (ANCEF ) IVPB 2g/100 mL premix        2 g 200 mL/hr over 30 Minutes Intravenous Every 6 hours 05/24/24 1535 05/24/24 2204   05/24/24 1041  vancomycin  (VANCOCIN ) powder  Status:  Discontinued          As needed 05/24/24 1041 05/24/24 1159   05/24/24 0815  ceFAZolin  (ANCEF ) IVPB 2g/100 mL premix        2 g 200 mL/hr over 30 Minutes Intravenous On call to O.R. 05/24/24 0800 05/24/24 1044     .  He was given sequential compression devices, early ambulation, and appropriate chemoprophylaxis for DVT prophylaxis.  He benefited maximally from the hospital stay and there were no complications.    Recent vital signs:  Vitals:   05/25/24 0242 05/25/24 0632  BP: 128/85 136/84  Pulse: 62 (!) 58  Resp: 18 18  Temp: 98.4 F  (36.9 C) 98.1 F (36.7 C)  SpO2: 96% 98%    Recent laboratory studies:  Lab Results  Component Value Date   HGB 13.0 05/17/2024   HGB 12.2 (L) 07/15/2022   HGB 13.2 02/23/2022   Lab Results  Component Value Date   WBC 5.1 05/17/2024   PLT 276 05/17/2024   Lab Results  Component Value Date   INR 1.0 12/31/2021   Lab Results  Component Value Date   NA 140 05/17/2024   K 4.2 05/17/2024   CL 106 05/17/2024   CO2 26 05/17/2024   BUN 10 05/17/2024   CREATININE 0.95 05/17/2024   GLUCOSE 90 05/17/2024    Discharge Medications:   Allergies as of 05/25/2024   No Known Allergies      Medication List     TAKE these medications    artificial tears Oint ophthalmic ointment Commonly known as: LACRILUBE Place into the left eye at bedtime as needed for dry eyes. Please place the ophthalmic ointment in your eye and then tape your eyelid closed at night.   aspirin  81 MG chewable tablet Commonly known as: Aspirin  81 Chew 1 tablet (81 mg total) by mouth 2 (two) times daily. To be taken after surgery to prevent blood clots   docusate sodium  100 MG capsule Commonly known as: Colace Take 1 capsule (100 mg total) by  mouth daily as needed.   loratadine  10 MG tablet Commonly known as: CLARITIN  Take 1 tablet (10 mg total) by mouth daily as needed for allergies or rhinitis.   meloxicam  15 MG tablet Commonly known as: MOBIC  TAKE 1 TABLET(15 MG) BY MOUTH DAILY   meloxicam  15 MG tablet Commonly known as: MOBIC  TAKE 1 TABLET(15 MG) BY MOUTH DAILY   methocarbamol  750 MG tablet Commonly known as: ROBAXIN  Take 1 tablet (750 mg total) by mouth 3 (three) times daily as needed.   ondansetron  4 MG tablet Commonly known as: Zofran  Take 1 tablet (4 mg total) by mouth every 8 (eight) hours as needed for nausea or vomiting.   oxyCODONE -acetaminophen  5-325 MG tablet Commonly known as: Percocet Take 1-2 tablets by mouth every 6 (six) hours as needed. To be taken after surgery                Durable Medical Equipment  (From admission, onward)           Start     Ordered   05/24/24 1536  DME Walker rolling  Once       Question:  Patient needs a walker to treat with the following condition  Answer:  History of hip replacement   05/24/24 1535   05/24/24 1536  DME 3 n 1  Once        05/24/24 1535   05/24/24 1536  DME Bedside commode  Once       Question:  Patient needs a bedside commode to treat with the following condition  Answer:  History of hip replacement   05/24/24 1535            Diagnostic Studies: DG Pelvis Portable Result Date: 05/24/2024 CLINICAL DATA:  Post left hip replacement. EXAM: PORTABLE PELVIS 1-2 VIEWS COMPARISON:  04/12/2024 FINDINGS: Evidence of patient's recent left total hip arthroplasty which is intact and normally located. Minimal stable osteoarthritic change of the right hip. Fiducial markers over the prostate. Remainder of the exam is unchanged. IMPRESSION: Expected changes post left total hip arthroplasty. Electronically Signed   By: Toribio Agreste M.D.   On: 05/24/2024 13:48   DG HIP UNILAT WITH PELVIS 1V LEFT Result Date: 05/24/2024 CLINICAL DATA:  Elective surgery. EXAM: DG HIP (WITH OR WITHOUT PELVIS) 1V*L* COMPARISON:  None Available. FINDINGS: Two fluoroscopic spot views of the pelvis and left hip obtained in the operating room. Images during hip arthroplasty. Fluoroscopy time 23.9 seconds. Dose 2.43 mGy. IMPRESSION: Intraoperative fluoroscopy during left hip arthroplasty. Electronically Signed   By: Andrea Gasman M.D.   On: 05/24/2024 13:15   DG C-Arm 1-60 Min-No Report Result Date: 05/24/2024 Fluoroscopy was utilized by the requesting physician.  No radiographic interpretation.   DG C-Arm 1-60 Min-No Report Result Date: 05/24/2024 Fluoroscopy was utilized by the requesting physician.  No radiographic interpretation.    Disposition: Discharge disposition: 01-Home or Self Care       Discharge Instructions      Call MD / Call 911   Complete by: As directed    If you experience chest pain or shortness of breath, CALL 911 and be transported to the hospital emergency room.  If you develope a fever above 101.5 F, pus (white drainage) or increased drainage or redness at the wound, or calf pain, call your surgeon's office.   Constipation Prevention   Complete by: As directed    Drink plenty of fluids.  Prune juice may be helpful.  You may use a stool softener,  such as Colace (over the counter) 100 mg twice a day.  Use MiraLax  (over the counter) for constipation as needed.   Driving restrictions   Complete by: As directed    No driving while taking narcotic pain meds.   Increase activity slowly as tolerated   Complete by: As directed    Post-operative opioid taper instructions:   Complete by: As directed    POST-OPERATIVE OPIOID TAPER INSTRUCTIONS: It is important to wean off of your opioid medication as soon as possible. If you do not need pain medication after your surgery it is ok to stop day one. Opioids include: Codeine, Hydrocodone (Norco, Vicodin), Oxycodone (Percocet, oxycontin ) and hydromorphone  amongst others.  Long term and even short term use of opiods can cause: Increased pain response Dependence Constipation Depression Respiratory depression And more.  Withdrawal symptoms can include Flu like symptoms Nausea, vomiting And more Techniques to manage these symptoms Hydrate well Eat regular healthy meals Stay active Use relaxation techniques(deep breathing, meditating, yoga) Do Not substitute Alcohol  to help with tapering If you have been on opioids for less than two weeks and do not have pain than it is ok to stop all together.  Plan to wean off of opioids This plan should start within one week post op of your joint replacement. Maintain the same interval or time between taking each dose and first decrease the dose.  Cut the total daily intake of opioids by one tablet each day Next  start to increase the time between doses. The last dose that should be eliminated is the evening dose.             Signed: Ozell Cummins 05/25/2024, 7:01 AM

## 2024-05-25 NOTE — Progress Notes (Signed)
 AVS reviewed w/ pt  who verbalized an understanding. PIV removed as noted. Pt dressed for d/c to home. No other questions at this time

## 2024-05-25 NOTE — Progress Notes (Signed)
 Physical Therapy Treatment Patient Details Name: Juan Lewis MRN: [id number] DOB: 1971-03-18 Today's Date: 05/25/2024   History of Present Illness Pt s/p L THR and with hx of prostate CA    PT Comments  POD # 1 am session AxO x 3 pleasant and motivated [place] Speaking but knows some [place]. Assisted OOB to amb in hallway with increased distance.  Then returned to room to perform some TE's following HEP handout.  Instructed on proper tech, freq as well as use of ICE.   Will see Pt again this afternoon to practice stairs as Pt lived on second floor apartment.   If plan is discharge home, recommend the following: A little help with walking and/or transfers;A little help with bathing/dressing/bathroom;Assistance with cooking/housework;Assist for transportation;Help with stairs or ramp for entrance   Can travel by private vehicle        Equipment Recommendations  None recommended by PT    Recommendations for Other Services       Precautions / Restrictions Precautions Precautions: Fall Restrictions Weight Bearing Restrictions Per Provider Order: No LLE Weight Bearing Per Provider Order: Weight bearing as tolerated     Mobility  Bed Mobility Overal bed mobility: Needs Assistance Bed Mobility: Supine to Sit     Supine to sit: Supervision, Contact guard     General bed mobility comments: demonstarted and instructed how to use a belt to self assist LE    Transfers Overall transfer level: Needs assistance Equipment used: Rolling walker (2 wheels) Transfers: Sit to/from Stand Sit to Stand: Supervision, Contact guard assist           General transfer comment: cues for LE management and use of UEs to self assist    Ambulation/Gait Ambulation/Gait assistance: Supervision, Contact guard assist Gait Distance (Feet): 65 Feet Assistive device: Rolling walker (2 wheels) Gait Pattern/deviations: Step-to pattern, Decreased step length - right, Decreased step length - left,  Shuffle, Trunk flexed Gait velocity: decreased     General Gait Details: tolerated an increased distance with VC's on proper walker to self distance as well as safety with turns.   Stairs             Wheelchair Mobility     Tilt Bed    Modified Rankin (Stroke Patients Only)       Balance                                            Communication Communication Communication: No apparent difficulties  Cognition Arousal: Alert Behavior During Therapy: WFL for tasks assessed/performed   PT - Cognitive impairments: No apparent impairments, Difficult to assess                       PT - Cognition Comments: AxO x 3 pleasant and motivated [place] Speaking but knows some [place]. Following commands: Intact      Cueing Cueing Techniques: Verbal cues  Exercises  Total Hip Replacement TE's following HEP Handout 10 reps ankle pumps 05 reps knee presses 05 reps heel slides 05 reps SAQ's 05 reps ABD Instructed how to use a belt loop to assist  Followed by ICE     General Comments        Pertinent Vitals/Pain Pain Assessment Pain Assessment: Faces Faces Pain Scale: Hurts little more Pain Location: L hip Pain Descriptors / Indicators: Sore, Tender, Operative site guarding  Pain Intervention(s): Monitored during session, Premedicated before session, Repositioned, Ice applied    Home Living                          Prior Function            PT Goals (current goals can now be found in the care plan section) Progress towards PT goals: Progressing toward goals    Frequency           PT Plan      Co-evaluation              AM-PAC PT 6 Clicks Mobility   Outcome Measure  Help needed turning from your back to your side while in a flat bed without using bedrails?: A Little Help needed moving from lying on your back to sitting on the side of a flat bed without using bedrails?: A Little Help needed moving to and  from a bed to a chair (including a wheelchair)?: A Little Help needed standing up from a chair using your arms (e.g., wheelchair or bedside chair)?: A Little Help needed to walk in hospital room?: A Little Help needed climbing 3-5 steps with a railing? : A Lot 6 Click Score: 17    End of Session Equipment Utilized During Treatment: Gait belt   Patient left: in chair;with call bell/phone within reach;with chair alarm set Nurse Communication: Mobility status PT Visit Diagnosis: Difficulty in walking, not elsewhere classified (R26.2)     Time: 8976-8950 PT Time Calculation (min) (ACUTE ONLY): 26 min  Charges:    $Gait Training: 8-22 mins $Therapeutic Exercise: 8-22 mins PT General Charges $$ ACUTE PT VISIT: 1 Visit                     Katheryn Leap  PTA Acute  Rehabilitation Services Office M-F          [phone]

## 2024-05-25 NOTE — Anesthesia Postprocedure Evaluation (Signed)
 Anesthesia Post Note  Patient: Juan Lewis  Procedure(s) Performed: ARTHROPLASTY, HIP, TOTAL, ANTERIOR APPROACH (Left: Hip)     Patient location during evaluation: PACU Anesthesia Type: Spinal Level of consciousness: oriented and awake and alert Pain management: pain level controlled Vital Signs Assessment: post-procedure vital signs reviewed and stable Respiratory status: spontaneous breathing, respiratory function stable and patient connected to nasal cannula oxygen Cardiovascular status: blood pressure returned to baseline and stable Postop Assessment: no headache, no backache and no apparent nausea or vomiting Anesthetic complications: no   No notable events documented.  Last Vitals:  Vitals:   05/25/24 0242 05/25/24 0632  BP: 128/85 136/84  Pulse: 62 (!) 58  Resp: 18 18  Temp: 36.9 C 36.7 C  SpO2: 96% 98%    Last Pain:  Vitals:   05/25/24 9367  TempSrc: Oral  PainSc:                  [name] [name]

## 2024-05-25 NOTE — Plan of Care (Signed)
  Problem: Education: Goal: Knowledge of General Education information will improve Description: Including pain rating scale, medication(s)/side effects and non-pharmacologic comfort measures 05/25/2024 0811 by Alaina Dozier PARAS, RN Outcome: Adequate for Discharge 05/25/2024 [phone] by Alaina Dozier PARAS, RN Outcome: Progressing   Problem: Health Behavior/Discharge Planning: Goal: Ability to manage health-related needs will improve 05/25/2024 0811 by Alaina Dozier PARAS, RN Outcome: Adequate for Discharge 05/25/2024 [phone] by Alaina Dozier PARAS, RN Outcome: Progressing   Problem: Clinical Measurements: Goal: Ability to maintain clinical measurements within normal limits will improve 05/25/2024 0811 by Alaina Dozier PARAS, RN Outcome: Adequate for Discharge 05/25/2024 [phone] by Alaina Dozier PARAS, RN Outcome: Progressing Goal: Will remain free from infection 05/25/2024 0811 by Alaina Dozier PARAS, RN Outcome: Adequate for Discharge 05/25/2024 [phone] by Alaina Dozier PARAS, RN Outcome: Progressing Goal: Diagnostic test results will improve Outcome: Adequate for Discharge Goal: Respiratory complications will improve Outcome: Adequate for Discharge Goal: Cardiovascular complication will be avoided Outcome: Adequate for Discharge   Problem: Activity: Goal: Risk for activity intolerance will decrease Outcome: Adequate for Discharge   Problem: Nutrition: Goal: Adequate nutrition will be maintained Outcome: Adequate for Discharge   Problem: Coping: Goal: Level of anxiety will decrease Outcome: Adequate for Discharge   Problem: Elimination: Goal: Will not experience complications related to bowel motility Outcome: Adequate for Discharge Goal: Will not experience complications related to urinary retention Outcome: Adequate for Discharge   Problem: Pain Managment: Goal: General experience of comfort will improve and/or be controlled Outcome: Adequate for Discharge    Problem: Safety: Goal: Ability to remain free from injury will improve Outcome: Adequate for Discharge   Problem: Skin Integrity: Goal: Risk for impaired skin integrity will decrease Outcome: Adequate for Discharge   Problem: Education: Goal: Knowledge of the prescribed therapeutic regimen will improve Outcome: Adequate for Discharge Goal: Understanding of discharge needs will improve Outcome: Adequate for Discharge Goal: Individualized Educational Video(s) Outcome: Adequate for Discharge   Problem: Activity: Goal: Ability to avoid complications of mobility impairment will improve Outcome: Adequate for Discharge Goal: Ability to tolerate increased activity will improve Outcome: Adequate for Discharge   Problem: Clinical Measurements: Goal: Postoperative complications will be avoided or minimized Outcome: Adequate for Discharge   Problem: Pain Management: Goal: Pain level will decrease with appropriate interventions Outcome: Adequate for Discharge   Problem: Skin Integrity: Goal: Will show signs of wound healing Outcome: Adequate for Discharge

## 2024-05-29 ENCOUNTER — Telehealth: Payer: Self-pay | Admitting: Orthopaedic Surgery

## 2024-05-29 NOTE — Telephone Encounter (Signed)
 I would take the Colace as well as starting a stimulant laxative like Senokot and see if this will help.  He could also try magnesium  citrate and drink a bottle of that.  I would probably start with drinking magnesium  citrate and start Senokot tomorrow if he has no bowel movement still.

## 2024-05-29 NOTE — Telephone Encounter (Signed)
 Patient had left total hip with Dr. Jerri at [facility] 05-24-24.  He states since the surgery he has not been able to poop even though he has taken the Colace prescribed. Please call patient and advise today.  Best number to reach patient is [phone]

## 2024-05-29 NOTE — Telephone Encounter (Signed)
 Talked with patient and advised him of message  below per [facility].  Patient voiced that he understands.

## 2024-06-08 ENCOUNTER — Other Ambulatory Visit: Payer: Self-pay | Admitting: Physician Assistant

## 2024-06-08 ENCOUNTER — Ambulatory Visit (INDEPENDENT_AMBULATORY_CARE_PROVIDER_SITE_OTHER): Payer: Self-pay | Admitting: Physician Assistant

## 2024-06-08 DIAGNOSIS — Z96642 Presence of left artificial hip joint: Secondary | ICD-10-CM

## 2024-06-08 MED ORDER — OXYCODONE-ACETAMINOPHEN 5-325 MG PO TABS: 1.0000 | ORAL_TABLET | Freq: Four times a day (QID) | ORAL | 0 refills | Status: AC | PRN

## 2024-06-08 NOTE — Progress Notes (Signed)
 Post-Op Visit Note   Patient: Juan Lewis           Date of Birth: [birth year]           MRN: [id number] Visit Date: 06/08/2024 PCP: Pcp, No   Assessment & Plan:  Chief Complaint:  Chief Complaint  Patient presents with   Left Hip - Routine Post Op   Visit Diagnoses:  1. Status post total replacement of left hip     Plan: Patient is a pleasant 53 year old gentleman comes in today 2 weeks status post left total hip replacement 05/24/2024.  He has been doing well.  He has been compliant taking baby aspirin  DVT prophylaxis.  He has been taking Percocet and Robaxin  for pain.  He has been getting home health PT and is ambulating with a walker.  Examination of the left hip reveals a well-healing surgical incision with nylon sutures in place.  No evidence of infection or cellulitis.  Calves are soft and nontender.  He is neurovascular intact distally.  Today, sutures removed and Steri-Strips applied.  He will continue with walking for the next 4 weeks.  Continue with his baby aspirin  twice daily for another 4 weeks.  I refilled his Percocet.  He will follow-up in 4 weeks for repeat evaluation and AP pelvis x-rays.  Call with concerns or questions.  Follow-Up Instructions: Return in about 4 weeks (around 07/06/2024).   Orders:  No orders of the defined types were placed in this encounter.  No orders of the defined types were placed in this encounter.   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   Neutropenia (HCC) 06/09/2021   Prediabetes 06/09/2021    Carcinoma of prostate (HCC) 12/04/2019   Raised prostate specific antigen 09/28/2019   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   Tobacco user 12/08/2018   Atypical chest pain 12/05/2017   Past Medical History:  Diagnosis Date   Arthritis    GERD (gastroesophageal reflux disease)    hx of   History of radiation therapy    2020/2021 for vocal cord lesion and prostate cancer   Hoarseness 2020   Lesion of vocal cord 2020   Pre-diabetes    Prostate cancer (HCC) 2021    Family History  Problem Relation Age of Onset   Prostate cancer Neg Hx    Breast cancer Neg Hx    Colon cancer Neg Hx    Pancreatic cancer Neg Hx    Colon polyps Neg Hx    Esophageal cancer Neg Hx    Rectal cancer Neg Hx    Stomach cancer Neg Hx     Past Surgical History:  Procedure Laterality Date   COLONOSCOPY     ESOPHAGOGASTRODUODENOSCOPY ENDOSCOPY     MICROLARYNGOSCOPY N/A 03/23/2019   Procedure: MICROLARYNGOSCOPY WITH EXCISION OF VOCAL CORD LESION;  Surgeon: Ethyl Lonni BRAVO, MD;  Location: [facility];  Service: ENT;  Laterality: N/A;   MICROLARYNGOSCOPY N/A 07/15/2020   Procedure: DIAGNOSTIC LARYNGOSCOPY WITH MICROSCOPE AND BIOPSY;  Surgeon: Ethyl Lonni BRAVO, MD;  Location: [facility];  Service: ENT;  Laterality: N/A;   MICROLARYNGOSCOPY WITH CO2 LASER AND EXCISION OF VOCAL CORD LESION N/A 07/28/2020   Procedure: MICROLARYNGOSCOPY WITH CO2 LASER AND EXCISION OF VOCAL CORD LESION;  Surgeon: Ethyl Lonni BRAVO, MD;  Location: [facility] OR;  Service: ENT;  Laterality: N/A;   PROSTATE BIOPSY     TOTAL HIP ARTHROPLASTY Left 05/24/2024   Procedure: ARTHROPLASTY, HIP, TOTAL, ANTERIOR APPROACH;  Surgeon: Jerri Kay HERO, MD;  Location: [facility];  Service: Orthopedics;  Laterality: Left;   Social History   Occupational History   Not on file  Tobacco Use   Smoking status: Former    Current packs/day: 0.00    Average packs/day: 0.5 packs/day for 30.5 years (15.3 ttl pk-yrs)     Types: Cigarettes    Start date: 55    Quit date: 04/20/2019    Years since quitting: 5.1   Smokeless tobacco: Never   Tobacco comments:    he quit when he started radiation. 06/15/19  Vaping Use   Vaping status: Never Used  Substance and Sexual Activity   Alcohol  use: Not Currently    Alcohol /week: 1.0 - 8.0 standard drink of alcohol     Types: 1 - 8 Glasses of wine per week    Comment: wine on weekend 1-2 botles   Drug use: No   Sexual activity: Not Currently

## 2024-06-13 ENCOUNTER — Telehealth: Payer: Self-pay | Admitting: Orthopaedic Surgery

## 2024-06-13 NOTE — Telephone Encounter (Signed)
 Completed. Ready at front desk for patient to pick up. Patient advised.

## 2024-06-13 NOTE — Telephone Encounter (Signed)
 Pt submiteed medical release form, Short term forms, and $20.00 payment

## 2024-06-14 ENCOUNTER — Other Ambulatory Visit: Payer: Self-pay | Admitting: Physician Assistant

## 2024-06-20 ENCOUNTER — Other Ambulatory Visit: Payer: Self-pay | Admitting: Physician Assistant

## 2024-06-20 ENCOUNTER — Telehealth: Payer: Self-pay | Admitting: Orthopaedic Surgery

## 2024-06-20 MED ORDER — HYDROCODONE-ACETAMINOPHEN 5-325 MG PO TABS: 1.0000 | ORAL_TABLET | Freq: Two times a day (BID) | ORAL | 0 refills | Status: DC | PRN

## 2024-06-20 NOTE — Telephone Encounter (Signed)
Weaning to norco and sent to pharmacy on file

## 2024-06-20 NOTE — Telephone Encounter (Signed)
 Patient called and needs a refill on Oxycodone . CB#[phone]

## 2024-06-20 NOTE — Telephone Encounter (Signed)
 Spoke with patient. He has been advised. He has been unable to have a BM since this past Saturday. It was explained to him that weaning off the pain medicine will help. He can continue to take the colace as prescribed, as well as try magnesium  citrate from the pharmacy. Patient states understanding.

## 2024-06-25 ENCOUNTER — Telehealth: Payer: Self-pay | Admitting: Orthopaedic Surgery

## 2024-06-25 ENCOUNTER — Other Ambulatory Visit: Payer: Self-pay | Admitting: Physician Assistant

## 2024-06-25 MED ORDER — HYDROCODONE-ACETAMINOPHEN 5-325 MG PO TABS: 1.0000 | ORAL_TABLET | Freq: Two times a day (BID) | ORAL | 0 refills | Status: AC | PRN

## 2024-06-25 MED ORDER — METHOCARBAMOL 750 MG PO TABS: 750.0000 mg | ORAL_TABLET | Freq: Three times a day (TID) | ORAL | 2 refills | Status: AC | PRN

## 2024-06-25 NOTE — Telephone Encounter (Signed)
 Patient called. He needs a refill on hydrocodone  and robaxin .

## 2024-06-25 NOTE — Telephone Encounter (Signed)
 sent

## 2024-07-04 NOTE — Progress Notes (Signed)
 Pain issues, if any: Patient denies Using a feeding tube?: None Weight changes, if any: Patient has lost weight used to be 162, now 156. Swallowing issues, if any: None Smoking or chewing tobacco? None Using fluoride toothpaste daily? None Last ENT visit was on: May 2025 Other notable issues, if any: None BP (P) 118/87 (BP Location: Left Arm, Patient Position: Sitting)   Pulse (!) (P) 56   Temp (!) (P) 97.1 F (36.2 C) (Temporal)   Resp (P) 18   Ht (P) 5' 8 (1.727 m)   Wt (P) 156 lb (70.8 kg)   SpO2 (P) 100%   BMI (P) 23.72 kg/m    Wt Readings from Last 3 Encounters:  07/06/24 (P) 156 lb (70.8 kg)  05/24/24 153 lb (69.4 kg)  05/17/24 153 lb (69.4 kg)

## 2024-07-06 ENCOUNTER — Ambulatory Visit: Admitting: Physician Assistant

## 2024-07-06 ENCOUNTER — Ambulatory Visit
Admission: RE | Admit: 2024-07-06 | Discharge: 2024-07-06 | Disposition: A | Discharge status: Home / Self Care | Payer: Self-pay | Source: Ambulatory Visit | Attending: Radiation Oncology | Admitting: Radiation Oncology

## 2024-07-06 ENCOUNTER — Other Ambulatory Visit: Payer: Self-pay

## 2024-07-06 ENCOUNTER — Ambulatory Visit
Admission: RE | Admit: 2024-07-06 | Discharge: 2024-07-06 | Disposition: A | Discharge status: Home / Self Care | Source: Ambulatory Visit | Attending: Radiation Oncology | Admitting: Radiation Oncology

## 2024-07-06 ENCOUNTER — Encounter: Payer: Self-pay | Admitting: Physician Assistant

## 2024-07-06 ENCOUNTER — Other Ambulatory Visit (INDEPENDENT_AMBULATORY_CARE_PROVIDER_SITE_OTHER): Payer: Self-pay

## 2024-07-06 DIAGNOSIS — C61 Malignant neoplasm of prostate: Secondary | ICD-10-CM | POA: Insufficient documentation

## 2024-07-06 DIAGNOSIS — Z96642 Presence of left artificial hip joint: Secondary | ICD-10-CM | POA: Diagnosis not present

## 2024-07-06 DIAGNOSIS — Z7982 Long term (current) use of aspirin: Secondary | ICD-10-CM | POA: Insufficient documentation

## 2024-07-06 DIAGNOSIS — Z923 Personal history of irradiation: Secondary | ICD-10-CM | POA: Diagnosis not present

## 2024-07-06 DIAGNOSIS — Z791 Long term (current) use of non-steroidal anti-inflammatories (NSAID): Secondary | ICD-10-CM | POA: Insufficient documentation

## 2024-07-06 DIAGNOSIS — C320 Malignant neoplasm of glottis: Secondary | ICD-10-CM | POA: Insufficient documentation

## 2024-07-06 DIAGNOSIS — Z1329 Encounter for screening for other suspected endocrine disorder: Secondary | ICD-10-CM

## 2024-07-06 LAB — T4, FREE: Free T4: 0.63 ng/dL (ref 0.61–1.12)

## 2024-07-06 LAB — TSH: TSH: 2.21 u[IU]/mL (ref 0.350–4.500)

## 2024-07-06 NOTE — Progress Notes (Signed)
 Radiation Oncology         [phone] ________________________________  Name: Juan Lewis MRN: [id number]  Date: 07/06/2024  DOB: [birth year]  Follow-Up Visit Note, in person, outpatient   CC: Pcp, No  Juan Lewis, *  Diagnosis and Prior Radiotherapy:       ICD-10-CM   1. 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 Lonni Juan Microlaryngoscopy with CO2 laser excision of right anterior true vocal cord, anterior commissure and superficial left anterior true vocal cord. --FINAL MICROSCOPIC DIAGNOSIS:  A. VOCAL CORD, LEFT ANTERIOR, BIOPSY:  - Squamous mucosa with atypia concerning for low grade dysplasia - Negative for carcinoma  B. 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   Pain issues, if any: Patient denies Using a feeding tube?: None Weight changes, if any: Patient has lost weight used to be 162, now 156.  He reports his appetite is not quite what it used to be.  He is also recovering from a recent hip replacement. Swallowing issues, if any: None Smoking or chewing tobacco? None Using fluoride toothpaste daily? None Last ENT visit was on: May 2025 Other notable issues, if any: None-he reports baseline hoarseness that has not changed BP (P) 118/87 (BP Location: Left Arm, Patient Position: Sitting)   Pulse (!) (P) 56   Temp (!) (P) 97.1 F (36.2 C) (Temporal)   Resp (P) 18   Ht (P) 5' 8 (1.727 m)   Wt (P) 156 lb (70.8 kg)   SpO2 (P) 100%   BMI (P) 23.72 kg/m     Wt Readings from Last 3 Encounters:  07/06/24 (P) 156 lb (70.8 kg)  05/24/24 153 lb (69.4 kg)  05/17/24 153 lb (69.4 kg)     ALLERGIES:  has no known allergies.  Meds: Current Outpatient Medications  Medication Sig Dispense Refill   artificial tears (LACRILUBE) OINT ophthalmic ointment Place into the left eye at bedtime as needed for dry eyes. Please place the ophthalmic ointment in your eye and then tape your eyelid closed at night. (Patient not taking: Reported on 05/11/2024) 7 g 0   aspirin  (ASPIRIN  81) 81 MG chewable tablet Chew 1 tablet (81 mg total) by mouth 2 (two) times daily. To be taken after surgery to prevent blood clots 84 tablet 0   docusate sodium  (COLACE) 100 MG capsule Take 1 capsule (100 mg total) by mouth daily as needed. 30 capsule 2   HYDROcodone -acetaminophen  (NORCO/VICODIN) 5-325 MG tablet Take 1-2 tablets by mouth 2 (two) times daily as needed. 20 tablet 0   loratadine  (CLARITIN ) 10 MG tablet Take 1 tablet (10 mg total) by mouth daily as needed for allergies or rhinitis. (Patient not taking: Reported on 05/11/2024) 30 tablet 0   meloxicam  (MOBIC ) 15 MG tablet TAKE 1 TABLET(15 MG) BY MOUTH DAILY (Patient not taking: Reported on 05/11/2024) 30 tablet 0   methocarbamol  (ROBAXIN ) 750 MG tablet Take 1 tablet (750 mg total) by mouth 3 (three) times daily as needed. 30 tablet 2   ondansetron  (  ZOFRAN ) 4 MG tablet TAKE 1 TABLET(4 MG) BY MOUTH EVERY 8 HOURS AS NEEDED FOR NAUSEA OR VOMITING 40 tablet 0   oxyCODONE -acetaminophen  (PERCOCET) 5-325 MG tablet Take 1-2 tablets by mouth every 6 (six) hours as needed. To be taken after surgery (Patient not taking: Reported on 07/06/2024) 40 tablet 0   No current facility-administered medications for this encounter.    Physical Findings: The patient is in no acute distress. Patient is alert and oriented. Wt Readings from Last 3 Encounters:  07/06/24 (P) 156 lb (70.8 kg)  05/24/24 153 lb (69.4 kg)  05/17/24 153 lb (69.4 kg)    height  is 5' 8 (1.727 m) (pended) and weight is 156 lb (70.8 kg) (pended). His temporal temperature is 97.1 F (36.2 C) (abnormal, pended). His blood pressure is 118/87 (pended) and his pulse is 56 (abnormal, pended). His respiration is 18 (pended) and oxygen saturation is 100% (pended). .  General: Alert and oriented, in no acute distress HEENT: Head is normocephalic.  Oral cavity and oropharynx  notable for no lesions Neck: No palpable adenopathy Chest clear to auscultation bilaterally Heart regular in rate and rhythm Abdomen soft and nontender Extremities without edema Musculoskeletal exam reveals that he is well-nourished.  He walks with a cane due to recent  hip replacement Skin: Satisfactory healing of skin of neck skin with residual hyperpigmentation Psychiatric: Judgment and insight are intact. Affect is appropriate.  Laryngoscopy PROCEDURE NOTE:  After obtaining consent and anesthetizing the nasal cavity with topical oxymetolazine, the flexible endoscope was coated with lidocaine  gel and introduced and passed through the nasal cavity.  The nasopharynx, oropharynx, larynx, and hypopharynx were then examined. No lesions appreciated in nasopharynx, oropharynx, hypopharynx, or larynx. True cords are without lesions or paralysis.  There is incomplete glottic closure anteriorly. He tolerated this well   Lab Findings: Lab Results  Component Value Date   WBC 5.1 05/17/2024   HGB 13.0 05/17/2024   HCT 40.9 05/17/2024   MCV 91.7 05/17/2024   PLT 276 05/17/2024    Lab Results  Component Value Date   TSH 5.042 (H) 07/08/2023   FREE T4  0.73  Radiographic Findings: No results found.  Impression/Plan:    1) Head and Neck Cancer Status: NED (see Laryngoscopy PROCEDURE NOTE)  2) Nutritional Status: He has lost a little bit of weight but this may be impacted by his recent hip replacement.  3) Risk Factors: The patient has been educated about risk factors including alcohol  and tobacco  abuse; they understand that avoidance of alcohol  and tobacco is important to prevent recurrences as well as other cancers - not smoking any substances whatsoever.  4) Swallowing: No issues  5)  Thyroid  function: Today's labs are pending Lab Results  Component Value Date   TSH 5.042 (H) 07/08/2023      6) F/u with me in 12 mo for routine follow up.  Follow-up with ENT in the interim.    On date of service, in total, I spent 30 minutes on this encounter. Patient was seen in person.     _____________________________________      Lauraine Golden, MD

## 2024-07-06 NOTE — Progress Notes (Signed)
 Post-Op Visit Note   Patient: Juan Lewis           Date of Birth: [birth year]           MRN: [id number] Visit Date: 07/06/2024 PCP: Pcp, No   Assessment & Plan:  Chief Complaint:  Chief Complaint  Patient presents with   Left Hip - Follow-up    Left THA 05/24/2024   Visit Diagnoses:  1. Status post total replacement of left hip     Plan: Patient is a pleasant 53 year old gentleman who comes in today 6 weeks status post left total hip replacement 05/24/2024.  He is doing great.  He is in no pain.  He has been compliant taking aspirin  twice daily for DVT prophylaxis.  He is walking with a 4-prong cane as he says he does not quite feel 100 send stable on his feet.  Examination of the left hip reveals painless hip flexion and logroll.  He is neurovascularly intact distally.  At this point, we have discussed continue with his home exercise program as well as initiating formal physical therapy to help with strengthening exercises.  He works as a Museum/gallery exhibitions officer and I do not feel as though he is ready to return to this job yet.  He will follow-up with us  in 6 weeks for recheck.  If he is ready to return to work prior to his appointment he will call and let us  know.  Call with concerns or questions.  Follow-Up Instructions: Return in about 6 weeks (around 08/17/2024).   Orders:  Orders Placed This Encounter  Procedures   XR Pelvis 1-2 Views   No orders of the defined types were placed in this encounter.   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   Neutropenia 06/09/2021   Prediabetes 06/09/2021   Carcinoma of prostate (HCC) 12/04/2019   Raised prostate specific antigen 09/28/2019   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   Tobacco user 12/08/2018   Atypical chest pain 12/05/2017   Past Medical History:  Diagnosis Date   Arthritis    GERD (gastroesophageal reflux disease)    hx of   History of radiation therapy    2020/2021 for vocal cord lesion and prostate cancer   Hoarseness 2020   Lesion of vocal cord 2020   Pre-diabetes    Prostate cancer (HCC) 2021    Family History  Problem Relation Age of Onset   Prostate cancer Neg Hx    Breast cancer Neg Hx    Colon cancer Neg Hx    Pancreatic cancer Neg Hx    Colon polyps Neg Hx    Esophageal cancer Neg Hx    Rectal cancer Neg Hx    Stomach cancer Neg Hx     Past Surgical History:  Procedure Laterality Date   COLONOSCOPY     ESOPHAGOGASTRODUODENOSCOPY ENDOSCOPY     MICROLARYNGOSCOPY N/A 03/23/2019   Procedure: MICROLARYNGOSCOPY WITH EXCISION OF VOCAL CORD LESION;  Surgeon: Ethyl Lonni BRAVO, MD;  Location: [facility];  Service: ENT;  Laterality: N/A;   MICROLARYNGOSCOPY N/A 07/15/2020   Procedure: DIAGNOSTIC LARYNGOSCOPY WITH MICROSCOPE AND BIOPSY;  Surgeon: Ethyl Lonni BRAVO, MD;  Location: [facility];  Service: ENT;  Laterality: N/A;   MICROLARYNGOSCOPY WITH CO2 LASER AND EXCISION OF VOCAL CORD LESION N/A 07/28/2020   Procedure: MICROLARYNGOSCOPY WITH CO2 LASER AND EXCISION OF VOCAL CORD LESION;  Surgeon: Ethyl Lonni BRAVO, MD;  Location: [facility] OR;  Service: ENT;  Laterality: N/A;   PROSTATE BIOPSY     TOTAL HIP ARTHROPLASTY Left 05/24/2024   Procedure: ARTHROPLASTY, HIP, TOTAL, ANTERIOR APPROACH;  Surgeon: Jerri Kay HERO, MD;  Location: [facility];  Service: Orthopedics;  Laterality: Left;   Social History   Occupational History   Not on file  Tobacco Use    Smoking status: Former    Current packs/day: 0.00    Average packs/day: 0.5 packs/day for 30.5 years (15.3 ttl pk-yrs)    Types: Cigarettes    Start date: 21    Quit date: 04/20/2019    Years since quitting: 5.2   Smokeless tobacco: Never   Tobacco comments:    he quit when he started radiation. 06/15/19  Vaping Use   Vaping status: Never Used  Substance and Sexual Activity   Alcohol  use: Not Currently    Alcohol /week: 1.0 - 8.0 standard drink of alcohol     Types: 1 - 8 Glasses of wine per week    Comment: wine on weekend 1-2 botles   Drug use: No   Sexual activity: Not Currently

## 2024-07-09 NOTE — Progress Notes (Signed)
 Oncology Nurse Navigator Documentation   Per patient's 07/06/24 post-treatment follow-up with Dr. Izell, sent fax to [facility] ENT Scheduling with request Mr. Bourbeau be contacted and scheduled for routine post-RT follow-up in 6 months with Dr. Llewellyn.  Notification of successful fax transmission received.   Delon Jefferson RN, BSN, OCN Head & Neck Oncology Nurse Navigator [facility] Phone # [phone]  Fax # [phone]

## 2024-07-13 NOTE — Addendum Note (Signed)
 Encounter addended by: Mohamed-Medani, Elverna LABOR, RN on: 07/13/2024 12:05 PM  Actions taken: Charge Capture section accepted

## 2024-07-25 ENCOUNTER — Telehealth: Payer: Self-pay | Admitting: Orthopaedic Surgery

## 2024-07-25 NOTE — Telephone Encounter (Signed)
 Pt called stating that he needs a letter to return back to work. Call back number is [phone]

## 2024-07-26 NOTE — Telephone Encounter (Signed)
 yes

## 2024-07-26 NOTE — Telephone Encounter (Signed)
 Left THA 05/24/2024   Okay to do note?

## 2024-07-26 NOTE — Telephone Encounter (Signed)
 Spoke with patient. He did not want a letter to return without seeing the doctor first. Scheduled for next week.

## 2024-08-02 ENCOUNTER — Ambulatory Visit (INDEPENDENT_AMBULATORY_CARE_PROVIDER_SITE_OTHER): Admitting: Orthopaedic Surgery

## 2024-08-02 DIAGNOSIS — Z96642 Presence of left artificial hip joint: Secondary | ICD-10-CM

## 2024-08-02 NOTE — Progress Notes (Signed)
 Post-Op Visit Note   Patient: Juan Lewis           Date of Birth: [birth year]           MRN: [id number]  Visit Date: 08/02/2024 PCP: Pcp, No   Assessment & Plan:  Chief Complaint:  Chief Complaint  Patient presents with   Left Hip - Follow-up    Left THA 05/24/2024   Visit Diagnoses:  1. Status post total replacement of left hip     Plan: History of Present Illness Juan Lewis is a 53 year old male who presents for a follow-up visit after left hip surgery.  He is 10 weeks post-operation from left THA performed on August 14th. He experiences no pain and can ascend stairs, though he has some difficulty with prolonged activity. He is eager to return to work and requests a note indicating 'no restrictions' for his job starting Monday. He is satisfied with the outcome of the surgery.  Physical Exam SKIN: Scar is well-healed.  Excellent gait pattern.  Fluid painless range of motion of the hip  Assessment and Plan 10 weeks status post left hip surgery Surgical site healed, no pain, mild weakness improving. - Schedule follow-up in four months for evaluation and x-ray. - Provide work note for return to regular work with no restrictions starting Monday. - Advise to call if any problems arise.  Follow-Up Instructions: Return in about 4 months (around 12/03/2024).   Orders:  No orders of the defined types were placed in this encounter.  No orders of the defined types were placed in this encounter.   Imaging: No results found.  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   Tobacco user 12/08/2018   Atypical chest pain 12/05/2017   Past Medical History:  Diagnosis Date   Arthritis    GERD (gastroesophageal reflux disease)    hx of   History of radiation therapy    2020/2021 for vocal cord lesion and prostate cancer   Hoarseness 2020   Lesion of vocal cord 2020   Pre-diabetes    Prostate cancer (HCC) 2021    Family History  Problem Relation Age of Onset   Prostate cancer Neg Hx    Breast cancer Neg Hx    Colon cancer Neg Hx    Pancreatic cancer Neg Hx    Colon polyps Neg Hx    Esophageal cancer Neg Hx    Rectal cancer Neg Hx    Stomach cancer Neg Hx     Past Surgical History:  Procedure Laterality Date   COLONOSCOPY     ESOPHAGOGASTRODUODENOSCOPY ENDOSCOPY     MICROLARYNGOSCOPY N/A 03/23/2019   Procedure: MICROLARYNGOSCOPY WITH EXCISION OF VOCAL CORD LESION;  Surgeon: Ethyl Lonni BRAVO, MD;  [facility];  Service: ENT;  Laterality: N/A;   MICROLARYNGOSCOPY N/A 07/15/2020   Procedure: DIAGNOSTIC LARYNGOSCOPY WITH MICROSCOPE AND BIOPSY;  Surgeon: Ethyl Lonni  E, MD;  Location: [facility];  Service: ENT;  Laterality: N/A;   MICROLARYNGOSCOPY WITH CO2 LASER AND EXCISION OF VOCAL CORD LESION N/A 07/28/2020   Procedure: MICROLARYNGOSCOPY WITH CO2 LASER AND EXCISION OF VOCAL CORD LESION;  Surgeon: Ethyl Lonni BRAVO, MD;  [facility];  Service: ENT;  Laterality: N/A;   PROSTATE BIOPSY     TOTAL HIP ARTHROPLASTY Left 05/24/2024   Procedure: ARTHROPLASTY, HIP, TOTAL, ANTERIOR APPROACH;  Surgeon: Jerri Kay HERO, MD;  Location: [facility];  Service: Orthopedics;  Laterality: Left;   Social History   Occupational History   Not on file  Tobacco Use   Smoking status: Former    Current packs/day: 0.00    Average  packs/day: 0.5 packs/day for 30.5 years (15.3 ttl pk-yrs)    Types: Cigarettes    Start date: 82    Quit date: 04/20/2019    Years since quitting: 5.2   Smokeless tobacco: Never   Tobacco comments:    he quit when he started radiation. 06/15/19  Vaping Use   Vaping status: Never Used  Substance and Sexual Activity   Alcohol  use: Not Currently    Alcohol /week: 1.0 - 8.0 standard drink of alcohol     Types: 1 - 8 Glasses of wine per week    Comment: wine on weekend 1-2 botles   Drug use: No   Sexual activity: Not Currently

## 2024-08-13 ENCOUNTER — Encounter: Payer: Self-pay | Admitting: Radiology

## 2024-11-12 ENCOUNTER — Other Ambulatory Visit (HOSPITAL_COMMUNITY): Payer: Self-pay | Admitting: Urology

## 2024-11-12 DIAGNOSIS — R9721 Rising PSA following treatment for malignant neoplasm of prostate: Secondary | ICD-10-CM

## 2024-11-14 ENCOUNTER — Telehealth: Payer: Self-pay | Admitting: Orthopaedic Surgery

## 2024-11-14 NOTE — Telephone Encounter (Signed)
 Notified patient if was up front and ready for pick up.

## 2024-11-14 NOTE — Telephone Encounter (Signed)
 Ok for 6 months

## 2024-11-14 NOTE — Telephone Encounter (Signed)
 Patient Requested a handicap renewal form please call when ready

## 2024-11-21 ENCOUNTER — Encounter (HOSPITAL_COMMUNITY)

## 2024-12-04 ENCOUNTER — Ambulatory Visit: Admitting: Orthopaedic Surgery

## 2025-06-18 ENCOUNTER — Ambulatory Visit: Admitting: Radiology
